# Patient Record
Sex: Male | Born: 1950
Health system: Southern US, Community
[De-identification: ages and names within clinical notes are randomized; demographics above are authoritative.]

## PROBLEM LIST (undated history)

## (undated) DIAGNOSIS — K567 Ileus, unspecified: Secondary | ICD-10-CM

## (undated) DIAGNOSIS — M1990 Unspecified osteoarthritis, unspecified site: Secondary | ICD-10-CM

## (undated) DIAGNOSIS — G4733 Obstructive sleep apnea (adult) (pediatric): Secondary | ICD-10-CM

## (undated) DIAGNOSIS — Q859 Phakomatosis, unspecified: Secondary | ICD-10-CM

## (undated) DIAGNOSIS — D638 Anemia in other chronic diseases classified elsewhere: Secondary | ICD-10-CM

## (undated) DIAGNOSIS — M722 Plantar fascial fibromatosis: Secondary | ICD-10-CM

## (undated) DIAGNOSIS — K219 Gastro-esophageal reflux disease without esophagitis: Secondary | ICD-10-CM

## (undated) DIAGNOSIS — S46119A Strain of muscle, fascia and tendon of long head of biceps, unspecified arm, initial encounter: Secondary | ICD-10-CM

## (undated) DIAGNOSIS — K648 Other hemorrhoids: Secondary | ICD-10-CM

## (undated) DIAGNOSIS — M75101 Unspecified rotator cuff tear or rupture of right shoulder, not specified as traumatic: Secondary | ICD-10-CM

## (undated) DIAGNOSIS — Z86718 Personal history of other venous thrombosis and embolism: Secondary | ICD-10-CM

## (undated) HISTORY — DX: Unspecified osteoarthritis, unspecified site: M19.90

## (undated) HISTORY — DX: Plantar fascial fibromatosis: M72.2

## (undated) HISTORY — DX: Strain of muscle, fascia and tendon of long head of biceps, unspecified arm, initial encounter: S46.119A

## (undated) HISTORY — DX: Other hemorrhoids: K64.8

## (undated) HISTORY — DX: Unspecified rotator cuff tear or rupture of right shoulder, not specified as traumatic: M75.101

## (undated) HISTORY — PX: APPENDECTOMY: SHX54

## (undated) HISTORY — DX: Obstructive sleep apnea (adult) (pediatric): G47.33

## (undated) HISTORY — DX: Phakomatosis, unspecified: Q85.9

## (undated) HISTORY — DX: Gastro-esophageal reflux disease without esophagitis: K21.9

## (undated) HISTORY — PX: DENTAL SURGERY: SHX609

## (undated) HISTORY — DX: Ileus, unspecified: K56.7

## (undated) HISTORY — DX: Personal history of other venous thrombosis and embolism: Z86.718

## (undated) HISTORY — DX: Anemia in other chronic diseases classified elsewhere: D63.8

---

## 1984-04-17 HISTORY — PX: ORIF ULNAR FRACTURE: SHX5417

## 2000-12-07 ENCOUNTER — Emergency Department (HOSPITAL_COMMUNITY): Admission: EM | Admit: 2000-12-07 | Discharge: 2000-12-07 | Payer: Self-pay | Admitting: Emergency Medicine

## 2004-07-01 ENCOUNTER — Ambulatory Visit: Payer: Self-pay | Admitting: Internal Medicine

## 2004-12-15 ENCOUNTER — Ambulatory Visit: Payer: Self-pay | Admitting: Internal Medicine

## 2004-12-26 ENCOUNTER — Ambulatory Visit: Payer: Self-pay | Admitting: Internal Medicine

## 2005-03-14 ENCOUNTER — Ambulatory Visit: Payer: Self-pay | Admitting: Internal Medicine

## 2005-03-20 ENCOUNTER — Ambulatory Visit: Payer: Self-pay | Admitting: Internal Medicine

## 2005-03-22 ENCOUNTER — Ambulatory Visit: Payer: Self-pay | Admitting: Internal Medicine

## 2005-06-02 ENCOUNTER — Ambulatory Visit: Payer: Self-pay | Admitting: Internal Medicine

## 2005-09-15 ENCOUNTER — Ambulatory Visit: Payer: Self-pay | Admitting: Internal Medicine

## 2005-10-25 ENCOUNTER — Ambulatory Visit: Payer: Self-pay | Admitting: Pulmonary Disease

## 2005-11-06 ENCOUNTER — Ambulatory Visit (HOSPITAL_BASED_OUTPATIENT_CLINIC_OR_DEPARTMENT_OTHER): Admission: RE | Admit: 2005-11-06 | Discharge: 2005-11-06 | Payer: Self-pay | Admitting: Pulmonary Disease

## 2005-11-06 ENCOUNTER — Encounter: Payer: Self-pay | Admitting: Pulmonary Disease

## 2005-11-14 ENCOUNTER — Ambulatory Visit: Payer: Self-pay | Admitting: Pulmonary Disease

## 2005-11-17 ENCOUNTER — Ambulatory Visit: Payer: Self-pay | Admitting: Pulmonary Disease

## 2005-12-08 ENCOUNTER — Ambulatory Visit: Payer: Self-pay | Admitting: Pulmonary Disease

## 2006-03-22 ENCOUNTER — Ambulatory Visit: Payer: Self-pay | Admitting: Internal Medicine

## 2006-03-22 LAB — CONVERTED CEMR LAB
ALT: 39 units/L (ref 0–40)
AST: 27 units/L (ref 0–37)
Albumin: 3.8 g/dL (ref 3.5–5.2)
Alkaline Phosphatase: 65 units/L (ref 39–117)
BUN: 20 mg/dL (ref 6–23)
Basophils Absolute: 0 10*3/uL (ref 0.0–0.1)
Basophils Relative: 0.7 % (ref 0.0–1.0)
Bilirubin Urine: NEGATIVE
CO2: 29 meq/L (ref 19–32)
Calcium: 9.6 mg/dL (ref 8.4–10.5)
Chloride: 107 meq/L (ref 96–112)
Chol/HDL Ratio, serum: 3.3
Cholesterol: 212 mg/dL (ref 0–200)
Creatinine, Ser: 1.1 mg/dL (ref 0.4–1.5)
Eosinophil percent: 6.7 % — ABNORMAL HIGH (ref 0.0–5.0)
GFR calc non Af Amer: 74 mL/min
Glomerular Filtration Rate, Af Am: 90 mL/min/{1.73_m2}
Glucose, Bld: 82 mg/dL (ref 70–99)
HCT: 42.4 % (ref 39.0–52.0)
HDL: 65.1 mg/dL (ref >39.0)
Hemoglobin, Urine: NEGATIVE
Hemoglobin: 13.9 g/dL (ref 13.0–17.0)
Ketones, ur: NEGATIVE mg/dL
LDL DIRECT: 141.4 mg/dL
Leukocytes, UA: NEGATIVE
Lymphocytes Relative: 46 % (ref 12.0–46.0)
MCHC: 32.9 g/dL (ref 30.0–36.0)
MCV: 87.1 fL (ref 78.0–100.0)
Monocytes Absolute: 0.5 10*3/uL (ref 0.2–0.7)
Monocytes Relative: 13 % — ABNORMAL HIGH (ref 3.0–11.0)
Neutro Abs: 1.2 10*3/uL — ABNORMAL LOW (ref 1.4–7.7)
Neutrophils Relative %: 33.6 % — ABNORMAL LOW (ref 43.0–77.0)
Nitrite: NEGATIVE
PSA: 0.48 ng/mL (ref 0.10–4.00)
Platelets: 293 10*3/uL (ref 150–400)
Potassium: 4.2 meq/L (ref 3.5–5.1)
RBC: 4.87 M/uL (ref 4.22–5.81)
RDW: 12.8 % (ref 11.5–14.6)
Sodium: 140 meq/L (ref 135–145)
Specific Gravity, Urine: 1.025 (ref 1.000–1.03)
TSH: 1.24 microintl units/mL (ref 0.35–5.50)
Total Bilirubin: 1.1 mg/dL (ref 0.3–1.2)
Total Protein, Urine: NEGATIVE mg/dL
Total Protein: 6.9 g/dL (ref 6.0–8.3)
Triglyceride fasting, serum: 38 mg/dL (ref 0–149)
Urine Glucose: NEGATIVE mg/dL
Urobilinogen, UA: 0.2 (ref 0.0–1.0)
VLDL: 8 mg/dL (ref 0–40)
WBC: 3.6 10*3/uL — ABNORMAL LOW (ref 4.5–10.5)
pH: 7 (ref 5.0–8.0)

## 2006-03-27 ENCOUNTER — Ambulatory Visit: Payer: Self-pay | Admitting: Internal Medicine

## 2006-09-06 ENCOUNTER — Ambulatory Visit: Payer: Self-pay | Admitting: Internal Medicine

## 2006-09-11 ENCOUNTER — Ambulatory Visit: Payer: Self-pay | Admitting: Internal Medicine

## 2006-09-11 LAB — CONVERTED CEMR LAB
ALT: 27 units/L (ref 0–40)
AST: 25 units/L (ref 0–37)
Albumin: 3.9 g/dL (ref 3.5–5.2)
Alkaline Phosphatase: 64 units/L (ref 39–117)
BUN: 23 mg/dL (ref 6–23)
Basophils Absolute: 0 10*3/uL (ref 0.0–0.1)
Basophils Relative: 0.5 % (ref 0.0–1.0)
Bilirubin Urine: NEGATIVE
Bilirubin, Direct: 0.1 mg/dL (ref 0.0–0.3)
CO2: 31 meq/L (ref 19–32)
Calcium: 9.8 mg/dL (ref 8.4–10.5)
Chloride: 108 meq/L (ref 96–112)
Creatinine, Ser: 1.1 mg/dL (ref 0.4–1.5)
Eosinophils Absolute: 0.4 10*3/uL (ref 0.0–0.6)
Eosinophils Relative: 7.8 % — ABNORMAL HIGH (ref 0.0–5.0)
GFR calc Af Amer: 90 mL/min
GFR calc non Af Amer: 74 mL/min
Glucose, Bld: 88 mg/dL (ref 70–99)
HCT: 40.8 % (ref 39.0–52.0)
Hemoglobin, Urine: NEGATIVE
Hemoglobin: 13.7 g/dL (ref 13.0–17.0)
Leukocytes, UA: NEGATIVE
Lymphocytes Relative: 31.8 % (ref 12.0–46.0)
MCHC: 33.7 g/dL (ref 30.0–36.0)
MCV: 84.8 fL (ref 78.0–100.0)
Monocytes Absolute: 0.5 10*3/uL (ref 0.2–0.7)
Monocytes Relative: 10.6 % (ref 3.0–11.0)
Neutro Abs: 2.3 10*3/uL (ref 1.4–7.7)
Neutrophils Relative %: 49.3 % (ref 43.0–77.0)
Nitrite: NEGATIVE
Platelets: 281 10*3/uL (ref 150–400)
Potassium: 3.7 meq/L (ref 3.5–5.1)
RBC: 4.8 M/uL (ref 4.22–5.81)
RDW: 13 % (ref 11.5–14.6)
Sodium: 144 meq/L (ref 135–145)
Specific Gravity, Urine: >=1.03 (ref 1.000–1.03)
TSH: 0.87 microintl units/mL (ref 0.35–5.50)
Total Bilirubin: 1.2 mg/dL (ref 0.3–1.2)
Total CK: 183 units/L (ref 7–195)
Total Protein, Urine: NEGATIVE mg/dL
Total Protein: 7.2 g/dL (ref 6.0–8.3)
Urine Glucose: NEGATIVE mg/dL
Urobilinogen, UA: 0.2 (ref 0.0–1.0)
WBC: 4.7 10*3/uL (ref 4.5–10.5)
pH: 5.5 (ref 5.0–8.0)

## 2006-10-05 ENCOUNTER — Ambulatory Visit: Payer: Self-pay | Admitting: Internal Medicine

## 2006-11-21 ENCOUNTER — Ambulatory Visit: Payer: Self-pay | Admitting: Gastroenterology

## 2006-11-28 ENCOUNTER — Ambulatory Visit: Payer: Self-pay | Admitting: Gastroenterology

## 2006-11-28 LAB — HM COLONOSCOPY: HM Colonoscopy: NORMAL

## 2007-04-16 ENCOUNTER — Ambulatory Visit: Payer: Self-pay | Admitting: Internal Medicine

## 2007-04-16 LAB — CONVERTED CEMR LAB
ALT: 28 units/L (ref 0–53)
AST: 23 units/L (ref 0–37)
Albumin: 3.4 g/dL — ABNORMAL LOW (ref 3.5–5.2)
Alkaline Phosphatase: 62 units/L (ref 39–117)
BUN: 15 mg/dL (ref 6–23)
Basophils Absolute: 0 10*3/uL (ref 0.0–0.1)
Basophils Relative: 0.8 % (ref 0.0–1.0)
Bilirubin Urine: NEGATIVE
Bilirubin, Direct: 0.1 mg/dL (ref 0.0–0.3)
CO2: 31 meq/L (ref 19–32)
Calcium: 9.3 mg/dL (ref 8.4–10.5)
Chloride: 108 meq/L (ref 96–112)
Cholesterol: 171 mg/dL (ref 0–200)
Creatinine, Ser: 1 mg/dL (ref 0.4–1.5)
Eosinophils Absolute: 0.3 10*3/uL (ref 0.0–0.6)
Eosinophils Relative: 5.4 % — ABNORMAL HIGH (ref 0.0–5.0)
GFR calc Af Amer: 100 mL/min
GFR calc non Af Amer: 83 mL/min
Glucose, Bld: 90 mg/dL (ref 70–99)
HCT: 35.7 % — ABNORMAL LOW (ref 39.0–52.0)
HDL: 43.7 mg/dL (ref >39.0)
Hemoglobin, Urine: NEGATIVE
Hemoglobin: 12.1 g/dL — ABNORMAL LOW (ref 13.0–17.0)
Ketones, ur: NEGATIVE mg/dL
LDL Cholesterol: 118 mg/dL — ABNORMAL HIGH (ref 0–99)
Leukocytes, UA: NEGATIVE
Lymphocytes Relative: 34 % (ref 12.0–46.0)
MCHC: 33.8 g/dL (ref 30.0–36.0)
MCV: 86.1 fL (ref 78.0–100.0)
Monocytes Absolute: 0.7 10*3/uL (ref 0.2–0.7)
Monocytes Relative: 15.6 % — ABNORMAL HIGH (ref 3.0–11.0)
Neutro Abs: 2.1 10*3/uL (ref 1.4–7.7)
Neutrophils Relative %: 44.2 % (ref 43.0–77.0)
Nitrite: NEGATIVE
PSA: 0.47 ng/mL (ref 0.10–4.00)
Platelets: 260 10*3/uL (ref 150–400)
Potassium: 4.1 meq/L (ref 3.5–5.1)
RBC: 4.15 M/uL — ABNORMAL LOW (ref 4.22–5.81)
RDW: 13.5 % (ref 11.5–14.6)
Sodium: 143 meq/L (ref 135–145)
Specific Gravity, Urine: 1.025 (ref 1.000–1.03)
TSH: 1.17 microintl units/mL (ref 0.35–5.50)
Total Bilirubin: 0.9 mg/dL (ref 0.3–1.2)
Total CHOL/HDL Ratio: 3.9
Total Protein, Urine: NEGATIVE mg/dL
Total Protein: 6.6 g/dL (ref 6.0–8.3)
Triglycerides: 46 mg/dL (ref 0–149)
Urine Glucose: NEGATIVE mg/dL
Urobilinogen, UA: 0.2 (ref 0.0–1.0)
VLDL: 9 mg/dL (ref 0–40)
WBC: 4.7 10*3/uL (ref 4.5–10.5)
pH: 6 (ref 5.0–8.0)

## 2007-04-17 DIAGNOSIS — G4733 Obstructive sleep apnea (adult) (pediatric): Secondary | ICD-10-CM | POA: Insufficient documentation

## 2007-04-22 ENCOUNTER — Ambulatory Visit: Payer: Self-pay | Admitting: Internal Medicine

## 2007-04-22 DIAGNOSIS — K649 Unspecified hemorrhoids: Secondary | ICD-10-CM | POA: Insufficient documentation

## 2007-04-22 DIAGNOSIS — M722 Plantar fascial fibromatosis: Secondary | ICD-10-CM | POA: Insufficient documentation

## 2007-09-18 ENCOUNTER — Ambulatory Visit: Payer: Self-pay | Admitting: Internal Medicine

## 2007-09-18 DIAGNOSIS — K219 Gastro-esophageal reflux disease without esophagitis: Secondary | ICD-10-CM | POA: Insufficient documentation

## 2007-09-18 DIAGNOSIS — D638 Anemia in other chronic diseases classified elsewhere: Secondary | ICD-10-CM

## 2007-09-18 DIAGNOSIS — M79609 Pain in unspecified limb: Secondary | ICD-10-CM | POA: Insufficient documentation

## 2007-09-18 DIAGNOSIS — R5383 Other fatigue: Secondary | ICD-10-CM | POA: Insufficient documentation

## 2007-09-18 DIAGNOSIS — D649 Anemia, unspecified: Secondary | ICD-10-CM | POA: Insufficient documentation

## 2007-09-18 DIAGNOSIS — R5381 Other malaise: Secondary | ICD-10-CM | POA: Insufficient documentation

## 2007-09-19 ENCOUNTER — Ambulatory Visit: Payer: Self-pay | Admitting: Internal Medicine

## 2007-09-19 LAB — CONVERTED CEMR LAB: Vit D, 1,25-Dihydroxy: 55 (ref 30–89)

## 2007-09-20 LAB — CONVERTED CEMR LAB
ALT: 28 units/L (ref 0–53)
AST: 33 units/L (ref 0–37)
Albumin: 3.5 g/dL (ref 3.5–5.2)
Alkaline Phosphatase: 72 units/L (ref 39–117)
BUN: 13 mg/dL (ref 6–23)
Basophils Relative: 2.1 % — ABNORMAL HIGH (ref 0.0–1.0)
Bilirubin Urine: NEGATIVE
CO2: 30 meq/L (ref 19–32)
Chloride: 105 meq/L (ref 96–112)
Creatinine, Ser: 1.1 mg/dL (ref 0.4–1.5)
Eosinophils Relative: 11.1 % — ABNORMAL HIGH (ref 0.0–5.0)
GFR calc non Af Amer: 74 mL/min
Glucose, Bld: 89 mg/dL (ref 70–99)
HCT: 37.6 % — ABNORMAL LOW (ref 39.0–52.0)
Hemoglobin: 12.8 g/dL — ABNORMAL LOW (ref 13.0–17.0)
Leukocytes, UA: NEGATIVE
Lymphocytes Relative: 43 % (ref 12.0–46.0)
Monocytes Absolute: 0.4 10*3/uL (ref 0.1–1.0)
Monocytes Relative: 11.6 % (ref 3.0–12.0)
Neutro Abs: 1.1 10*3/uL — ABNORMAL LOW (ref 1.4–7.7)
Nitrite: NEGATIVE
Potassium: 4.9 meq/L (ref 3.5–5.1)
RBC: 4.36 M/uL (ref 4.22–5.81)
Sed Rate: 10 mm/hr (ref 0–16)
Specific Gravity, Urine: 1.015 (ref 1.000–1.03)
TSH: 0.86 microintl units/mL (ref 0.35–5.50)
Total Protein, Urine: NEGATIVE mg/dL
Vitamin B-12: 484 pg/mL (ref 211–911)
WBC: 3.3 10*3/uL — ABNORMAL LOW (ref 4.5–10.5)
pH: 8 (ref 5.0–8.0)

## 2007-10-09 ENCOUNTER — Ambulatory Visit: Payer: Self-pay | Admitting: Internal Medicine

## 2007-10-09 DIAGNOSIS — M1710 Unilateral primary osteoarthritis, unspecified knee: Secondary | ICD-10-CM | POA: Insufficient documentation

## 2007-10-09 DIAGNOSIS — IMO0002 Reserved for concepts with insufficient information to code with codable children: Secondary | ICD-10-CM

## 2007-10-09 DIAGNOSIS — Z8711 Personal history of peptic ulcer disease: Secondary | ICD-10-CM | POA: Insufficient documentation

## 2007-10-11 LAB — CONVERTED CEMR LAB
Basophils Relative: 1 % (ref 0.0–1.0)
Eosinophils Relative: 11.3 % — ABNORMAL HIGH (ref 0.0–5.0)
HCT: 37.3 % — ABNORMAL LOW (ref 39.0–52.0)
Monocytes Absolute: 0.5 10*3/uL (ref 0.1–1.0)
Monocytes Relative: 13.8 % — ABNORMAL HIGH (ref 3.0–12.0)
Neutrophils Relative %: 33.7 % — ABNORMAL LOW (ref 43.0–77.0)
Platelets: 257 10*3/uL (ref 150–400)
RBC: 4.31 M/uL (ref 4.22–5.81)
WBC: 3.4 10*3/uL — ABNORMAL LOW (ref 4.5–10.5)

## 2008-01-20 ENCOUNTER — Ambulatory Visit: Payer: Self-pay | Admitting: Internal Medicine

## 2008-01-20 DIAGNOSIS — H8309 Labyrinthitis, unspecified ear: Secondary | ICD-10-CM | POA: Insufficient documentation

## 2008-01-20 LAB — CONVERTED CEMR LAB
CO2: 28 meq/L (ref 19–32)
Glucose, Bld: 101 mg/dL — ABNORMAL HIGH (ref 70–99)
Potassium: 4.5 meq/L (ref 3.5–5.1)
Sodium: 143 meq/L (ref 135–145)

## 2008-01-22 ENCOUNTER — Encounter: Payer: Self-pay | Admitting: Internal Medicine

## 2008-01-23 ENCOUNTER — Encounter: Admission: RE | Admit: 2008-01-23 | Discharge: 2008-01-23 | Payer: Self-pay | Admitting: Internal Medicine

## 2008-01-26 ENCOUNTER — Encounter: Admission: RE | Admit: 2008-01-26 | Discharge: 2008-01-26 | Payer: Self-pay | Admitting: Internal Medicine

## 2008-01-30 ENCOUNTER — Telehealth: Payer: Self-pay | Admitting: Internal Medicine

## 2008-08-19 ENCOUNTER — Telehealth: Payer: Self-pay | Admitting: Internal Medicine

## 2008-11-23 ENCOUNTER — Encounter: Payer: Self-pay | Admitting: Internal Medicine

## 2008-11-26 ENCOUNTER — Ambulatory Visit: Payer: Self-pay | Admitting: Internal Medicine

## 2008-11-26 LAB — CONVERTED CEMR LAB
Alkaline Phosphatase: 81 units/L (ref 39–117)
BUN: 28 mg/dL — ABNORMAL HIGH (ref 6–23)
Basophils Relative: 0.3 % (ref 0.0–3.0)
Bilirubin Urine: NEGATIVE
Bilirubin, Direct: 0.1 mg/dL (ref 0.0–0.3)
CO2: 30 meq/L (ref 19–32)
Chloride: 109 meq/L (ref 96–112)
Eosinophils Absolute: 0.3 10*3/uL (ref 0.0–0.7)
Eosinophils Relative: 6.7 % — ABNORMAL HIGH (ref 0.0–5.0)
Glucose, Bld: 80 mg/dL (ref 70–99)
LDL Cholesterol: 127 mg/dL — ABNORMAL HIGH (ref 0–99)
Lymphocytes Relative: 33.2 % (ref 12.0–46.0)
MCHC: 33.4 g/dL (ref 30.0–36.0)
Monocytes Relative: 10.8 % (ref 3.0–12.0)
Neutrophils Relative %: 49 % (ref 43.0–77.0)
Nitrite: NEGATIVE
Potassium: 4.8 meq/L (ref 3.5–5.1)
RBC: 4.39 M/uL (ref 4.22–5.81)
Total CHOL/HDL Ratio: 4
Total Protein, Urine: NEGATIVE mg/dL
Total Protein: 7.1 g/dL (ref 6.0–8.3)
VLDL: 8.2 mg/dL (ref 0.0–40.0)
WBC: 4 10*3/uL — ABNORMAL LOW (ref 4.5–10.5)
pH: 6 (ref 5.0–8.0)

## 2008-12-03 ENCOUNTER — Ambulatory Visit: Payer: Self-pay | Admitting: Internal Medicine

## 2008-12-11 ENCOUNTER — Ambulatory Visit: Payer: Self-pay | Admitting: Internal Medicine

## 2008-12-11 DIAGNOSIS — L089 Local infection of the skin and subcutaneous tissue, unspecified: Secondary | ICD-10-CM | POA: Insufficient documentation

## 2009-05-31 ENCOUNTER — Encounter: Payer: Self-pay | Admitting: Internal Medicine

## 2009-11-29 ENCOUNTER — Ambulatory Visit: Payer: Self-pay | Admitting: Internal Medicine

## 2009-11-29 LAB — CONVERTED CEMR LAB
AST: 22 units/L (ref 0–37)
Alkaline Phosphatase: 55 units/L (ref 39–117)
BUN: 21 mg/dL (ref 6–23)
Basophils Absolute: 0 10*3/uL (ref 0.0–0.1)
Calcium: 9.6 mg/dL (ref 8.4–10.5)
Cholesterol: 168 mg/dL (ref 0–200)
Creatinine, Ser: 1 mg/dL (ref 0.4–1.5)
GFR calc non Af Amer: 95.65 mL/min (ref >60)
Glucose, Bld: 79 mg/dL (ref 70–99)
HDL: 48.2 mg/dL (ref >39.00)
Lymphocytes Relative: 38 % (ref 12.0–46.0)
Monocytes Relative: 12.9 % — ABNORMAL HIGH (ref 3.0–12.0)
Nitrite: NEGATIVE
Platelets: 253 10*3/uL (ref 150.0–400.0)
RDW: 14.1 % (ref 11.5–14.6)
Sodium: 142 meq/L (ref 135–145)
Specific Gravity, Urine: 1.015 (ref 1.000–1.030)
TSH: 0.72 microintl units/mL (ref 0.35–5.50)
Total Bilirubin: 0.8 mg/dL (ref 0.3–1.2)
Total Protein, Urine: NEGATIVE mg/dL
Urine Glucose: NEGATIVE mg/dL
Urobilinogen, UA: 0.2 (ref 0.0–1.0)
VLDL: 7 mg/dL (ref 0.0–40.0)

## 2009-12-06 ENCOUNTER — Ambulatory Visit: Payer: Self-pay | Admitting: Internal Medicine

## 2009-12-06 ENCOUNTER — Encounter: Payer: Self-pay | Admitting: Internal Medicine

## 2009-12-06 LAB — CONVERTED CEMR LAB: Testosterone: 531.28 ng/dL (ref 350.00–890.00)

## 2009-12-13 ENCOUNTER — Ambulatory Visit: Payer: Self-pay | Admitting: Pulmonary Disease

## 2010-03-29 ENCOUNTER — Ambulatory Visit: Payer: Self-pay | Admitting: Internal Medicine

## 2010-04-05 ENCOUNTER — Encounter
Admission: RE | Admit: 2010-04-05 | Discharge: 2010-04-18 | Payer: Self-pay | Source: Home / Self Care | Attending: Internal Medicine | Admitting: Internal Medicine

## 2010-04-08 ENCOUNTER — Telehealth: Payer: Self-pay | Admitting: Internal Medicine

## 2010-04-12 ENCOUNTER — Encounter: Payer: Self-pay | Admitting: Internal Medicine

## 2010-05-17 NOTE — Assessment & Plan Note (Signed)
Summary: consult or management of osa   Copy to:  Illene Regulus Primary Provider/Referring Provider:  Jacques Navy MD  CC:  Sleep Consult.  History of Present Illness: The pt is a 60y/o male who I have been asked to see for management of osa.  He was diagnosed with mild to moderate osa in 2007, with AHI of 17/hr and desat as low as 86%.  He was initiated on cpap, but had issues with adaptation.  The pt states that he would awaken and the mask would be off his face.  He states that it would "pop off", but couldn't exclude the possibility that he pulled off.  He denies any issues with pressure tolerance.  The pt was lost to f/u in 2007, and has not worn cpap in 3 years.  He is still snoring and having abnormal breathing pattern during sleep, and has nonrestorative sleep despite getting 7+hrs a night.  He has frequent awakenings for unknown reasons, but does get back to sleep quickly.  He notes definite sleep pressure during periods of inactivity while at work, and feels that it is affecting his concentration and memory.  He also noted some sleepiness with driving.  His epworth scale today is abnormal at 20.  His weight by our scales is up 20 pounds from 2007, but he is wearing heavy steel shanked boots.  Medications Prior to Update: 1)  Vitamin D3 1000 Unit  Tabs (Cholecalciferol) .Marland Kitchen.. 1 Qd 2)  Omega-3 350 Mg  Caps (Omega-3 Fatty Acids) .... Once Daily 3)  Glucosamine 1500 Complex  Caps (Glucosamine-Chondroit-Vit C-Mn) .... Take 1 Tablet By Mouth Two Times A Day 4)  Aspirin Low Dose 81 Mg Tabs (Aspirin) .Marland Kitchen.. 1 By Mouth Once Daily 5)  Promethazine Hcl 12.5 Mg Tabs (Promethazine Hcl) .Marland Kitchen.. 1 By Mouth Q 6 As Needed Nausea 6)  Anucort-Hc 25 Mg Supp (Hydrocortisone Acetate) .Marland Kitchen.. 1 Per Rectum Two Times A Day X 6 Days For Hemorrhoidal Flares.  Allergies (verified): No Known Drug Allergies  Past History:  Past Medical History: H/o RMSF INFECTION, SKIN AND SOFT TISSUE (ICD-686.9) LABYRINTHITIS,  ACUTE (ICD-386.30) HELICOBACTER PYLORI INFECTION, HX OF (ICD-V12.71)------------H. pilori(+) 2009 ARTHRITIS, LEFT KNEE (ICD-716.96) GERD (ICD-530.81) ANEMIA OF OTHER CHRONIC DISEASE (ICD-285.29) PLANTAR FASCIITIS (ICD-728.71) HEMORRHOIDS, NOS (ICD-455.6) OBSTRUCTIVE SLEEP APNEA (ICD-327.23)--AHI 17/hr in 2007     Past Surgical History: Colonoscopy-11/28/2006 L arm surgery 1985  Family History: Reviewed history from 12/03/2008 and no changes required. Mother deceased 64, leukemia Father deceased 34, fire  alcholism Multiple CA on mothers side:   Maunt dec 75, colon CA   Muncle dec 56, esophageal CA   Other cancers on maternal side unspecified.    Social History: Reviewed history from 12/03/2008 and no changes required. Occupation: [employer] Married '72-'87, divorced; '96-'00, divorced.   Lives alone. pt has children Current relationship status: on/off. No complaint of sexual dysfxn.     Never Smoked EtOH: <1/week. Regular exercise-yes, tennis 3x/week.  Resistance training.  Review of Systems       The patient complains of anxiety, hand/feet swelling, and joint stiffness or pain.  The patient denies shortness of breath with activity, shortness of breath at rest, productive cough, non-productive cough, coughing up blood, chest pain, irregular heartbeats, acid heartburn, indigestion, loss of appetite, weight change, abdominal pain, difficulty swallowing, sore throat, tooth/dental problems, headaches, nasal congestion/difficulty breathing through nose, sneezing, itching, ear ache, depression, rash, change in color of mucus, and fever.    Vital Signs:  Patient profile:  60 year old male Height:      71 inches Weight:      195.25 pounds BMI:     27.33 O2 Sat:      97 % on Room air Temp:     98.2 degrees F oral Pulse rate:   62 / minute BP sitting:   150 / 84  (left arm) Cuff size:   regular  Vitals Entered By: Arman Filter LPN (December 13, 2009 3:15 PM)  O2 Flow:   Room air CC: Sleep Consult Comments Medications reviewed with patient Arman Filter LPN  December 13, 2009 3:24 PM    Physical Exam  General:  wd male in nad Eyes:  PERRLA and EOMI.   Nose:  patent without discharge Mouth:  normal uvula and palate, no exudates. Neck:  no jvd, tmg, LN Lungs:  clear to auscultation Heart:  rrr, no mrg Abdomen:  benign Extremities:  no edema or cyanosis  pulses intact distally Neurologic:  alert and oriented, moves all 4.   Impression & Recommendations:  Problem # 1:  OBSTRUCTIVE SLEEP APNEA (ICD-327.23) the pt has a h/o mild to moderate osa, but had difficulties with cpap tolerance.  He is willing to try cpap again, but is not overly enthused.  I would hope that he can find one of the newer masks which may be more tolerable.  He would like to try cpap again at home with his machine prior to re-optimizing pressure with an auto device.  I have also reviewed the role of dental appliance, and have given the pt literature on this.  He really does not have anatomical abnl that are  amenable to minor surgery.  He could consider mandibular advancement, but he and I both think this is overly aggressive.  He will give me some feedback after he retries cpap.  Other Orders: Consultation Level IV ([id number])  Patient Instructions: 1)  will start back on cpap as a trial.  Please call if tolerance issues.  We can try mirage fx nasal mask if you would like. 2)  please review material on dental appliance, and let me know if you would like to consider. 3)  please call me with progress.

## 2010-05-17 NOTE — Assessment & Plan Note (Signed)
Summary: PHYSICAL-STC   Vital Signs:  Patient profile:   60 year old male Height:      71 inches (180.34 cm) Weight:      185.50 pounds (84.32 kg) BMI:     25.97 O2 Sat:      98 % on Room air Temp:     98.4 degrees F (36.89 degrees C) oral Pulse rate:   62 / minute BP sitting:   118 / 80  (left arm) Cuff size:   regular  Vitals Entered By: Lucious Groves CMA (December 06, 2009 10:11 AM)  O2 Flow:  Room air CC: CPX./kb Is Patient Diabetic? No Pain Assessment Patient in pain? no      Comments Per patient no refills needed at this time./kb   Primary Care Provider:  Plotnikov  CC:  CPX./kb.  History of Present Illness: Mr. Vath is a 60 yo healthy-appearing AA male presenting for his annual complete physical exam. He complains of chronic fatigue and hemorrhoids.  Patient states he feels tired throughout the week, more pronounced on week days. He describes himself as having lower drive and motivation than in the past. He expresses concern it may be low testosterone levels. He expresses extreme job dissatisfaction.  Mr. Turpin states he sees blood on tissue paper after wiping, but never in the stool and never in his underwear. He reports no recent changes in stool caliber. He states the blood on tissue paper is seen especially after difficult to pass stool. He frequently has difficulty passing his stool.   Current Medications (verified): 1)  Vitamin D3 1000 Unit  Tabs (Cholecalciferol) .Marland Kitchen.. 1 Qd 2)  Omega-3 350 Mg  Caps (Omega-3 Fatty Acids) .... Once Daily 3)  Glucosamine 1500 Complex  Caps (Glucosamine-Chondroit-Vit C-Mn) .... Take 1 Tablet By Mouth Two Times A Day 4)  Aspirin Low Dose 81 Mg Tabs (Aspirin) .Marland Kitchen.. 1 By Mouth Once Daily 5)  Promethazine Hcl 12.5 Mg Tabs (Promethazine Hcl) .Marland Kitchen.. 1 By Mouth Q 6 As Needed Nausea  Allergies (verified): No Known Drug Allergies  Past History:  Past Medical History: Last updated: 10/09/2007 Hemorroids H/o OSA - resolved H/o  RMSF H. pilori(+) 2009  Past Surgical History: Last updated: 04/17/2007 Colonoscopy-11/28/2006  Family History: Last updated: 2008/12/08 Mother deceased 53, leukemia Father deceased 73, fire  alcholism Multiple CA on mothers side:   Maunt dec 75, colon CA   Muncle dec 56, esophageal CA   Other cancers on maternal side unspecified.    Social History: Last updated: 2008-12-08 Occupation: [employer] Married '72-'87, divorced; '96-'00, divorced.   Lives alone. Current relationship status: on/off. No complaint of sexual dysfxn.     Never Smoked EtOH: <1/week. Regular exercise-yes, tennis 3x/week.  Resistance training.  Review of Systems  The patient denies fever, weight loss, weight gain, chest pain, syncope, dyspnea on exertion, abdominal pain, melena, hematochezia, severe indigestion/heartburn, and hematuria.         positive - fatigue, muscle pain, situational depression  Physical Exam  General:  alert and healthy-appearing.   Head:  normocephalic and atraumatic.   Eyes:  pupils equal, pupils round, and pupils reactive to light.   Ears:  R ear normal, L ear normal, and no external deformities.   Nose:  no external deformity and no external erythema.   Mouth:  no gingival abnormalities and pharynx pink and moist.   Lungs:  normal respiratory effort, no accessory muscle use, no crackles, and no wheezes.   Heart:  normal rate, regular  rhythm, and no murmur.   Abdomen:  soft, non-tender, no distention, no masses, no hepatomegaly, and no splenomegaly.   Rectal:  internal hemorrhoid(s).  Nodule at 12 o'clock. Pulses:  R radial normal and L radial normal.   Extremities:  No edema. Neurologic:  alert & oriented X3, DTRs symmetrical and normal, and finger-to-nose normal.   Skin:  turgor normal, color normal, and no rashes.   Psych:  Oriented X3, normally interactive, good eye contact, and not anxious appearing.     Impression & Recommendations:  Problem # 1:  HEMORRHOIDS,  NOS (ICD-455.6) Assessment - Patient suffers internal hemorrhoids confirmed by digital rectal exam.  Plan - MiraLax to soften stools            Anusol HC 25mg  suppository used morning and night for 1 week.  Problem # 2:  FATIGUE (ICD-780.79) Assessment - Patient suffers from chronic fatigue likely multifactorial in origin. Contributing causes include obstructive sleep apnea which he was originally diagnosed and treated with 5 years ago but since has discontinued using his CPAP mask. The other contributing cause could be situationally-related depression due to the fact that he shows a great amount of dislike for his job. Patient is concerned his fatigue may be due to a low testosterone level though he does not present with other common findings such as decreased libido and decreased testicle size and firmness.  Plan - Order testosterone level.             Situationally-related depression - Patient counseled on the benefit of he may stand to gain if he proactively seeks change in his work           life. He was counseled on a strategy of how to approach Human Resources in hopes of changing jobs within the company. He was           also counseled on the benefits of short-term problem oriented focused counseling.    Orders: TLB-Testosterone, Total ([id number]-[id number])   Addendum - testosterone level 531 - normal  Problem # 3:  OBSTRUCTIVE SLEEP APNEA (ICD-327.23) Assessment - Patient diagnosed with OSA 5 years ago and treated with CPAP mask. After two months of aggravation with the mask the patient stopped using it. He currently suffers from chronic fatigue, OSA is likely to play a large role.   Plan - Refer to Marcelyn Bruins, MD for evaluation - Titration or diagnostic study.  Orders: Sleep Disorder Referral (Sleep Disorder)  Problem # 4:  Preventive Health Care (ICD-V70.0) Assessment - Patient had a colonoscopy 3 years ago. His PSA levels are within normal range. He expresses he does not like to  take the influenza vaccine.  Plan - Continue health management accordingly.   Td Booster: Tdap (12/03/2008)   Chol: 168 (11/29/2009)   HDL: 48.20 (11/29/2009)   LDL: 113 (11/29/2009)   TG: 35.0 (11/29/2009) TSH: 0.72 (11/29/2009)   PSA: 0.63 (11/29/2009)  Complete Medication List: 1)  Vitamin D3 1000 Unit Tabs (Cholecalciferol) .Marland Kitchen.. 1 qd 2)  Omega-3 350 Mg Caps (Omega-3 fatty acids) .... Once daily 3)  Glucosamine 1500 Complex Caps (Glucosamine-chondroit-vit c-mn) .... Take 1 tablet by mouth two times a day 4)  Aspirin Low Dose 81 Mg Tabs (Aspirin) .Marland Kitchen.. 1 by mouth once daily 5)  Promethazine Hcl 12.5 Mg Tabs (Promethazine hcl) .Marland Kitchen.. 1 by mouth q 6 as needed nausea 6)  Anucort-hc 25 Mg Supp (Hydrocortisone acetate) .Marland Kitchen.. 1 per rectum two times a day x 6 days for hemorrhoidal flares. Prescriptions:  ANUCORT-HC 25 MG SUPP (HYDROCORTISONE ACETATE) 1 per rectum two times a day x 6 days for hemorrhoidal flares.  #12 x 3   Entered and Authorized by:   Jacques Navy MD   Signed by:   Jacques Navy MD on 12/07/2009   Method used:   Electronically to        [facility]  [address] [phone]* (retail)       [address]       [place], [place]  [id number]       Ph: [phone] or [phone]       Fax: [phone]   RxID:   [id number]

## 2010-05-17 NOTE — Letter (Signed)
Summary: [facility]  [facility]   Imported By: Sherian Rein 06/05/2009 10:14:15  _____________________________________________________________________  External Attachment:    Type:   Image     Comment:   External Document

## 2010-05-19 NOTE — Assessment & Plan Note (Signed)
Summary: DIZZY SPELLS SINCE THANKSGIVING/ NAUSEA/ NO OTHER SYMPTOMS/NWS   Vital Signs:  Patient profile:   60 year old male Height:      71 inches Weight:      190 pounds BMI:     26.60 O2 Sat:      98 % on Room air Temp:     97.7 degrees F oral Pulse rate:   55 / minute BP sitting:   138 / 90  (left arm) Cuff size:   regular  Vitals Entered By: Bill Salinas CMA (March 29, 2010 10:33 AM)  O2 Flow:  Room air CC: ov for evaluation of dizzy spells, pt states when he has spells they last about 3 days at a time. Pt c/o nausea and vomitting with these spells/ ab   Primary Care Provider:  Jacques Navy MD  CC:  ov for evaluation of dizzy spells and pt states when he has spells they last about 3 days at a time. Pt c/o nausea and vomitting with these spells/ ab.  History of Present Illness: Patient presents for evaluation of dizziness. He has had three episodes over the past 3 weeks of sudden onset of dizziness and room spinging that actually woke him from sleep. He has had to stay in bed on two occasions due to symptoms. He has taken Bonine with only minimal relief.  Of note - patient was seen in '09 for similar symptoms. MRI/MRA brain at that time was reviewed and was normal without evidence of stroke or vascular disease or acoustic neuroma.   Current Medications (verified): 1)  Glucosamine 1500 Complex  Caps (Glucosamine-Chondroit-Vit C-Mn) .... Take 1 Tablet By Mouth Two Times A Day 2)  Aspirin Low Dose 81 Mg Tabs (Aspirin) .Marland Kitchen.. 1 By Mouth Once Daily 3)  Promethazine Hcl 12.5 Mg Tabs (Promethazine Hcl) .Marland Kitchen.. 1 By Mouth Q 6 As Needed Nausea 4)  Anucort-Hc 25 Mg Supp (Hydrocortisone Acetate) .Marland Kitchen.. 1 Per Rectum Two Times A Day X 6 Days For Hemorrhoidal Flares.  Allergies (verified): No Known Drug Allergies  Past History:  Past Medical History: Last updated: 12/13/2009 H/o RMSF INFECTION, SKIN AND SOFT TISSUE (ICD-686.9) LABYRINTHITIS, ACUTE (ICD-386.30) HELICOBACTER PYLORI  INFECTION, HX OF (ICD-V12.71)------------H. pilori(+) 2009 ARTHRITIS, LEFT KNEE (ICD-716.96) GERD (ICD-530.81) ANEMIA OF OTHER CHRONIC DISEASE (ICD-285.29) PLANTAR FASCIITIS (ICD-728.71) HEMORRHOIDS, NOS (ICD-455.6) OBSTRUCTIVE SLEEP APNEA (ICD-327.23)--AHI 17/hr in 2007     Past Surgical History: Last updated: 12/13/2009 Colonoscopy-11/28/2006 L arm surgery 1985  Family History: Last updated: 12-07-2008 Mother deceased 68, leukemia Father deceased 33, fire  alcholism Multiple CA on mothers side:   Maunt dec 75, colon CA   Muncle dec 56, esophageal CA   Other cancers on maternal side unspecified.    Social History: Last updated: 12/13/2009 Occupation: [employer] Married '72-'87, divorced; '96-'00, divorced.   Lives alone. pt has children Current relationship status: on/off. No complaint of sexual dysfxn.     Never Smoked EtOH: <1/week. Regular exercise-yes, tennis 3x/week.  Resistance training.  Review of Systems  The patient denies anorexia, fever, weight loss, weight gain, decreased hearing, hoarseness, chest pain, dyspnea on exertion, prolonged cough, hemoptysis, abdominal pain, muscle weakness, transient blindness, difficulty walking, abnormal bleeding, enlarged lymph nodes, and angioedema.    Physical Exam  General:  Well-developed,well-nourished,in no acute distress; alert,appropriate and cooperative throughout examination Head:  normocephalic and atraumatic.   Eyes:  vision grossly intact, pupils equal, pupils round, corneas and lenses clear, and no injection.   Ears:  External ear exam  shows no significant lesions or deformities.  Otoscopic examination reveals clear canals, tympanic membranes are intact bilaterally without bulging, retraction, inflammation or discharge. Hearing is grossly normal bilaterally. Neck:  supple and no masses.   Lungs:  normal respiratory effort.   Heart:  normal rate and regular rhythm.   Msk:  no joint tenderness and no joint  swelling.   Pulses:  2+ radial Neurologic:  alert & oriented X3, cranial nerves II-XII intact, strength normal in all extremities, gait normal, DTRs symmetrical and normal, finger-to-nose normal, heel-to-shin normal, and Romberg negative.  No dysdiadochokinesia, nl rapid finger movement, normal tandem gait. Skin:  cool and clammy hands (chronic conditition) Psych:  Oriented X3, normally interactive, good eye contact, and not anxious appearing.     Impression & Recommendations:  Problem # 1:  LABYRINTHITIS, ACUTE (ICD-386.30) Patient with recurrent labyrinthitis and a normal, non-focal neuro exam.  Plan - reviewed anatomy          recommended vestibular rehab          take bonine as needed          reassurance this wasn't a stroke and educated him to the warning signs of CVA  Orders: Rehabilitation Referral (Rehab)  Complete Medication List: 1)  Glucosamine 1500 Complex Caps (Glucosamine-chondroit-vit c-mn) .... Take 1 tablet by mouth two times a day 2)  Aspirin Low Dose 81 Mg Tabs (Aspirin) .Marland Kitchen.. 1 by mouth once daily 3)  Promethazine Hcl 12.5 Mg Tabs (Promethazine hcl) .Marland Kitchen.. 1 by mouth q 6 as needed nausea 4)  Anucort-hc 25 Mg Supp (Hydrocortisone acetate) .Marland Kitchen.. 1 per rectum two times a day x 6 days for hemorrhoidal flares.   Orders Added: 1)  Rehabilitation Referral [Rehab] 2)  Est. Patient Level IV [16109]

## 2010-05-19 NOTE — Miscellaneous (Signed)
Summary: Eval  for PT/[place]  Eval  for PT/[place]   Imported By: Sherian Rein 04/15/2010 12:26:19  _____________________________________________________________________  External Attachment:    Type:   Image     Comment:   External Document

## 2010-05-19 NOTE — Progress Notes (Signed)
Summary: REFERRAL?  Phone Note Call from Patient Call back at [phone]   Summary of Call: Pt was refered for vertigo and wants to know if we recieved results? I see rehab referral for vertigo but no testing. Pt continues to have dizzyness.  Initial call taken by: Lamar Sprinkles, CMA,  April 08, 2010 1:44 PM  Follow-up for Phone Call        Pt was seen by rehab, they could not bring on his symptoms w/the exercises. They advised for pt to see an ENT. Patient is requesting referral and would like to get in before the end of the year.  Follow-up by: Lamar Sprinkles, CMA,  April 08, 2010 3:18 PM  Additional Follow-up for Phone Call Additional follow up Details #1::        Had negative MRI several years ago but repeat study may be indicated. Will honor patient's wishes, as created by the staff at the rehab unit, to refer to ENT. [facility] notified.- will try for the walk in doc of the day next week. Additional Follow-up by: Jacques Navy MD,  April 08, 2010 3:39 PM    Additional Follow-up for Phone Call Additional follow up Details #2::    Pt informed  Follow-up by: Lamar Sprinkles, CMA,  April 08, 2010 4:45 PM

## 2010-05-31 ENCOUNTER — Telehealth (INDEPENDENT_AMBULATORY_CARE_PROVIDER_SITE_OTHER): Payer: Self-pay | Admitting: *Deleted

## 2010-06-01 ENCOUNTER — Encounter (INDEPENDENT_AMBULATORY_CARE_PROVIDER_SITE_OTHER): Payer: 59 | Admitting: Sports Medicine

## 2010-06-01 ENCOUNTER — Encounter: Payer: Self-pay | Admitting: Sports Medicine

## 2010-06-01 DIAGNOSIS — M79609 Pain in unspecified limb: Secondary | ICD-10-CM

## 2010-06-08 NOTE — Progress Notes (Signed)
  Phone Note Other Incoming   Request: Send information Summary of Call: Faxed 2007 sleep study to [facility] & [facility] Attn: Lynne Logan at [phone]

## 2010-06-08 NOTE — Assessment & Plan Note (Addendum)
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  Has dealt with it for many years, finally saw podiatrist and had custom orthotics which never helped.  Never did stretch exercises or had CSI into PF.  Has spent much money ( ~$1000) at [employer] on different products. Has only recently started to have better relief with most recent purchase. At present, has no morning pain.  His biggest issue prev was fairly significant heel pain after long tennis matches, but normally able to play through matches pretty well. Potentially interested in orthotics.  Allergies: No Known Drug Allergies  Past History:  Past Medical History: Last updated: 12/13/2009 H/o RMSF INFECTION, SKIN AND SOFT TISSUE (ICD-686.9) LABYRINTHITIS, ACUTE (ICD-386.30) HELICOBACTER PYLORI INFECTION, HX OF (ICD-V12.71)------------H. pilori(+) 2009 ARTHRITIS, LEFT KNEE (ICD-716.96) GERD (ICD-530.81) ANEMIA OF OTHER CHRONIC DISEASE (ICD-285.29) PLANTAR FASCIITIS (ICD-728.71) HEMORRHOIDS, NOS (ICD-455.6) OBSTRUCTIVE SLEEP APNEA (ICD-327.23)--AHI 17/hr in 2007     Past Surgical History: Last updated: 12/13/2009 Colonoscopy-11/28/2006 L arm surgery 1985  Family History: Last updated: 12-23-08 Mother deceased 69, leukemia Father deceased 20, fire  alcholism Multiple CA on mothers side:   Maunt dec 75, colon CA   Muncle dec 56, esophageal CA   Other cancers on maternal side unspecified.    Social History: Last updated: 12/13/2009 Occupation: [employer] Married '72-'87, divorced; '96-'00, divorced.   Lives alone. pt has children Current relationship status: on/off. No  complaint of sexual dysfxn.     Never Smoked EtOH: <1/week. Regular exercise-yes, tennis 3x/week.  Resistance training.  Review of Systems  The patient denies fever and weight loss.    Physical Exam  General:  Well-developed,well-nourished,in no acute distress; alert,appropriate and cooperative throughout examination Head:  normocephalic.   Eyes:  vision grossly intact.   Neck:  supple.   Lungs:  normal respiratory effort.   Msk:  Hips: good strength b/l  Leg lengths: equal  Feet: Rt heel with mild ttp at PF insertion on heel.  normal arch with no long or trans breakdown.  No metatarsalgia or callus.  Good great toe ROM. Neg PF stretch test. Lt heel: no ttp at PF insertion. Nl arch with no breakdown, no metatarsalgia pain.  Good great toe ROM.  neg PF stretch test. He has mild b/l calcaneal varus.  Nl PT function.  Ach tendon: mild ttp over mid Rt AT, no pump bump.  Gait: nl with level head, shoulders, and pelvis. No sig overpronation.  MSK [place]: RT PF 0.51 cm with some mild Ca++ deposit on trans view and mildly increased doppler flow. Rt AT 0.54 cm at base and 0.63 cm diam in midsubstance without evidence of defect. Pulses:  R posterior tibial normal, R dorsalis pedis normal, L posterior tibial normal, and L dorsalis pedis normal.   Neurologic:  alert & oriented X3.     Impression & Recommendations:  Problem # 1:  HEEL PAIN, BILATERAL (ICD-729.5) Assessment New  Appears to mostly be from h/o b/l PF, however much less asymptomatic at this point. - does not require custom orthotics at this point - gave him sports insoles with  2 1/2 inch wide and 3/8 inch thickness heel pads, tried before leaving and felt good.  we gave catalog for him to order more if he desires to in future - reviewed PF stretches and icing - recommended playing tennis on softer surfaces (clay) - f/u 4-6 weeks or really prn  Orders: [place] LIMITED (54098) Sports Insoles (L3510) Heel Pad and Depre  (J1914)  Complete Medication List: 1)  Glucosamine 1500 Complex Caps (Glucosamine-chondroit-vit c-mn) .... Take 1 tablet by mouth two times a day 2)  Aspirin Low Dose 81 Mg Tabs (Aspirin) .Marland Kitchen.. 1 by mouth once daily 3)  Promethazine Hcl 12.5 Mg Tabs (Promethazine hcl) .Marland Kitchen.. 1 by mouth q 6 as needed nausea 4)  Anucort-hc 25 Mg Supp (Hydrocortisone acetate) .Marland Kitchen.. 1 per rectum two times a day x 6 days for hemorrhoidal flares.   Orders Added: 1)  New Patient Level III [99203] 2)  [place] LIMITED [76882] 3)  Sports Insoles [L3510] 4)  Heel Pad and Depre [L3485]

## 2010-08-30 NOTE — Procedures (Signed)
[facility]                                PROCEDURE NOTE   MYCHAL, DECARLO                     MRN:          [id number]  DATE:10/05/2006                            DOB:          [birth year]    PROCEDURE:  Anoscopy.   INDICATION:  Blood in stool.   Risks and benefits explained to patient in detail, he agreed to proceed.  He was placed in the left decubitus position.  Perianal area was  observed, no lesion noted.  Digital rectal examination was nontender, no  masses to reveal.  Anoscope was introduced without difficulty.  Upon  withdrawal careful look at the mucosa was obtained.  A small, under 1-cm  internal hemorrhoid was observed at 6 o'clock, otherwise unremarkable,  no fissures.   IMPRESSION:  Overall normal anoscopic examination with a small,  nonbleeding internal hemorrhoid at 6 o'clock.  No source of bleeding  observed.     Georgina Quint. Plotnikov, MD  Electronically Signed    AVP/MedQ  DD: 10/06/2006  DT: 10/06/2006  Job #: [id number]

## 2010-09-02 NOTE — Assessment & Plan Note (Signed)
[facility]                           PRIMARY CARE OFFICE NOTE   MARTON, MALIZIA                     MRN:          [id number]  DATE:03/27/2006                            DOB:          Sep 02, 1950    The patient is a 60 year old male who presents for wellness examination.   PAST MEDICAL HISTORY/FAMILY HISTORY/SOCIAL HISTORY:  As of March 20, 2005 note.   ALLERGIES:  None.   CURRENT MEDICATIONS:  None.   REVIEW OF SYSTEMS:  No chest pain or shortness of breath.  No syncope.  No neurological complaints.  Occasional problems with prostate, sleep  apnea on CPAP machine.  The rest of the 18-point system review is  negative.   PHYSICAL:  He looks well.  He is in no acute distress.  Blood pressure 126/77, pulse 93, temperature 97.6, weight 191 pounds.  HEENT:  Moist mucosa.  NECK:  Supple.  No thyromegaly or bruit.  LUNGS:  Clear.  No wheeze or rales.  HEART:  S1, S2.  No murmur.  No gallop.  ABDOMEN:  Soft, nontender.  No organomegaly or mass felt.  LOWER EXTREMITIES:  Without edema.  He is alert, oriented, and cooperative.  Denies being depressed.   LABS:  On March 22, 2006, CBC normal, cholesterol 212.  CMET normal.  PSA 0.48, TSH normal.  Urinalysis normal.   ASSESSMENT AND PLAN:  1. Normal wellness examination.  Age/health issues discussed.  2. His EKG today is normal.  3. Start baby aspirin daily.  4. He will try fish oil to improve his LDL value.  5. Sleep apnea on continuous positive airway pressure, to contact Dr.      Shelle Iron for mask readjustment if needed.  6. Prostate problems.  Urology consultation is pending.    Georgina Quint. Plotnikov, MD  Electronically Signed   AVP/MedQ  DD: 04/01/2006  DT: 04/01/2006  Job #: [id number]   cc:   Barbaraann Share, MD,FCCP

## 2010-09-02 NOTE — Assessment & Plan Note (Signed)
[facility]                               PULMONARY OFFICE NOTE   TEREN, ZURCHER                     MRN:          [id number]  DATE:10/25/2005                            DOB:          13-Dec-1950    HISTORY OF PRESENT ILLNESS:  The patient is a 60 year old gentleman who  comes in today for management of an ongoing sleep problem.  The patient  feels that he has difficulty not only going to sleep, but also staying  asleep.  He states that he has been told that he has snoring as well as  pauses in his breathing during sleep.  He describes classic snoring and  gasping arousals during the night at times.  The patient typically gets to  bed between 10:30 and 11:30 and states that it takes about 1-2 hours for him  to fall asleep.  If he takes temazepam, it will take approximately 45  minutes.  He tries to do relaxing behaviors prior to going to bed.  He  certainly has developed severe frustration with trying to go to sleep.  The  patient typically gets up to start his day at 6 a.m. and does not feel  rested, even if he sleeps the whole night.  He does have fairly frequent  awakenings.  The patient states that if he cannot get to sleep, he will  usually toss and turn and stay in bed.  He feels that he sometimes will fall  asleep in micro-bursts.  He will not watch TV in bed.  He does not nap  during the day or in the evenings.  He denies any dozing with TV or movies.  He will play tennis in the evenings between 6:00 and 7:30 and will sometimes  last greater than 1 hour.  He feels anxiousness in his lower extremities,  but denies any frank leg jerks or classic restless leg symptoms.  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  He will  sometimes have sleepiness on the way to work and occasionally during the  day.   PAST MEDICAL HISTORY:  Totally unremarkable.   MEDICATIONS:  The only medication he takes is temazepam 30 mg  nightly p.r.n.   ALLERGIES:  He has no known drug allergies.   SOCIAL HISTORY:  He is divorced and has children.  He has never smoked.   FAMILY HISTORY:  Unremarkable in his first-degree relatives.   REVIEW OF SYSTEMS:  As per history of present illness; also see patient's  intake form documented in the chart.   PHYSICAL EXAMINATION:  GENERAL:  He is a well-developed male in no acute  distress.  VITAL SIGNS:  Blood pressure is 132/82, pulse is 62, temperature is 97.5.  Weight is 174 pounds.  O2 saturation on room air is 99%.  HEENT:  Pupils are equal, round and reactive to light and accommodation.  Extraocular muscles are intact.  Nares are patent without discharge.  Oropharynx shows elongation of soft palate with a normal uvula.  NECK:  Supple without JVD or lymphadenopathy.  There  is no palpable  thyromegaly.  CHEST:  Totally clear.  CARDIAC:  Regular rate and rhythm.  No murmurs, rubs, or gallops.  ABDOMEN:  Soft and nontender with good bowel sounds.  GENITAL, RECTAL AND BREASTS:  Not done and not indicated.  EXTREMITIES:  Lower extremities are without edema.  Pulses are intact  distally.  NEUROLOGIC:  Alert and oriented with no motor deficits.   IMPRESSION:  1.  Psychophysiological insomnia.  The patient certainly has developed a      sense of frustration with trying to initiate sleep.  I have had a long      discussion with him about some of the behavioral changes that he can try      including stimulus control.  This involves not staying in bed if he      cannot fall asleep within 20-30 minutes and going out to his family room      to read or watch television.  He is to return only when he feels like he      can initiate sleep.  The key is to get up at the same time each day,      whether he slept 1 hour or 5 hours.  2.  Inadequate sleep hygiene, which certainly is feeling into his insomnia.      The patient eats very late after he gets back from playing tennis and       also exercises I think too close to bedtime.  I have asked him to try to      eat earlier and also to exercise at least 4 hours prior to bedtime.  The      patient will try to work on this.  3.  Questionable obstructive sleep apnea.  The patient does have snoring and      pauses demonstrated during sleep with snoring and gasping arousals.  He      does feel like he sometimes sleeps in micro-bursts, which can be seen      with obstructive sleep apnea.  I do think he would benefit from a sleep      study.   PLAN:  1.  Stimulus control.  2.  Exercise and eat dinner at an earlier time.  3.  Schedule for nocturnal polysomnography.  The patient will follow up      after the above.                                   Barbaraann Share, MD, FCCP   KMC/MedQ  DD:  11/16/2005  DT:  11/16/2005  Job #:  [id number]   cc:   Sonda Primes, MD

## 2010-09-02 NOTE — Procedures (Signed)
NAME:  Joel Lopez, Joel Lopez              ACCOUNT NO.:  [id number]   MEDICAL RECORD NO.:  [id number]          PATIENT TYPE:  OUT   LOCATION:  SLEEP CENTER                 FACILITY:  [facility]   PHYSICIAN:  Marcelyn Bruins, M.D. [facility] DATE OF BIRTH:  10-13-1950   DATE OF STUDY:  11/06/2005                              NOCTURNAL POLYSOMNOGRAM   INDICATIONS FOR THE STUDY:  Hypersomnia with sleep apnea.   EPWORTH SCORE:  16.   SLEEP ARCHITECTURE:  The patient had total sleep time of 269 minutes with  very little REM, and never achieved slow wave sleep.  Sleep onset latency  was prolonged at 70 minutes, and REM onset was prolonged as well at 165  minutes.  Sleep efficiency was very decreased at 64%.   RESPIRATORY DATA:  The patient was found to have 60 hypopneas and 16 apneas  for a respiratory disturbance index of 17 events per hour.  The events were  not necessarily positional, and there was moderate snoring noted throughout.   OXYGEN DATA:  There was O2 desaturation as low as 86% with the patient's  obstructive events.   CARDIAC DATA:  No clinically significant cardiac arrhythmias.   MOVEMENT/PARASOMNIA:  No clinically significant events were noted.   IMPRESSION/RECOMMENDATIONS:  Mild to moderate obstructive sleep  apnea/hypopnea syndrome with a respiratory disturbance index of 17 events  per hour and O2 desaturation as low as 86%.  Treatment for this degree of  sleep apnea can include upper airway surgery, oral appliance, CPAP, as well  as weight loss alone if applicable.           ______________________________  Marcelyn Bruins, M.D. [facility]  Diplomate, American Board of Sleep  Medicine     KC/MEDQ  D:  11/15/2005 16:52:10  T:  11/16/2005 00:58:35  Job:  [id number]

## 2010-09-02 NOTE — Procedures (Signed)
NAME:  Joel Lopez, Joel Lopez              ACCOUNT NO.:  [id number]   MEDICAL RECORD NO.:  [id number]          PATIENT TYPE:  OUT   LOCATION:  SLEEP CENTER                 FACILITY:  [facility]   PHYSICIAN:  Marcelyn Bruins, M.D. [facility] DATE OF BIRTH:  1950/06/02   DATE OF STUDY:  11/06/2005                              NOCTURNAL POLYSOMNOGRAM   Audio too short to transcribe (less than 5 seconds)           ______________________________  Marcelyn Bruins, M.D. [facility]  Diplomate, American Board of Sleep  Medicine     KC/MEDQ  D:  11/15/2005 16:49:49  T:  11/15/2005 16:51:11  Job:  [id number]

## 2010-12-31 ENCOUNTER — Other Ambulatory Visit: Payer: Self-pay | Admitting: Internal Medicine

## 2010-12-31 DIAGNOSIS — Z0000 Encounter for general adult medical examination without abnormal findings: Secondary | ICD-10-CM

## 2010-12-31 DIAGNOSIS — Z0389 Encounter for observation for other suspected diseases and conditions ruled out: Secondary | ICD-10-CM

## 2011-01-02 ENCOUNTER — Other Ambulatory Visit (INDEPENDENT_AMBULATORY_CARE_PROVIDER_SITE_OTHER): Payer: 59

## 2011-01-02 DIAGNOSIS — Z0000 Encounter for general adult medical examination without abnormal findings: Secondary | ICD-10-CM

## 2011-01-02 DIAGNOSIS — Z0389 Encounter for observation for other suspected diseases and conditions ruled out: Secondary | ICD-10-CM

## 2011-01-02 LAB — URINALYSIS, ROUTINE W REFLEX MICROSCOPIC
Bilirubin Urine: NEGATIVE
Hgb urine dipstick: NEGATIVE
Ketones, ur: NEGATIVE
Leukocytes, UA: NEGATIVE
Specific Gravity, Urine: >=1.03 (ref 1.000–1.030)
Urine Glucose: NEGATIVE
Urobilinogen, UA: 0.2 (ref 0.0–1.0)

## 2011-01-02 LAB — COMPREHENSIVE METABOLIC PANEL
Albumin: 3.7 g/dL (ref 3.5–5.2)
Alkaline Phosphatase: 69 U/L (ref 39–117)
BUN: 23 mg/dL (ref 6–23)
CO2: 25 mEq/L (ref 19–32)
Calcium: 9.4 mg/dL (ref 8.4–10.5)
Chloride: 107 mEq/L (ref 96–112)
GFR: 88.32 mL/min (ref >60.00)
Glucose, Bld: 80 mg/dL (ref 70–99)
Potassium: 4.4 mEq/L (ref 3.5–5.1)
Sodium: 139 mEq/L (ref 135–145)
Total Protein: 6.4 g/dL (ref 6.0–8.3)

## 2011-01-02 LAB — LIPID PANEL
HDL: 52.5 mg/dL (ref >39.00)
LDL Cholesterol: 130 mg/dL — ABNORMAL HIGH (ref 0–99)
Total CHOL/HDL Ratio: 4
Triglycerides: 41 mg/dL (ref 0.0–149.0)
VLDL: 8.2 mg/dL (ref 0.0–40.0)

## 2011-01-02 LAB — CBC WITH DIFFERENTIAL/PLATELET
Basophils Relative: 0.7 % (ref 0.0–3.0)
Eosinophils Relative: 8.6 % — ABNORMAL HIGH (ref 0.0–5.0)
Lymphocytes Relative: 35.7 % (ref 12.0–46.0)
Monocytes Absolute: 0.5 10*3/uL (ref 0.1–1.0)
Monocytes Relative: 12.9 % — ABNORMAL HIGH (ref 3.0–12.0)
Neutrophils Relative %: 42.1 % — ABNORMAL LOW (ref 43.0–77.0)
Platelets: 246 10*3/uL (ref 150.0–400.0)
RBC: 4.48 Mil/uL (ref 4.22–5.81)
WBC: 3.5 10*3/uL — ABNORMAL LOW (ref 4.5–10.5)

## 2011-01-02 LAB — TSH: TSH: 1.17 u[IU]/mL (ref 0.35–5.50)

## 2011-01-06 ENCOUNTER — Encounter: Payer: Self-pay | Admitting: Internal Medicine

## 2011-01-09 ENCOUNTER — Ambulatory Visit (INDEPENDENT_AMBULATORY_CARE_PROVIDER_SITE_OTHER): Payer: 59 | Admitting: Internal Medicine

## 2011-01-09 VITALS — BP 138/80 | HR 62 | Temp 98.3°F | Wt 192.0 lb

## 2011-01-09 DIAGNOSIS — K219 Gastro-esophageal reflux disease without esophagitis: Secondary | ICD-10-CM

## 2011-01-09 DIAGNOSIS — M722 Plantar fascial fibromatosis: Secondary | ICD-10-CM

## 2011-01-09 DIAGNOSIS — M1710 Unilateral primary osteoarthritis, unspecified knee: Secondary | ICD-10-CM

## 2011-01-09 DIAGNOSIS — G4733 Obstructive sleep apnea (adult) (pediatric): Secondary | ICD-10-CM

## 2011-01-09 DIAGNOSIS — K649 Unspecified hemorrhoids: Secondary | ICD-10-CM

## 2011-01-09 DIAGNOSIS — Z136 Encounter for screening for cardiovascular disorders: Secondary | ICD-10-CM

## 2011-01-09 DIAGNOSIS — Z0000 Encounter for general adult medical examination without abnormal findings: Secondary | ICD-10-CM

## 2011-01-09 MED ORDER — HYDROCORTISONE 2.5 % RE CREA: TOPICAL_CREAM | RECTAL | Status: DC

## 2011-01-09 NOTE — Progress Notes (Signed)
Subjective:    Patient ID: Joel Lopez, male    DOB: 1951/03/26, 60 y.o.   MRN: [id number]  HPI [name] presents for annual medical exam. He c/o feeling sluggish, low energy. He has poor sleep duration. Reviewed record: study in 2007 AHI 17. Has seen Dr. Shelle Iron most recently August '11. Has difficulty keeping CPAP on.   Has chronic hemorrhoid problem. Is using no medication. Discussed use of bulk laxatives, sitz baths for flares and cortisone cream.  Past Medical History  Diagnosis Date  . Labyrinthitis   . Personal history of peptic ulcer disease   . Arthritis   . GERD (gastroesophageal reflux disease)   . Anemia of other chronic disease   . Plantar fasciitis   . Hemorrhoids   . Obstructive sleep apnea    No past surgical history on file. Family History  Problem Relation Age of Onset  . Cancer Maternal Aunt   . Cancer Maternal Uncle    History   Social History  . Marital Status: Divorced    Spouse Name: N/A    Number of Children: N/A  . Years of Education: N/A   Occupational History  . Not on file.   Social History Main Topics  . Smoking status: Not on file  . Smokeless tobacco: Not on file  . Alcohol Use:   . Drug Use:   . Sexually Active:    Other Topics Concern  . Not on file   Social History Narrative   Occupation: [employer]Married '72-'87, divorced; '96-00, divorcedLives alonePt has childrenCurrent relationship status off/onNo complaint of sexual dysfunctionNever smokedEtoh <1/weekRegular Exercise- yes, tennis 3 x a week. Resistance training      Review of Systems Review of Systems  Constitutional:  Negative for fever, chills, activity change and unexpected weight change.  HEENT:  Negative for hearing loss, ear pain, congestion, neck stiffness and postnasal drip. Negative for sore throat or swallowing problems. Negative for dental complaints.   Eyes: Negative for vision loss or change in visual acuity.  Respiratory: Negative for chest tightness  and wheezing.   Cardiovascular: Negative for chest pain and palpitation. No decreased exercise tolerance Gastrointestinal: No change in bowel habit. No bloating or gas. No reflux or indigestion Genitourinary: Negative for urgency, frequency, flank pain and difficulty urinating.  Musculoskeletal: Negative for myalgias, back pain, arthralgias and gait problem.  Neurological: Negative for dizziness, tremors, weakness and headaches.  Hematological: Negative for adenopathy.  Psychiatric/Behavioral: Negative for behavioral problems and dysphoric mood.       Objective:   Physical Exam Vital signs reviewed Gen'l: Well nourished well developed, athletic appearing AA male in no acute distress  HEENT: Head: Normocephalic and atraumatic. Right Ear: External ear normal. EAC/TM nl. Left Ear: External ear normal.  EAC/TM nl. Nose: Nose normal. Mouth/Throat: Oropharynx is clear and moist. Dentition - native, in good repair. No buccal or palatal lesions. Posterior pharynx clear. Eyes: Conjunctivae and sclera clear. EOM intact. Pupils are equal, round, and reactive to light. Right eye exhibits no discharge. Left eye exhibits no discharge. Neck: Normal range of motion. Neck supple. No JVD present. No tracheal deviation present. No thyromegaly present.  Cardiovascular: Normal rate, regular rhythm, no gallop, no friction rub, no murmur heard.      Quiet precordium. 2+ radial and DP pulses . No carotid bruits Pulmonary/Chest: Effort normal. No respiratory distress or increased WOB, no wheezes, no rales. No chest wall deformity or CVAT. Abdominal: Soft. Bowel sounds are normal in all quadrants. He exhibits  no distension, no tenderness, no rebound or guarding, No heptosplenomegaly  Genitourinary:  deferred Musculoskeletal: Normal range of motion. He exhibits no edema and no tenderness.       Small and large joints without redness, synovial thickening or deformity. Full range of motion preserved about all small,  median and large joints.  Lymphadenopathy:    He has no cervical or supraclavicular adenopathy.  Neurological: He is alert and oriented to person, place, and time. CN II-XII intact. DTRs 2+ and symmetrical biceps, radial and patellar tendons. Cerebellar function normal with no tremor, rigidity, normal gait and station.  Skin: Skin is warm and dry. No rash noted. No erythema.  Psychiatric: He has a normal mood and affect. His behavior is normal. Thought content normal.  Lab Results  Component Value Date   WBC 3.5* 01/02/2011   HGB 12.9* 01/02/2011   HCT 39.7 01/02/2011   PLT 246.0 01/02/2011   CHOL 191 01/02/2011   TRIG 41.0 01/02/2011   HDL 52.50 01/02/2011   LDLDIRECT 141.4 03/22/2006   ALT 26 01/02/2011   AST 26 01/02/2011   NA 139 01/02/2011   K 4.4 01/02/2011   CL 107 01/02/2011   CREATININE 1.1 01/02/2011   BUN 23 01/02/2011   CO2 25 01/02/2011   TSH 1.17 01/02/2011   PSA 0.71 01/02/2011   Lab Results  Component Value Date   LDLCALC 130* 01/02/2011            Assessment & Plan:

## 2011-01-09 NOTE — Patient Instructions (Signed)
Sleep apnea - sleep hygiene rules; try mirage nasal CPAP mask - ask advanced home care.  Hemorrhoids - for flares or chronic inflammation: sitz bath - warm washcloth can do; bulk laxative, e.g. Metamucil to ensure easy BM, anusol HC 2.5% cream applied twice a day. Nupercainal ointment, otc, as needed for itching and pain. If these steps don't improve problem - referral to GI.   Hemorrhoids Hemorrhoids are dilated (enlarged) veins around the rectum. Sometimes clots will form in the veins. This makes them swollen and painful. These are called thrombosed hemorrhoids. Causes of hemorrhoids include:  Pregnancy: this increases the pressure in the hemorrhoidal veins.   Constipation.   Straining to have a bowel movement.  HOME CARE INSTRUCTIONS  Eat a well balanced diet and drink 6 to 8 glasses of water every day to avoid constipation. You may also use a bulk laxative.   Avoid straining to have bowel movements.   Keep anal area dry and clean.   Only take over-the-counter or prescription medicines for pain, discomfort, or fever as directed by your caregiver.  If thrombosed:  Take hot sitz baths for 20 to 30 minutes, 3 to 4 times per day.   If the hemorrhoids are very tender and swollen, place ice packs on area as tolerated. Using ice packs between sitz baths may be helpful. Fill a plastic bag with ice and use a towel between the bag of ice and your skin.   Special creams and suppositories (Anusol, Nupercainal, Wyanoids) may be used or applied as directed.   Do not use a donut shaped pillow or sit on the toilet for long periods. This increases blood pooling and pain.   Move your bowels when your body has the urge; this will require less straining and will decrease pain and pressure.   Only take over-the-counter or prescription medicines for pain, discomfort, or fever as directed by your caregiver.  SEEK MEDICAL CARE IF:  You have increasing pain and swelling that is not controlled with  your prescription.   You have uncontrolled bleeding.   You have an inability or difficulty having a bowel movement.   You have pain or inflammation outside the area of the hemorrhoids.   You have chills and/or an oral temperature above 100 that lasts for 2 days or longer, or as your caregiver suggests.  MAKE SURE YOU:    Understand these instructions.   Will watch your condition.   Will get help right away if you are not doing well or get worse.  Document Released: 03/31/2000 Document Re-Released: 03/16/2008 [facility] Patient Information 2011 [place], [place].

## 2011-01-11 ENCOUNTER — Encounter: Payer: Self-pay | Admitting: Internal Medicine

## 2011-01-11 DIAGNOSIS — Z0000 Encounter for general adult medical examination without abnormal findings: Secondary | ICD-10-CM | POA: Insufficient documentation

## 2011-01-11 NOTE — Assessment & Plan Note (Signed)
Mild OA. He is not limited in his activities.

## 2011-01-11 NOTE — Assessment & Plan Note (Signed)
Chronic and frequent problem. He reports very frequent bleeding. He does not have significant pain.  Plan - routine care: bulk laxative daily to promote easy, strain free bowel movement           Sitz baths - washcloth method is ok           Anusol HC 2.5% cream to be use daily or twice daily for increased symptoms           Nupercainal ointment (otc) to be used as needed.

## 2011-01-11 NOTE — Assessment & Plan Note (Signed)
He continues to have daytime fatigue. He has not been able to tolerate CPAP using nasal cushions. At his last visit to Dr. Shelle Iron a Mirage nasal mask was discussed.  Plan - patient to contact Dr. Shelle Iron re: ordering a CPAP mask that may be more tolerable.

## 2011-01-11 NOTE — Assessment & Plan Note (Signed)
Interval medical history is stable with no acute events. Physical exam is normal. Lab results are excellent and normal with LDL cholesterol at goal of 130 or less. He is current with colorectal cancer screening with last exam '08. He had a normal and stable PSA over the last three visit. Immunizations: Tdap '10. 12 lead EKG normal w/o signs of ischemia or injury.  In summary - a very nice man who appears to be medically stable. He will continue his healthy life-style. He is asked to return in 1 year or prn.

## 2011-01-11 NOTE — Assessment & Plan Note (Signed)
Voices no complaints and he is not taking any medication at this time.  Plan - for any heartburn or reflux he should first try otc medication such as Zantac or Pepcid.

## 2011-01-11 NOTE — Assessment & Plan Note (Signed)
Chronic problem. He is advised to continue to use orthotic support, to due daily stretching exercises (may go to [employer] for video demonstrations).

## 2011-01-12 ENCOUNTER — Encounter: Payer: Self-pay | Admitting: Internal Medicine

## 2011-02-06 ENCOUNTER — Telehealth: Payer: Self-pay | Admitting: Pulmonary Disease

## 2011-02-06 NOTE — Telephone Encounter (Signed)
Called, spoke with pt.  States he has been using the nasal pillows for the past year but would now like to try a mask - requesting order for this to be sent to [facility].  He was last seen by Dr. Shelle Iron 12/13/09 and has no pending appts.  OV with Dr. Shelle Iron was scheduled for Feb 21, 2011 at 2:45 pm because it has been more than 1 year since last seen by him.  Pt aware of appt date and time and I  did inform pt he can call back to see if [facility] has any cancelations before this time if he would like.  He verbalized understanding of this.

## 2011-02-21 ENCOUNTER — Ambulatory Visit (INDEPENDENT_AMBULATORY_CARE_PROVIDER_SITE_OTHER): Payer: 59 | Admitting: Pulmonary Disease

## 2011-02-21 ENCOUNTER — Encounter: Payer: Self-pay | Admitting: Pulmonary Disease

## 2011-02-21 VITALS — BP 120/70 | HR 62 | Temp 97.9°F | Ht 71.0 in | Wt 196.0 lb

## 2011-02-21 DIAGNOSIS — G4733 Obstructive sleep apnea (adult) (pediatric): Secondary | ICD-10-CM

## 2011-02-21 NOTE — Patient Instructions (Signed)
Will set you machine on auto for more comfort. Will get you a full face mask Please call me in 3-4 weeks to give me update with your progress.

## 2011-02-21 NOTE — Progress Notes (Signed)
  Subjective:    Patient ID: Joel Lopez, male    DOB: Sep 04, 1950, 60 y.o.   MRN: [id number]  HPI The patient comes in today for followup of his obstructive sleep apnea.  He is known to have mild to moderate disease, and has not been seen since the summer of 2011.  He has been using CPAP intermittently since that time, and currently is having issues with nasal pillows.  He would like to try CPAP more consistently, and would like to try a full face mask.  When he doesn't wear CPAP, he has continued snoring, and abnormal breathing pattern during sleep, and nonrestorative sleep with daytime fatigue.   Review of Systems  Constitutional: Negative for fever and unexpected weight change.  HENT: Negative for ear pain, nosebleeds, congestion, sore throat, rhinorrhea, sneezing, trouble swallowing, dental problem, postnasal drip and sinus pressure.   Eyes: Negative for redness and itching.  Respiratory: Negative for cough, chest tightness, shortness of breath and wheezing.   Cardiovascular: Negative for palpitations and leg swelling.  Gastrointestinal: Negative for nausea and vomiting.  Genitourinary: Negative for dysuria.  Musculoskeletal: Negative for joint swelling.  Skin: Negative for rash.  Neurological: Negative for headaches.  Hematological: Does not bruise/bleed easily.  Psychiatric/Behavioral: Negative for dysphoric mood. The patient is not nervous/anxious.        Objective:   Physical Exam Wd male in nad No skin breakdown or pressure necrosis from cpap mask Lower extremities without edema, no cyanosis noted Alert, does not appear overly sleepy, moves all 4 extremities.       Assessment & Plan:

## 2011-02-21 NOTE — Assessment & Plan Note (Addendum)
The patient has known mild-to-moderate obstructive sleep apnea, and has had ongoing issues with CPAP with intermittent use.  He is not happy with the nasal pillows, and would like to go back to a full face mask.  I will go ahead and send the order to his DME for a full face mask, and I would like to try him on the auto setting on his device for now.  I have also discussed with him again dental appliance, which I think would be a good treatment alternative.

## 2011-03-26 ENCOUNTER — Other Ambulatory Visit: Payer: Self-pay | Admitting: Pulmonary Disease

## 2011-03-26 DIAGNOSIS — G4733 Obstructive sleep apnea (adult) (pediatric): Secondary | ICD-10-CM

## 2011-04-18 DIAGNOSIS — M75101 Unspecified rotator cuff tear or rupture of right shoulder, not specified as traumatic: Secondary | ICD-10-CM

## 2011-04-18 HISTORY — DX: Unspecified rotator cuff tear or rupture of right shoulder, not specified as traumatic: M75.101

## 2011-08-17 ENCOUNTER — Ambulatory Visit (INDEPENDENT_AMBULATORY_CARE_PROVIDER_SITE_OTHER): Payer: 59 | Admitting: Sports Medicine

## 2011-08-17 VITALS — BP 147/84 | Ht 71.0 in | Wt 185.0 lb

## 2011-08-17 DIAGNOSIS — S46819A Strain of other muscles, fascia and tendons at shoulder and upper arm level, unspecified arm, initial encounter: Secondary | ICD-10-CM

## 2011-08-17 DIAGNOSIS — S46211S Strain of muscle, fascia and tendon of other parts of biceps, right arm, sequela: Secondary | ICD-10-CM

## 2011-08-17 DIAGNOSIS — S46219A Strain of muscle, fascia and tendon of other parts of biceps, unspecified arm, initial encounter: Secondary | ICD-10-CM | POA: Insufficient documentation

## 2011-08-17 DIAGNOSIS — M75110 Incomplete rotator cuff tear or rupture of unspecified shoulder, not specified as traumatic: Secondary | ICD-10-CM | POA: Insufficient documentation

## 2011-08-17 DIAGNOSIS — S43429A Sprain of unspecified rotator cuff capsule, initial encounter: Secondary | ICD-10-CM

## 2011-08-17 DIAGNOSIS — S43499A Other sprain of unspecified shoulder joint, initial encounter: Secondary | ICD-10-CM

## 2011-08-17 DIAGNOSIS — X500XXA Overexertion from strenuous movement or load, initial encounter: Secondary | ICD-10-CM

## 2011-08-17 DIAGNOSIS — M75111 Incomplete rotator cuff tear or rupture of right shoulder, not specified as traumatic: Secondary | ICD-10-CM | POA: Insufficient documentation

## 2011-08-17 MED ORDER — NITROGLYCERIN 0.2 MG/HR TD PT24: MEDICATED_PATCH | TRANSDERMAL | Status: DC

## 2011-08-17 NOTE — Assessment & Plan Note (Signed)
Begin progressive biceps strength program

## 2011-08-17 NOTE — Patient Instructions (Addendum)
1. Curls at 5 lbs 3 sets of 15 2. Wrist rolls at 5 lbs 3 sets of 15 3. Spokes of the wheels @ 3 lbs 3 sets of 15   - take to first position for 1 week   - if tolerating take to fully extended position 4. Simulation exercises with 3 lbs 3 sets of 15   - forehand   - backhand   - serve motion 5. Internal and external rotation 5lbs 3 sets of 15  After 2 weeks of above exercises, it is OK to hit ground strokes at 50% effort and serves at low effort. Continue for 2 weeks. Return to clinic after 4 weeks for re-evaluation.   Nitroglycerin patch:  - take 1/4 patch and apply to shoulder daily

## 2011-08-17 NOTE — Assessment & Plan Note (Signed)
Still w pain in several positions  We will plan to rehab and gradually progress to tennis strokes  Start NTG protocol  Reck in 1 month

## 2011-08-17 NOTE — Progress Notes (Signed)
  Subjective:    Patient ID: Joel Lopez, male    DOB: May 18, 1950, 61 y.o.   MRN: [id number]  HPI Injury to RT shoulder with forceful Western forehand stroke on March 24 Subsequently seen at [facility] MRI showed RC tear and bicipital injury Started on home theraband rehab  Now at least 50 % better No night pain Still hurts in certain positions  Comes for opinion and to see if he can work toward tennis   Review of Systems     Objective:   Physical Exam  Shoulder: Inspection reveals no abnormalities, atrophy or asymmetry. He does have more RT arm mass and longer RT arm than left  Palpation is normal with no tenderness over AC joint but there is some tenderness over bicipital groove. Popeye mm on RT biceps  ROM is full in all planes. Rotator cuff strength normal throughout. Mild signs of impingement with + Neer but good strength and no real pain on Hawkin's tests, empty can. Speeds and Yergason's tests normal. No labral pathology noted with negative Obrien's, negative clunk and good stability. Normal scapular function observed. No painful arc and no drop arm sign. No apprehension sign  MSK [place]  Rt bicipital tendon shows a rupture of 80% fibers 2 cm below  RC interval and mm is retracted  Supraspinatus shows articular side tear but only about 0.5 cm now This does not gap with motion Mild increase in doppler activity  AC is normal  Subscap, Infraspin, teres minor tendons nl        Assessment & Plan:

## 2011-09-18 ENCOUNTER — Ambulatory Visit: Payer: 59 | Admitting: Sports Medicine

## 2011-09-21 ENCOUNTER — Ambulatory Visit (INDEPENDENT_AMBULATORY_CARE_PROVIDER_SITE_OTHER): Payer: 59 | Admitting: Sports Medicine

## 2011-09-21 VITALS — BP 126/76

## 2011-09-21 DIAGNOSIS — S43499A Other sprain of unspecified shoulder joint, initial encounter: Secondary | ICD-10-CM

## 2011-09-21 DIAGNOSIS — S46219A Strain of muscle, fascia and tendon of other parts of biceps, unspecified arm, initial encounter: Secondary | ICD-10-CM

## 2011-09-21 DIAGNOSIS — M75110 Incomplete rotator cuff tear or rupture of unspecified shoulder, not specified as traumatic: Secondary | ICD-10-CM

## 2011-09-21 NOTE — Patient Instructions (Signed)
Start using 1/2 of a patch instead of a whole one. Use for at least 1 more month.  You can start to slowly increase your activity-- try to only increase a little bit each week to make sure you are doing ok and do not start having pain.  Rely more on your flat serve for now (for the next month; by the end of the month, you can be back to 100% velocity as long as you continue to do well).  You can practice your spin, but do this more slowly. (no more than 80% velocity for the next month)  Follow up in 6 weeks.

## 2011-09-21 NOTE — Assessment & Plan Note (Signed)
Functionally his biceps tendon has done well and he is getting normal strength I advised him that the retraction will not resolve and the partial tear is likely permanent Keep up some of the strength exercises

## 2011-09-21 NOTE — Progress Notes (Signed)
Patient ID: Joel Lopez, male   DOB: 03/11/1951, 61 y.o.   MRN: [id number]  HPI:  F/u bicep rupture and rotator cuff  Some discomfort with stretching right arm, occasionally sharp pain.  No pain at rest. Doesn't feel like he is having any weakness. No night pain.  At 60-70%; feels much better than at last visit.  Doing home exercises every other day  Hitting tennis balls again, not playing at 100%, trying to go at 50% but sometimes above.  No pain, discomfort, or issues with this.   PE: Filed Vitals:   09/21/11 0920  BP: 126/76   Gen: NAD, very pleasant   Shoulder/arm: Inspection reveals no abnormalities, atrophy or asymmetry.  Palpation is normal with no tenderness over AC joint or bicipital groove.  ROM is full in all planes.  Rotator cuff strength normal throughout.  No pain on Hawkin's tests, empty can.  Normal scapular function observed.   Musculoskeletal ultrasound The bicipital tendon rupture is visualized there is still some fluid in and it is slightly smaller than before. The area of retracted biceps muscle is scanned in their some scar tissue developing Rotator cuff tendons today look good and the area of partial tearing in the supraspinatous muscle has resolved

## 2011-09-21 NOTE — Assessment & Plan Note (Signed)
The small right supraspinatous tear seen on MRI in March and visualized on his last visit can no longer be seen on ultrasound His strength seems to be 100%  I would continue the nitroglycerin patches and a half a patch for 6 more weeks Recheck at the end of that time He can increase his hitting power about 10% per week until he sees me so he'll be 100% when he returns

## 2011-10-19 ENCOUNTER — Other Ambulatory Visit: Payer: Self-pay | Admitting: Sports Medicine

## 2011-11-09 ENCOUNTER — Encounter: Payer: Self-pay | Admitting: Sports Medicine

## 2011-11-09 ENCOUNTER — Ambulatory Visit (INDEPENDENT_AMBULATORY_CARE_PROVIDER_SITE_OTHER): Payer: 59 | Admitting: Sports Medicine

## 2011-11-09 VITALS — BP 114/70

## 2011-11-09 DIAGNOSIS — S46219A Strain of muscle, fascia and tendon of other parts of biceps, unspecified arm, initial encounter: Secondary | ICD-10-CM

## 2011-11-09 DIAGNOSIS — S43499A Other sprain of unspecified shoulder joint, initial encounter: Secondary | ICD-10-CM

## 2011-11-09 DIAGNOSIS — M75110 Incomplete rotator cuff tear or rupture of unspecified shoulder, not specified as traumatic: Secondary | ICD-10-CM

## 2011-11-09 NOTE — Assessment & Plan Note (Signed)
He has returned to greater than 90% strength and full functional activity.  He will continue a maintenance program of strengthening but can check with [place] when necessary.

## 2011-11-09 NOTE — Assessment & Plan Note (Signed)
The rotator cuff tear in his supraspinatous has now healed by ultrasound and by examination.

## 2011-11-09 NOTE — Patient Instructions (Signed)
Supraspinatus tear is healed Biceps tear is clinically healed but in shortened position Do curls 3-5x/week to keep RT biceps at 90-95% of strength Light weight 3x/wk tennis strokes

## 2011-11-09 NOTE — Progress Notes (Signed)
Subjective:    Patient ID: Joel Lopez is a 61 y.o. male.  Chief Complaint: HPI Follow-up of bicep tendon rupture that was 70% better on last visit,  Now: 90% better, able to play tennis several times/wk without needing to stop  He is not using any medication He continues doing strength work without pain He has no nighttime pain     Social History   Occupational History  . Not on file.   Social History Main Topics  . Smoking status: Never Smoker   . Smokeless tobacco: Never Used  . Alcohol Use: Yes  . Drug Use: No  . Sexually Active: Yes    ROS       Objective:   Ortho Exam  Retracted biceps on RT Speeds and Yergason's neg Empty can negative Hawkins negative Extended biceps test no real pain External rotation of 90% at abduction causes pain Lift off test strong Abduction strong External rotation strong       Assessment:    Rupture biceps tendon     Plan:     Clinically he has made a better than 90% recovery. He will continue his strength work 3 times a week and then we will recheck as needed.

## 2012-01-10 ENCOUNTER — Telehealth: Payer: Self-pay | Admitting: *Deleted

## 2012-01-10 NOTE — Telephone Encounter (Signed)
Last ov  SEpt '12. Needs an ov

## 2012-01-10 NOTE — Telephone Encounter (Signed)
Patient called reqeust referral to a doctor for Vertigo issues. 01/10/2012

## 2012-01-11 NOTE — Telephone Encounter (Signed)
NOTIFIED PATIENT OF NEED TO SET UP APPOINTMENT WITH DR. Debby Bud CONCERNING VERTIGO PROBLEM. PATIENT STATES WILL CALL BACK TO DO THIS. 096/26/2013  10;09

## 2012-02-29 ENCOUNTER — Telehealth: Payer: Self-pay

## 2012-02-29 ENCOUNTER — Encounter: Payer: Self-pay | Admitting: Internal Medicine

## 2012-02-29 ENCOUNTER — Ambulatory Visit (INDEPENDENT_AMBULATORY_CARE_PROVIDER_SITE_OTHER): Payer: 59 | Admitting: Internal Medicine

## 2012-02-29 VITALS — BP 118/78 | HR 62 | Temp 98.5°F | Resp 12 | Ht 70.5 in | Wt 191.0 lb

## 2012-02-29 DIAGNOSIS — S43499A Other sprain of unspecified shoulder joint, initial encounter: Secondary | ICD-10-CM

## 2012-02-29 DIAGNOSIS — D638 Anemia in other chronic diseases classified elsewhere: Secondary | ICD-10-CM

## 2012-02-29 DIAGNOSIS — G4733 Obstructive sleep apnea (adult) (pediatric): Secondary | ICD-10-CM

## 2012-02-29 DIAGNOSIS — M75110 Incomplete rotator cuff tear or rupture of unspecified shoulder, not specified as traumatic: Secondary | ICD-10-CM

## 2012-02-29 DIAGNOSIS — Z0000 Encounter for general adult medical examination without abnormal findings: Secondary | ICD-10-CM

## 2012-02-29 DIAGNOSIS — S46819A Strain of other muscles, fascia and tendons at shoulder and upper arm level, unspecified arm, initial encounter: Secondary | ICD-10-CM

## 2012-02-29 DIAGNOSIS — S46219A Strain of muscle, fascia and tendon of other parts of biceps, unspecified arm, initial encounter: Secondary | ICD-10-CM

## 2012-02-29 DIAGNOSIS — S43429A Sprain of unspecified rotator cuff capsule, initial encounter: Secondary | ICD-10-CM

## 2012-02-29 NOTE — Patient Instructions (Addendum)
Normal exam except for biceps bulge. Normal labs last several years - no need for repeat.  The key to long healthy life is working at it: good diet, regular exercise. Read Younger Next Year-for men

## 2012-02-29 NOTE — Telephone Encounter (Signed)
Message copied by Anselm Jungling on Thu Feb 29, 2012  2:57 PM ------      Message from: Illene Regulus E      Created: Thu Feb 29, 2012  9:47 AM       Please have someone help pull old record and bring forward colonoscopy reports to [facility] and to health maintenance flow sheet.

## 2012-02-29 NOTE — Progress Notes (Signed)
Subjective:    Patient ID: Joel Lopez, male    DOB: [birth year], 61 y.o.   MRN: [id number]  HPI The patient is here for annual wellness examination and management of other chronic and acute problems.   Had tennis injury with right rotator cuff tear and partial tear short-head of biceps. Initially saw Dr. Thurston Hole who did an MRI for diagnosis and offered surgery. He chose to see Dr. Roanna Epley for more conservative therapy. Reviewed Dr. Darrick Penna notes - at last visit patient was 90% better and had resumed tennis.  The risk factors are reflected in the social history.  The roster of all physicians providing medical care to patient - is listed in the Snapshot section of the chart.  Activities of daily living:  The patient is 100% inedpendent in all ADLs: dressing, toileting, feeding as well as independent mobility  Home safety : The patient has smoke detectors in the home. Falls - no falls. Will do a home survey. They wear seatbelts. firearms are present in the home, kept in a safe fashion. There is no violence in the home.   There is no risks for hepatitis, STDs or HIV. There is no history of blood transfusion. They have no travel history to infectious disease endemic areas of the world.  The patient has seen their dentist in the last six month. They have  seen their eye doctor in the last year. They deny any hearing difficulty and have not had audiologic testing in the last year.    They do not  have excessive sun exposure. Discussed the need for sun protection: hats, long sleeves and use of sunscreen if there is significant sun exposure.   Diet: the importance of a healthy diet is discussed. They do have a healthy diet.  The patient has a regular exercise program: tennis and calesthenics , 90-120 min duration,  3 per week.  The benefits of regular aerobic exercise were discussed.  Depression screen: there are no signs or vegative symptoms of depression- irritability, change in appetite,  anhedonia, sadness/tearfullness.  Cognitive assessment: the patient manages all their financial and personal affairs and is actively engaged.   Past Medical History  Diagnosis Date  . Labyrinthitis   . Personal history of peptic ulcer disease   . Arthritis   . GERD (gastroesophageal reflux disease)   . Plantar fasciitis   . Hemorrhoids   . Obstructive sleep apnea     study '07 - AHI 17, lowest O2 sat 86%. not using CPAP  . Anemia of other chronic disease     normal blood blood counts since 2007   History reviewed. No pertinent past surgical history. Family History  Problem Relation Age of Onset  . Cancer Maternal Aunt   . Cancer Maternal Uncle    History   Social History  . Marital Status: Divorced    Spouse Name: N/A    Number of Children: N/A  . Years of Education: 12   Occupational History  . [employer]    Social History Main Topics  . Smoking status: Never Smoker   . Smokeless tobacco: Never Used  . Alcohol Use: No  . Drug Use: No  . Sexually Active: Yes -- Male partner(s)   Other Topics Concern  . Not on file   Social History Narrative   [employer]. Occupation: [employer]. Married '72-'87, divorced; '96-00, divorced. Lives alone. Pt has children: 2 sons - '72, '80; 1 dtr -'76; 7 grandchildren. Current relationship status  off/on. No complaint of sexual dysfunction. Regular Exercise- yes, tennis 3 x a week. Resistance training      Vision, hearing, body mass index were assessed and reviewed.   During the course of the visit the patient was educated and counseled about appropriate screening and preventive services including : fall prevention , diabetes screening, nutrition counseling, colorectal cancer screening, and recommended immunizations.    Review of Systems Constitutional:  Negative for fever, chills, activity change and unexpected weight change.  HEENT:  Negative for hearing loss, ear pain, congestion, neck stiffness and postnasal drip.  Negative for sore throat or swallowing problems. Negative for dental complaints.   Eyes: Negative for vision loss or change in visual acuity.  Respiratory: Negative for chest tightness and wheezing. Negative for DOE.   Cardiovascular: Negative for chest pain or palpitations. No decreased exercise tolerance Gastrointestinal: No change in bowel habit. No bloating or gas. No reflux or indigestion Genitourinary: Negative for urgency, frequency, flank pain and difficulty urinating.  Musculoskeletal: Negative for myalgias, back pain, arthralgias and gait problem.  Neurological: Negative for dizziness, tremors, weakness and headaches.  Hematological: Negative for adenopathy.  Psychiatric/Behavioral: Negative for behavioral problems and dysphoric mood.       Objective:   Physical Exam Filed Vitals:   02/29/12 0900  BP: 118/78  Pulse: 62  Temp: 98.5 F (36.9 C)  Resp: 12   Wt Readings from Last 3 Encounters:  02/29/12 191 lb (86.637 kg)  08/17/11 185 lb (83.915 kg)  02/21/11 196 lb (88.905 kg)   Gen'l: Well nourished well developed athletic AA man in no acute distress  HEENT: Head: Normocephalic and atraumatic. Right Ear: External ear normal. EAC/TM nl. Left Ear: External ear normal.  EAC/TM nl. Nose: Nose normal. Mouth/Throat: Oropharynx is clear and moist. Dentition - native, in good repair. No buccal or palatal lesions. Posterior pharynx clear. Eyes: Conjunctivae and sclera clear. EOM intact. Pupils are equal, round, and reactive to light. Right eye exhibits no discharge. Left eye exhibits no discharge. Neck: Normal range of motion. Neck supple. No JVD present. No tracheal deviation present. No thyromegaly present.  Cardiovascular: Normal rate, regular rhythm, no gallop, no friction rub, no murmur heard.      Quiet precordium. 2+ radial and DP pulses . No carotid bruits Pulmonary/Chest: Effort normal. No respiratory distress or increased WOB, no wheezes, no rales. No chest wall deformity  or CVAT. Abdomen: Soft. Bowel sounds are normal in all quadrants. He exhibits no distension, no tenderness, no rebound or guarding, No heptosplenomegaly  Genitourinary: deferred to urology  Musculoskeletal: Normal range of motion. He exhibits no edema and no tenderness.       Small and large joints without redness, synovial thickening or deformity. Full range of motion preserved about all small, median and large joints, including right shoulder.  Lymphadenopathy:    He has no cervical or supraclavicular adenopathy.  Neurological: He is alert and oriented to person, place, and time. CN II-XII intact. DTRs 2+ and symmetrical biceps, radial and patellar tendons. Cerebellar function normal with no tremor, rigidity, normal gait and station.  Skin: Skin is warm and dry. No rash noted. No erythema.  Psychiatric: He has a normal mood and affect. His behavior is normal. Thought content normal.   Lab Results  Component Value Date   WBC 3.5* 01/02/2011   HGB 12.9* 01/02/2011   HCT 39.7 01/02/2011   PLT 246.0 01/02/2011   GLUCOSE 80 01/02/2011   CHOL 191 01/02/2011   TRIG 41.0 01/02/2011  HDL 52.50 01/02/2011   LDLDIRECT 141.4 03/22/2006   LDLCALC 130* 01/02/2011   ALT 26 01/02/2011   AST 26 01/02/2011   NA 139 01/02/2011   K 4.4 01/02/2011   CL 107 01/02/2011   CREATININE 1.1 01/02/2011   BUN 23 01/02/2011   CO2 25 01/02/2011   TSH 1.17 01/02/2011   PSA 0.71 01/02/2011         Assessment & Plan:

## 2012-02-29 NOTE — Telephone Encounter (Signed)
Colonoscopy completed 11/28/2006 by [facility] Dr Jarold Motto- Normal, no bleeding, hemorrhoids. Information abstracted and copy to be scanned into EPIC.

## 2012-03-01 NOTE — Assessment & Plan Note (Signed)
Interval medical history notable for right shoulder rotator cuff tear and rupture of biceps tendon with a good non-surgical recovery. Physical exam, sans prostate, normal. Reviewed previous labs: no abnormalities with LDL at goal of 130 or less. Urology has done PSA and record indicates normal studies last 4 years. Discussed pros and cons of prostate cancer screening (USPHCTF recommendations reviewed and ACU April '13 recommendations) and he has had screening this year. He is current with colorectal cancer screening with colonoscopy this past August. Immunizations: current with Tdap. He will be due for Shingles and pneumonia vaccine in May '14.   In summary - a very nice man in good health who is medically stable. He is advised to continue his health life-style, dialing down the aggressive approach to sports a hair, and to have follow-up wellness exam in 18-24 months and labs as appropriate.

## 2012-03-01 NOTE — Assessment & Plan Note (Signed)
Patient reports 905+ recovery of function right shoulder. He is playing tennis.

## 2012-03-01 NOTE — Assessment & Plan Note (Signed)
AHI of 17 at sleep study. This is mild OSA. Patient reports that he sleeps well, does not have any issues with somnolence.   Plan  OK off CPAP  Watch for any change in symptoms: increased snoring, increased daytime somnolence.

## 2012-03-01 NOTE — Assessment & Plan Note (Signed)
Good range of motion right shoulder with return to normal activity

## 2012-03-01 NOTE — Assessment & Plan Note (Signed)
Chart reviewed back to '07: Hgb always normal. No evidence in chart of anemia

## 2012-04-17 DIAGNOSIS — S46119A Strain of muscle, fascia and tendon of long head of biceps, unspecified arm, initial encounter: Secondary | ICD-10-CM

## 2012-04-17 HISTORY — DX: Strain of muscle, fascia and tendon of long head of biceps, unspecified arm, initial encounter: S46.119A

## 2012-05-14 ENCOUNTER — Ambulatory Visit (INDEPENDENT_AMBULATORY_CARE_PROVIDER_SITE_OTHER): Payer: 59 | Admitting: Sports Medicine

## 2012-05-14 VITALS — BP 154/78 | Ht 71.0 in | Wt 186.0 lb

## 2012-05-14 DIAGNOSIS — S46219A Strain of muscle, fascia and tendon of other parts of biceps, unspecified arm, initial encounter: Secondary | ICD-10-CM

## 2012-05-14 DIAGNOSIS — S43499A Other sprain of unspecified shoulder joint, initial encounter: Secondary | ICD-10-CM

## 2012-05-14 DIAGNOSIS — S43429A Sprain of unspecified rotator cuff capsule, initial encounter: Secondary | ICD-10-CM

## 2012-05-14 DIAGNOSIS — M75110 Incomplete rotator cuff tear or rupture of unspecified shoulder, not specified as traumatic: Secondary | ICD-10-CM

## 2012-05-14 MED ORDER — NITROGLYCERIN 0.2 MG/HR TD PT24: MEDICATED_PATCH | TRANSDERMAL | Status: DC

## 2012-05-14 NOTE — Progress Notes (Signed)
Patient ID: Joel Lopez, male   DOB: [birth year], 62 y.o.   MRN: [id number]  Patient last seen by me in July Had partial biceps tendon rupture Had been doing fine playing competitive tennis until Jan 6 Before injury was hitting all strokes 100%  Pain did not start on court but after he got home Felt pain in RT shoulder around entire head of humerus No night pain Took advil with no real relief for 10 days Did exercises but that hurt at first and better after a few days  Now 50% improved over Jan 6 He thinks he injured the tendon on right forehand stroke  Physical examination  Shoulder: Inspection reveals retraction of the right biceps muscle with some asymmetry. Palpation is normal with no tenderness over AC joint or bicipital groove. ROM is full in all planes. Rotator cuff strength normal throughout. No signs of impingement with negative Neer and Hawkin's tests, empty can. However he does get some mild pain on the superior and posterior shoulder with resistance Speeds and Yergason's tests normal.- Some mild pain with resistance   No labral pathology noted with negative Obrien's, negative clunk and good stability. Normal scapular function observed. No painful arc and no drop arm sign. No apprehension sign  Musculoskeletal ultrasound  Of the right biceps tendon there is a further retraction of the muscle. There is a hypoechoic area that may represent a fairly recent tear of the scar tissue where the tendon had reattached  Rotator cuff tendons are normal but there is some slight atrophy of the supraspinatous A.c. joint is unremarkable

## 2012-05-14 NOTE — Assessment & Plan Note (Signed)
Keep her on the biceps tendon partially loose from its reattachment  We will start him with curls and forearm rolls and steadily rehabilitate this tendon  Weight 2 weeks i- then easy tennis and then gradually progress  Recheck 6 weeks

## 2012-05-14 NOTE — Assessment & Plan Note (Signed)
I do not see a need care but he still has some atrophy in the supraspinatous  Restart his series of rotator cuff strengthening so that he keeps this area strong

## 2012-05-30 ENCOUNTER — Ambulatory Visit: Payer: 59 | Admitting: Sports Medicine

## 2012-06-25 ENCOUNTER — Ambulatory Visit (INDEPENDENT_AMBULATORY_CARE_PROVIDER_SITE_OTHER): Payer: 59 | Admitting: Sports Medicine

## 2012-06-25 ENCOUNTER — Encounter: Payer: Self-pay | Admitting: Sports Medicine

## 2012-06-25 VITALS — BP 148/84 | HR 62 | Ht 71.0 in | Wt 186.0 lb

## 2012-06-25 DIAGNOSIS — S46211D Strain of muscle, fascia and tendon of other parts of biceps, right arm, subsequent encounter: Secondary | ICD-10-CM

## 2012-06-25 DIAGNOSIS — Z5189 Encounter for other specified aftercare: Secondary | ICD-10-CM

## 2012-06-25 NOTE — Patient Instructions (Addendum)
Continue current home exercise program. - Perform Hammer curls and forearm rolls with 10-15 lb dumbbells everyday. - Then, add tennis stroke motions with 5 lb dumb bells. - Wear the compression sleeve during warm up before tennis and for 15-30 minutes while playing tennis. - Remember to stretch before and after playing. - While playing tennis, rotate your hips more and bend your knees to avoid overextension of RT arm. - Return to clinic in 3 months or soon as needed.

## 2012-06-25 NOTE — Assessment & Plan Note (Signed)
RT biceps muscle retraction with continued stretching of scar tissue while playing tennis.  Symptoms are 60% better.  Patient can continue HEP with the addition of tennis strokes with 5 lb weights and working on his form (avoiding hyperextension of RT arm) while playing tennis.  He was given a Helix compression sleeve to wear during warm up and first 15-30 minutes of game.  Follow up appointment in 3 months or sooner as needed.

## 2012-06-25 NOTE — Progress Notes (Signed)
  Subjective:    Patient ID: Joel Lopez, male    DOB: 01-27-1951, 62 y.o.   MRN: [id number]  HPI  62 year old male here for follow RT shoulder pain. Last seen on 05/14/12 - found to have a tear of scar tissue from old biceps tendon rupture.   Patient says he fells 60% better. Still complains of pain when he abducts and overly ER RT arm during tennis. Pain located biceps muscle and radiates to shoulder. He has taken Aleve which resolves pain. He was using NTG patch daily x 6-7 weeks.  Complains of HA initially, but this typically resolves on its own. He has been playing easy tennis; complains of pain when he overextends with forehand and backhand. He also performs forearm rolls and hammer curls every other day. Denies any numbness/tingling in RT UE. Pain does not wake him at up night.  Review of Systems Per HPI    Objective:   Physical Exam RT Shoulder: Inspection reveals retraction of the right biceps muscle with asymmetry. No tenderness on palpation over AC join. ROM is full in all planes. Rotator cuff strength normal throughout. No signs of impingement with negative empty can and Hawkin's tests. Speeds and Yergason's tests normal. Normal scapular function observed.     Assessment & Plan:

## 2012-09-24 ENCOUNTER — Ambulatory Visit (INDEPENDENT_AMBULATORY_CARE_PROVIDER_SITE_OTHER): Payer: 59 | Admitting: Sports Medicine

## 2012-09-24 VITALS — BP 132/77 | Ht 71.0 in | Wt 186.0 lb

## 2012-09-24 DIAGNOSIS — S46211D Strain of muscle, fascia and tendon of other parts of biceps, right arm, subsequent encounter: Secondary | ICD-10-CM

## 2012-09-24 DIAGNOSIS — Z5189 Encounter for other specified aftercare: Secondary | ICD-10-CM

## 2012-09-24 NOTE — Assessment & Plan Note (Signed)
Current symptoms relate to stretching the scar tissue around his biceps muscle insertion  We will use progressive exercises that stress the biceps with tennis motions as well as curls with shoulder flexion of 90 at 3 positions

## 2012-09-24 NOTE — Progress Notes (Signed)
  Subjective:    Patient ID: Joel Lopez, male    DOB: 1950/11/09, 62 y.o.   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  Patient states that he is 80% better. Pain is located around the biceps. He still has pain with external rotation when playing tennis. Laying on his arm can also exacerbate it. He did bench presses a few days ago going from 45lbs to 135lbs of weight, which exacerbated the pain in his right shoulder the next day.  He has stopped taking the nitro patches. Does HEP. Wears compression sleeve.   Review of Systems Negative except per HPI    Objective:   Physical Exam Filed Vitals:   09/24/12 0931  BP: 132/77   General: no acute distress, pleasant gentleman Right Shoulder:  Right biceps retracted compared to left with tendon scar tissue palpable about 10cm distal from biceps groove No tenderness along AC joint Normal range of motion, normal Apley's scratch test, normal abduction and internal rotation. Normal external rotation bilaterally, normal internal rotation, Negative speed's, negative Yergason's Negative Hawkins, negative empty can Negative O'Brien's Pain with forced 120 degree forced external rotation          Assessment & Plan:  62 yo male with right shoulder pain. There used to be a rotator cuff component with supraspinatous weakness. There doesn't appear to be rotator cuff pathology during this visit. He does have residual scarring from previous biceps tendon rupture which is the likely reason for his pain.  - reviewed new exercises for HEP  - continue compression sleeve - follow up as needed

## 2012-09-24 NOTE — Patient Instructions (Addendum)
Back hand Slice motion with 5lbs, 3 sets of 15. No more than 10 lbs  Open stance forehand position: where you feel it tightening and closing in with 10lbs.   Upright curls: in front of you then 90 degrees and then back.

## 2013-04-09 ENCOUNTER — Encounter: Payer: Self-pay | Admitting: Internal Medicine

## 2013-04-09 ENCOUNTER — Ambulatory Visit (INDEPENDENT_AMBULATORY_CARE_PROVIDER_SITE_OTHER): Payer: 59 | Admitting: Internal Medicine

## 2013-04-09 ENCOUNTER — Encounter: Payer: 59 | Admitting: Internal Medicine

## 2013-04-09 ENCOUNTER — Other Ambulatory Visit (INDEPENDENT_AMBULATORY_CARE_PROVIDER_SITE_OTHER): Payer: 59

## 2013-04-09 VITALS — BP 138/86 | HR 65 | Temp 98.1°F | Ht 71.0 in | Wt 185.8 lb

## 2013-04-09 DIAGNOSIS — Z5189 Encounter for other specified aftercare: Secondary | ICD-10-CM

## 2013-04-09 DIAGNOSIS — M75111 Incomplete rotator cuff tear or rupture of right shoulder, not specified as traumatic: Secondary | ICD-10-CM

## 2013-04-09 DIAGNOSIS — Z0000 Encounter for general adult medical examination without abnormal findings: Secondary | ICD-10-CM

## 2013-04-09 DIAGNOSIS — S46211D Strain of muscle, fascia and tendon of other parts of biceps, right arm, subsequent encounter: Secondary | ICD-10-CM

## 2013-04-09 DIAGNOSIS — S43429A Sprain of unspecified rotator cuff capsule, initial encounter: Secondary | ICD-10-CM

## 2013-04-09 NOTE — Progress Notes (Signed)
Subjective:    Patient ID: Joel Lopez, male    DOB: [birth year], 62 y.o.   MRN: [id number]  HPI Joel Lopez presents for a general wellness exam. He has been feeling good. Interval - no major illness, no surgery. Did tear his rotator cuff -right and avulsed the long-head of the right biceps. He does have mild functional limitations - 90% recovered. Current with dentist, current with ophthalmologist. Remains physically active - playing tennis at least 3 times a week both singles and mostly doubles.   Past Medical History  Diagnosis Date  . Labyrinthitis   . Personal history of peptic ulcer disease   . Arthritis   . GERD (gastroesophageal reflux disease)   . Plantar fasciitis   . Hemorrhoids   . Obstructive sleep apnea     study '07 - AHI 17, lowest O2 sat 86%. not using CPAP  . Anemia of other chronic disease     normal blood blood counts since 2007   History reviewed. No pertinent past surgical history. Family History  Problem Relation Age of Onset  . Cancer Maternal Aunt   . Cancer Maternal Uncle    History   Social History  . Marital Status: Divorced    Spouse Name: N/A    Number of Children: N/A  . Years of Education: 12   Occupational History  . METER TECH    Social History Main Topics  . Smoking status: Never Smoker   . Smokeless tobacco: Never Used  . Alcohol Use: No  . Drug Use: No  . Sexual Activity: Yes    Partners: Female   Other Topics Concern  . Not on file   Social History Narrative   [employer]. Occupation: [employer]. Married '72-'87, divorced; '96-00, divorced. Lives alone. Pt has children: 2 sons - '72, '80; 1 dtr -'76; 7 grandchildren. Current relationship status off/on. No complaint of sexual dysfunction. Regular Exercise- yes, tennis 3 x a week. Resistance training         Review of Systems Constitutional:  Negative for fever, chills, activity change and unexpected weight change.  HEENT:  Negative for hearing loss, ear  pain, congestion, neck stiffness and postnasal drip. Negative for sore throat or swallowing problems. Negative for dental complaints.   Eyes: Negative for vision loss or change in visual acuity.  Respiratory: Negative for chest tightness and wheezing. Negative for DOE.   Cardiovascular: Negative for chest pain or palpitations. No decreased exercise tolerance Gastrointestinal: No change in bowel habit. No bloating or gas. No reflux or indigestion Genitourinary: Negative for urgency, frequency, flank pain and difficulty urinating.  Musculoskeletal: Negative for myalgias, back pain, arthralgias and gait problem.  Neurological: Negative for dizziness, tremors, weakness and headaches.  Hematological: Negative for adenopathy.  Psychiatric/Behavioral: Negative for behavioral problems and dysphoric mood.     Objective:   Physical Exam Filed Vitals:   04/09/13 1209  BP: 138/86  Pulse: 65  Temp: 98.1 F (36.7 C)   Wt Readings from Last 3 Encounters:  04/09/13 185 lb 12.8 oz (84.278 kg)  09/24/12 186 lb (84.369 kg)  06/25/12 186 lb (84.369 kg)   Gen'l: Well nourished well developed     male in no acute distress  HEENT: Head: Normocephalic and atraumatic. Right Ear: External ear normal. EAC/TM nl. Left Ear: External ear normal.  EAC/TM nl. Nose: Nose normal. Mouth/Throat: Oropharynx is clear and moist. Dentition - native, in good repair. No buccal or palatal lesions. Posterior pharynx clear. Eyes: Conjunctivae  and sclera clear. EOM intact. Pupils are equal, round, and reactive to light. Right eye exhibits no discharge. Left eye exhibits no discharge. Neck: Normal range of motion. Neck supple. No JVD present. No tracheal deviation present. No thyromegaly present.  Cardiovascular: Normal rate, regular rhythm, no gallop, no friction rub, no murmur heard.      Quiet precordium. 2+ radial and DP pulses . No carotid bruits Pulmonary/Chest: Effort normal. No respiratory distress or increased WOB, no  wheezes, no rales. No chest wall deformity or CVAT. Abdomen: Soft. Bowel sounds are normal in all quadrants. He exhibits no distension, no tenderness, no rebound or guarding, No heptosplenomegaly  Genitourinary:   Musculoskeletal: Normal range of motion. He exhibits no edema and no tenderness.       Small and large joints without redness, synovial thickening or deformity. Full range of motion preserved about all small, median and large joints.  Lymphadenopathy:    He has no cervical or supraclavicular adenopathy.  Neurological: He is alert and oriented to person, place, and time. CN II-XII intact. DTRs 2+ and symmetrical biceps, radial and patellar tendons. Cerebellar function normal with no tremor, rigidity, normal gait and station.  Skin: Skin is warm and dry. No rash noted. No erythema.  Psychiatric: He has a normal mood and affect. His behavior is normal. Thought content normal.   Recent Results (from the past 2160 hour(s))  LIPID PANEL     Status: Abnormal   Collection Time    04/09/13  1:25 PM      Result Value Range   Cholesterol 169  0 - 200 mg/dL   Comment: ATP III Classification       Desirable:  < 200 mg/dL               Borderline High:  200 - 239 mg/dL          High:  > = 161 mg/dL   Triglycerides 09.6  0.0 - 149.0 mg/dL   Comment: Normal:  <045 mg/dLBorderline High:  150 - 199 mg/dL   HDL 40.98  >11.91 mg/dL   VLDL 47.8  0.0 - 29.5 mg/dL   LDL Cholesterol 621 (*) 0 - 99 mg/dL   Total CHOL/HDL Ratio 3     Comment:                Men          Women1/2 Average Risk     3.4          3.3Average Risk          5.0          4.42X Average Risk          9.6          7.13X Average Risk          15.0          11.0                      BASIC METABOLIC PANEL     Status: Abnormal   Collection Time    04/09/13  1:25 PM      Result Value Range   Sodium 140  135 - 145 mEq/L   Potassium 5.3 (*) 3.5 - 5.1 mEq/L   Chloride 105  96 - 112 mEq/L   CO2 29  19 - 32 mEq/L   Glucose, Bld 86  70 -  99 mg/dL   BUN 19  6 - 23  mg/dL   Creatinine, Ser 1.0  0.4 - 1.5 mg/dL   Calcium 9.7  8.4 - 40.9 mg/dL   GFR 81.19  >14.78 mL/min         Assessment & Plan:

## 2013-04-09 NOTE — Patient Instructions (Signed)
Happy Holidays  You are in good shape with a normal exam.  Labs today - results will be posted to MyChart  You are current with colorectal and prostate cancer screening. Immunization - current with tetanus, check with insuror about shingles vaccine.  Come back in a year.

## 2013-04-09 NOTE — Progress Notes (Signed)
Pre visit review using our clinic review tool, if applicable. No additional management support is needed unless otherwise documented below in the visit note. 

## 2013-04-11 LAB — BASIC METABOLIC PANEL
Calcium: 9.7 mg/dL (ref 8.4–10.5)
GFR: 97.83 mL/min (ref >60.00)
Glucose, Bld: 86 mg/dL (ref 70–99)
Sodium: 140 mEq/L (ref 135–145)

## 2013-04-11 LAB — LIPID PANEL
HDL: 53.3 mg/dL (ref >39.00)
Total CHOL/HDL Ratio: 3
VLDL: 11.2 mg/dL (ref 0.0–40.0)

## 2013-04-12 NOTE — Assessment & Plan Note (Signed)
Interval histor w/o major illness, injuries are stable and he has made good recovery. Physical exam is normal. Labs reviewed - in normal range, including lipid profile . He is current with colorectal cancer screening. Discussed pros and cons of prostate cancer screening (USPHCTF recommendations reviewed and ACU April '13 recommendations) and he defers evaluation at this time w/ normal PSA '08,'10, '11, '12. Tdap is current  In summary  A nice man who is medically stable at today's visit.

## 2013-04-12 NOTE — Assessment & Plan Note (Signed)
Doing well: self-assessment is that he has 90% normal function. His tennis game is a little affected - does better playing doubles.

## 2013-04-12 NOTE — Assessment & Plan Note (Signed)
By Mr. Summer report he has 90% function of the right arm. His tennis game is 90% of par. No further treatment contemplated at this time.

## 2013-04-14 ENCOUNTER — Encounter: Payer: Self-pay | Admitting: Internal Medicine

## 2013-05-12 ENCOUNTER — Ambulatory Visit (INDEPENDENT_AMBULATORY_CARE_PROVIDER_SITE_OTHER): Payer: 59 | Admitting: Internal Medicine

## 2013-05-12 ENCOUNTER — Encounter: Payer: Self-pay | Admitting: Internal Medicine

## 2013-05-12 VITALS — BP 126/90 | HR 61 | Temp 97.7°F | Wt 187.8 lb

## 2013-05-12 DIAGNOSIS — R109 Unspecified abdominal pain: Secondary | ICD-10-CM

## 2013-05-12 DIAGNOSIS — B372 Candidiasis of skin and nail: Secondary | ICD-10-CM

## 2013-05-12 MED ORDER — CLOTRIMAZOLE-BETAMETHASONE 1-0.05 % EX CREA: 1.0000 "application " | TOPICAL_CREAM | Freq: Two times a day (BID) | CUTANEOUS | Status: DC

## 2013-05-12 MED ORDER — MUPIROCIN CALCIUM 2 % NA OINT: 1.0000 "application " | TOPICAL_OINTMENT | Freq: Two times a day (BID) | NASAL | Status: DC

## 2013-05-12 NOTE — Progress Notes (Signed)
Pre visit review using our clinic review tool, if applicable. No additional management support is needed unless otherwise documented below in the visit note. 

## 2013-05-12 NOTE — Patient Instructions (Signed)
Naval irritation - looks like a minor yeast like infection but cannot rule out mild fungal infection. Plan lotrisone cream apply to a dry naval twice a day  Sore at the opening of the nose - very likely a staph infection Plan  Mupirocin nasal ointment - apply twice a day until healed  Sore Abds - no pain, no gain.

## 2013-05-13 NOTE — Progress Notes (Signed)
Subjective:    Patient ID: Joel Lopez, male    DOB: 1950/08/12, 63 y.o.   MRN: [id number]  HPI Mr. Blanck presents for irritation of the umbilicus - red, pruritic at times. He also reports an internal type pain that is aching but not sharp. This latter pain is worse with exertion, such as playing tennis. He does admit to have change his exercise regimen lately to include "ab" strengthening.  He c/o recurrent sore just at the entrance to the nostril, left and a sore just inside the nostril.  Past Medical History  Diagnosis Date  . Labyrinthitis   . Personal history of peptic ulcer disease   . Arthritis   . GERD (gastroesophageal reflux disease)   . Plantar fasciitis   . Hemorrhoids   . Obstructive sleep apnea     study '07 - AHI 17, lowest O2 sat 86%. not using CPAP  . Anemia of other chronic disease     normal blood blood counts since 2007  . Right rotator cuff tear 2013    90% normal function.  . Rupture long head biceps tendon 2014    right. 90% normal function   Past Surgical History  Procedure Laterality Date  . Orif ulna,right Left 1985    had screws and plate; had screws and plate removed 8299   Family History  Problem Relation Age of Onset  . Cancer Maternal Aunt     colon  . Cancer Maternal Uncle     esophageal  . Cancer Mother     leukemia  . Diabetes Neg Hx   . Heart disease Neg Hx   . Hyperlipidemia Neg Hx   . Stroke Neg Hx    History   Social History  . Marital Status: Divorced    Spouse Name: N/A    Number of Children: 3  . Years of Education: 12   Occupational History  . METER TECH    Social History Main Topics  . Smoking status: Never Smoker   . Smokeless tobacco: Never Used  . Alcohol Use: No  . Drug Use: No  . Sexual Activity: Yes    Partners: Female   Other Topics Concern  . Not on file   Social History Narrative   [employer]. 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 Lives  alone. Pt has children: 2 sons - '72, '80; 1 dtr -'76; 7 grandchildren. Current relationship status off/on. No complaint of sexual dysfunction. Regular Exercise- yes, tennis 3 x a week. Resistance training. Discussed ACP and referred to [email or web address] for his consideration (Dec '14)     No current outpatient prescriptions on file prior to visit.   No current facility-administered medications on file prior to visit.      Review of Systems System review is negative for any constitutional, cardiac, pulmonary, GI or neuro symptoms or complaints other than as described in the HPI.     Objective:   Physical Exam Filed Vitals:   05/12/13 0925  BP: 126/90  Pulse: 61  Temp: 97.7 F (36.5 C)   Wt Readings from Last 3 Encounters:  05/12/13 187 lb 12.8 oz (85.186 kg)  04/09/13 185 lb 12.8 oz (84.278 kg)  09/24/12 186 lb (84.369 kg)   Gen'l - an athletic appearing man in no distress Cor - RRR Pulm - normal respirations Neuro - A&O x 3 Derm - umbilicus with erythema w/o specific lesions; small pinpoint lesion at left nares and irritation just inside  the nostril; tiny linear "crack" in the skin at the corner of the mouth.       Assessment & Plan:  1. Derm - probable yeast infection umbilicus although there may be a fungal aspect. Plan lotrisone cream to umbilicus BID until clear           Small lesion at nares - concern for a small staph lesion, also in the nose Plan Mupirocin nasal ointment to lesion and inside of nostril BID        Linear lesion at left corner of mouth - uncertain cause Plan Mupirocin to this lesion.  2. Abdominal pain - 2/2 muscle strain related to new exercise regimen.

## 2013-07-07 ENCOUNTER — Encounter: Payer: Self-pay | Admitting: Internal Medicine

## 2013-07-07 ENCOUNTER — Ambulatory Visit (INDEPENDENT_AMBULATORY_CARE_PROVIDER_SITE_OTHER): Payer: 59 | Admitting: Internal Medicine

## 2013-07-07 VITALS — BP 132/92 | HR 59 | Temp 97.8°F | Wt 189.0 lb

## 2013-07-07 DIAGNOSIS — K469 Unspecified abdominal hernia without obstruction or gangrene: Secondary | ICD-10-CM

## 2013-07-07 NOTE — Progress Notes (Signed)
Subjective:    Patient ID: Joel Lopez, male    DOB: [birth year], 63 y.o.   MRN: [id number]  HPI Joel Lopez presents with a swelling at the left groin. Not sore, soft, reducible.   Past Medical History  Diagnosis Date  . Labyrinthitis   . Personal history of peptic ulcer disease   . Arthritis   . GERD (gastroesophageal reflux disease)   . Plantar fasciitis   . Hemorrhoids   . Obstructive sleep apnea     study '07 - AHI 17, lowest O2 sat 86%. not using CPAP  . Anemia of other chronic disease     normal blood blood counts since 2007  . Right rotator cuff tear 2013    90% normal function.  . Rupture long head biceps tendon 2014    right. 90% normal function   Past Surgical History  Procedure Laterality Date  . Orif ulna,right Left 1985    had screws and plate; had screws and plate removed 2703   Family History  Problem Relation Age of Onset  . Cancer Maternal Aunt     colon  . Cancer Maternal Uncle     esophageal  . Cancer Mother     leukemia  . Diabetes Neg Hx   . Heart disease Neg Hx   . Hyperlipidemia Neg Hx   . Stroke Neg Hx    History   Social History  . Marital Status: Divorced    Spouse Name: N/A    Number of Children: 3  . Years of Education: 12   Occupational History  . METER TECH    Social History Main Topics  . Smoking status: Never Smoker   . Smokeless tobacco: Never Used  . Alcohol Use: No  . Drug Use: No  . Sexual Activity: Yes    Partners: Female   Other Topics Concern  . Not on file   Social History Narrative   [employer]. 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 Lives alone. Pt has children: 2 sons - '72, '80; 1 dtr -'76; 7 grandchildren. Current relationship status off/on. No complaint of sexual dysfunction. Regular Exercise- yes, tennis 3 x a week. Resistance training. Discussed ACP and referred to [email or web address] for Joel Lopez consideration (Dec '14)      Current Outpatient  Prescriptions on File Prior to Visit  Medication Sig Dispense Refill  . clotrimazole-betamethasone (LOTRISONE) cream Apply 1 application topically 2 (two) times daily.  30 g  0  . mupirocin nasal ointment (BACTROBAN) 2 % Place 1 application into the nose 2 (two) times daily. Use a small amt on q-tip in affected  nostril twice daily for five (5) days. After application, press sides of nose together and gently massage.  10 g  0   No current facility-administered medications on file prior to visit.      Review of Systems System review is negative for any constitutional, cardiac, pulmonary, GI or neuro symptoms or complaints other than as described in the HPI.     Objective:   Physical Exam Filed Vitals:   07/07/13 1100  BP: 132/92  Pulse: 59  Temp: 97.8 F (36.6 C)   Abdomen -  Very small left lower abdominal bulge, not tender, easily reducible. + BS x 4, no guarding or rebound Cor - 2+ radial pulse, RRR Pulm Normal respirations Neuro - A&O x 3.         Assessment & Plan:  Hernia left groin - Small bulge  in the left lower abdomen most consistent with a small hernia. Easily reducible and non-tender: no need for surgical consultation.  Plan Advised to the signs of progressive hernia that will need attention: enlargement, irreducible and tender.

## 2013-07-07 NOTE — Assessment & Plan Note (Signed)
Small bulge in the left lower abdomen most consistent with a small hernia. Easily reducible and non-tender: no need for surgical consultation.  Plan Advised to the signs of progressive hernia that will need attention: enlargement, irreducible and tender.

## 2013-07-07 NOTE — Progress Notes (Signed)
Pre visit review using our clinic review tool, if applicable. No additional management support is needed unless otherwise documented below in the visit note. 

## 2013-07-07 NOTE — Patient Instructions (Signed)
Small bulge in the left lower abdomen most consistent with a small hernia. Easily reducible and non-tender: no need for surgical consultation.  Plan Advised to the signs of progressive hernia that will need attention: enlargement, irreducible and tender.   Hernia A hernia occurs when an internal organ pushes out through a weak spot in the abdominal wall. Hernias most commonly occur in the groin and around the navel. Hernias often can be pushed back into place (reduced). Most hernias tend to get worse over time. Some abdominal hernias can get stuck in the opening (irreducible or incarcerated hernia) and cannot be reduced. An irreducible abdominal hernia which is tightly squeezed into the opening is at risk for impaired blood supply (strangulated hernia). A strangulated hernia is a medical emergency. Because of the risk for an irreducible or strangulated hernia, surgery may be recommended to repair a hernia. CAUSES   Heavy lifting.  Prolonged coughing.  Straining to have a bowel movement.  A cut (incision) made during an abdominal surgery. HOME CARE INSTRUCTIONS   Bed rest is not required. You may continue your normal activities.  Avoid lifting more than 10 pounds (4.5 kg) or straining.  Cough gently. If you are a smoker it is best to stop. Even the best hernia repair can break down with the continual strain of coughing. Even if you do not have your hernia repaired, a cough will continue to aggravate the problem.  Do not wear anything tight over your hernia. Do not try to keep it in with an outside bandage or truss. These can damage abdominal contents if they are trapped within the hernia sac.  Eat a normal diet.  Avoid constipation. Straining over long periods of time will increase hernia size and encourage breakdown of repairs. If you cannot do this with diet alone, stool softeners may be used. SEEK IMMEDIATE MEDICAL CARE IF:   You have a fever.  You develop increasing abdominal  pain.  You feel nauseous or vomit.  Your hernia is stuck outside the abdomen, looks discolored, feels hard, or is tender.  You have any changes in your bowel habits or in the hernia that are unusual for you.  You have increased pain or swelling around the hernia.  You cannot push the hernia back in place by applying gentle pressure while lying down. MAKE SURE YOU:   Understand these instructions.  Will watch your condition.  Will get help right away if you are not doing well or get worse. Document Released: 04/03/2005 Document Revised: 06/26/2011 Document Reviewed: 11/21/2007 [facility] Patient Information 2014 [place].

## 2014-09-29 ENCOUNTER — Encounter: Payer: Self-pay | Admitting: Family

## 2014-09-29 ENCOUNTER — Ambulatory Visit (INDEPENDENT_AMBULATORY_CARE_PROVIDER_SITE_OTHER): Payer: 59 | Admitting: Family

## 2014-09-29 VITALS — BP 124/84 | HR 66 | Temp 97.8°F | Resp 18 | Ht 71.0 in | Wt 188.1 lb

## 2014-09-29 DIAGNOSIS — L989 Disorder of the skin and subcutaneous tissue, unspecified: Secondary | ICD-10-CM | POA: Diagnosis not present

## 2014-09-29 NOTE — Progress Notes (Signed)
Pre visit review using our clinic review tool, if applicable. No additional management support is needed unless otherwise documented below in the visit note. 

## 2014-09-29 NOTE — Assessment & Plan Note (Signed)
Lesion of right forearm appears consistent with a seberrhoic keratosis however cannot rule out underlying pathology. Referral to dermatology placed for possible biopsy.

## 2014-09-29 NOTE — Patient Instructions (Signed)
Thank you for choosing [facility].  Summary/Instructions:  Referrals have been made during this visit. You should expect to hear back from our schedulers in about 7-10 days in regards to establishing an appointment with the specialists we discussed.   If your symptoms worsen or fail to improve, please contact our office for further instruction, or in case of emergency go directly to the emergency room at the closest medical facility.   Your lesion appears to be benign, however a follow up with dermatology is recommended for potential biopsy.

## 2014-09-29 NOTE — Progress Notes (Signed)
   Subjective:    Patient ID: Joel Lopez, male    DOB: 07-23-1950, 64 y.o.   MRN: [id number]  Chief Complaint  Patient presents with  . Establish Care    Has a spot on his right arm that he noticed last wednesday, no sxs associated with it    HPI:  Joel Lopez is a 64 y.o. male with a PMH of obstructive sleep apnea, GERD, ruptured biceps tendon, anemia of chronic disease, abdominal hernia and peptic ulcer disease who presents today for an office visit.  This is a new problem. Associated symptom of a spot located on his right arm has been going on for approximately one week. Indicates that it has changed since he originally noticed it. Describes that it may have dried up a little over the course of the week. Denies discharge. The size has stayed about the same. There are no modifying factors that make it better or worse.   No Known Allergies  No current outpatient prescriptions on file prior to visit.   No current facility-administered medications on file prior to visit.    Review of Systems  Constitutional: Negative for fever and chills.  Skin: Positive for rash. Negative for color change.      Objective:    BP 124/84 mmHg  Pulse 66  Temp(Src) 97.8 F (36.6 C) (Oral)  Resp 18  Ht 5\' 11"  (1.803 m)  Wt 188 lb 1.9 oz (85.331 kg)  BMI 26.25 kg/m2  SpO2 98% Nursing note and vital signs reviewed.  Physical Exam  Constitutional: He is oriented to person, place, and time. He appears well-developed and well-nourished. No distress.  Cardiovascular: Normal rate, regular rhythm, normal heart sounds and intact distal pulses.   Pulmonary/Chest: Effort normal and breath sounds normal.  Neurological: He is alert and oriented to person, place, and time.  Skin: Skin is warm and dry.  0.5 cm, papule, tan, with scaly/dry surface and defined borders located on his left posterior forearm.   Psychiatric: He has a normal mood and affect. His behavior is normal. Judgment and thought  content normal.                                                                        Assessment & Plan:   Problem List Items Addressed This Visit      Musculoskeletal and Integument   Skin lesion of right arm - Primary    Lesion of right forearm appears consistent with a seberrhoic keratosis however cannot rule out underlying pathology. Referral to dermatology placed for possible biopsy.      Relevant Orders   Ambulatory referral to Dermatology

## 2015-01-13 ENCOUNTER — Encounter: Payer: Self-pay | Admitting: Family Medicine

## 2015-01-13 ENCOUNTER — Encounter (INDEPENDENT_AMBULATORY_CARE_PROVIDER_SITE_OTHER): Payer: Self-pay

## 2015-01-13 ENCOUNTER — Ambulatory Visit (INDEPENDENT_AMBULATORY_CARE_PROVIDER_SITE_OTHER): Payer: 59 | Admitting: Family Medicine

## 2015-01-13 VITALS — BP 120/68 | HR 72 | Temp 98.4°F | Ht 70.5 in | Wt 181.5 lb

## 2015-01-13 DIAGNOSIS — K4090 Unilateral inguinal hernia, without obstruction or gangrene, not specified as recurrent: Secondary | ICD-10-CM | POA: Diagnosis not present

## 2015-01-13 DIAGNOSIS — Z131 Encounter for screening for diabetes mellitus: Secondary | ICD-10-CM

## 2015-01-13 DIAGNOSIS — Z119 Encounter for screening for infectious and parasitic diseases, unspecified: Secondary | ICD-10-CM

## 2015-01-13 DIAGNOSIS — S46211D Strain of muscle, fascia and tendon of other parts of biceps, right arm, subsequent encounter: Secondary | ICD-10-CM

## 2015-01-13 DIAGNOSIS — R5383 Other fatigue: Secondary | ICD-10-CM

## 2015-01-13 DIAGNOSIS — R011 Cardiac murmur, unspecified: Secondary | ICD-10-CM

## 2015-01-13 DIAGNOSIS — Z7189 Other specified counseling: Secondary | ICD-10-CM

## 2015-01-13 NOTE — Patient Instructions (Signed)
Check with your insurance to see if they will cover the shingles shot. I would get a flu shot each fall.   Schedule a fasting lab visit on the way out.  We'll contact you with your lab report. Rosaria Ferries will call about your referral. Take care.  Glad to see you.

## 2015-01-13 NOTE — Progress Notes (Signed)
Pre visit review using our clinic review tool, if applicable. No additional management support is needed unless otherwise documented below in the visit note.  To est care.  Prev saw Dr. Crissie Sickles.   LIH, known issue for patient.  Now with more discomfort, after exercise.  Larger.  Still soft, he can push it back in.  No severe pain.  No GI sx o/w.   Asking about options.   Fatigue.  Chronic, over the lat few months.  No clear trigger.  Known h/o mild anemia.  No known blood loss.  No FCNAVD.  No other specific sx.    Living will d/w pt.  Would have son Fabrice Dyal designated if patient were incapacitated.    Pt opts in for HCV and HIV screening.  D/w pt re: routine screening.    Flu shot encouraged.    PMH and SH reviewed  ROS: See HPI, otherwise noncontributory.  Meds, vitals, and allergies reviewed.   GEN: nad, alert and oriented HEENT: mucous membranes moist NECK: supple w/o LA CV: rrr.  Faint (minimally audible) 1/6 SEM at LUSB- no known h/o murmur.   PULM: ctab, no inc wob ABD: soft, +bs, soft LIH noted, easily reduced.  EXT: no edema SKIN: no acute rash

## 2015-01-14 ENCOUNTER — Other Ambulatory Visit (INDEPENDENT_AMBULATORY_CARE_PROVIDER_SITE_OTHER): Payer: 59

## 2015-01-14 ENCOUNTER — Other Ambulatory Visit: Payer: Self-pay | Admitting: Family Medicine

## 2015-01-14 DIAGNOSIS — R5383 Other fatigue: Secondary | ICD-10-CM

## 2015-01-14 DIAGNOSIS — Z131 Encounter for screening for diabetes mellitus: Secondary | ICD-10-CM

## 2015-01-14 DIAGNOSIS — Z119 Encounter for screening for infectious and parasitic diseases, unspecified: Secondary | ICD-10-CM

## 2015-01-14 LAB — CBC WITH DIFFERENTIAL/PLATELET
Basophils Absolute: 0 10*3/uL (ref 0.0–0.1)
Basophils Relative: 0.7 % (ref 0.0–3.0)
EOS PCT: 10 % — AB (ref 0.0–5.0)
Eosinophils Absolute: 0.3 10*3/uL (ref 0.0–0.7)
HCT: 39.2 % (ref 39.0–52.0)
Hemoglobin: 12.7 g/dL — ABNORMAL LOW (ref 13.0–17.0)
LYMPHS ABS: 1.2 10*3/uL (ref 0.7–4.0)
Lymphocytes Relative: 37.5 % (ref 12.0–46.0)
MCHC: 32.4 g/dL (ref 30.0–36.0)
MCV: 86.5 fl (ref 78.0–100.0)
MONO ABS: 0.4 10*3/uL (ref 0.1–1.0)
Monocytes Relative: 12 % (ref 3.0–12.0)
NEUTROS PCT: 39.8 % — AB (ref 43.0–77.0)
Neutro Abs: 1.3 10*3/uL — ABNORMAL LOW (ref 1.4–7.7)
PLATELETS: 237 10*3/uL (ref 150.0–400.0)
RBC: 4.54 Mil/uL (ref 4.22–5.81)
RDW: 14.7 % (ref 11.5–15.5)
WBC: 3.3 10*3/uL — ABNORMAL LOW (ref 4.0–10.5)

## 2015-01-14 LAB — GLUCOSE, RANDOM: Glucose, Bld: 82 mg/dL (ref 70–99)

## 2015-01-14 LAB — TSH: TSH: 1.24 u[IU]/mL (ref 0.35–4.50)

## 2015-01-15 ENCOUNTER — Encounter: Payer: Self-pay | Admitting: Family Medicine

## 2015-01-15 DIAGNOSIS — R5383 Other fatigue: Secondary | ICD-10-CM | POA: Insufficient documentation

## 2015-01-15 DIAGNOSIS — R011 Cardiac murmur, unspecified: Secondary | ICD-10-CM | POA: Insufficient documentation

## 2015-01-15 DIAGNOSIS — Z7189 Other specified counseling: Secondary | ICD-10-CM | POA: Insufficient documentation

## 2015-01-15 LAB — HIV ANTIBODY (ROUTINE TESTING W REFLEX): HIV: NONREACTIVE

## 2015-01-15 LAB — HEPATITIS C ANTIBODY: HCV Ab: NEGATIVE

## 2015-01-15 NOTE — Assessment & Plan Note (Signed)
Refer for gen surgery eval.  Anatomy d/w pt.  Okay for outpatient f/u.

## 2015-01-15 NOTE — Assessment & Plan Note (Signed)
Nonspecific.  Would be reasonable to check basic labs given the duration.  No ominous sx, no other B sx.  Okay for outpatient fu.  See notes on labs.

## 2015-01-15 NOTE — Assessment & Plan Note (Signed)
Minimally audible.  D/w pt.  No CP, SOB, BLE edema.  Still with good exercise capacity.   Wouldn't need w/u at this point, ie echo, we can follow clinically.  He agrees.

## 2015-11-18 ENCOUNTER — Encounter: Payer: Self-pay | Admitting: Family Medicine

## 2015-11-18 ENCOUNTER — Ambulatory Visit (INDEPENDENT_AMBULATORY_CARE_PROVIDER_SITE_OTHER): Payer: 59 | Admitting: Family Medicine

## 2015-11-18 DIAGNOSIS — R51 Headache: Secondary | ICD-10-CM | POA: Diagnosis not present

## 2015-11-18 DIAGNOSIS — R519 Headache, unspecified: Secondary | ICD-10-CM | POA: Insufficient documentation

## 2015-11-18 NOTE — Progress Notes (Signed)
Had been playing a lot of tennis.  He thought he had some heat related sx.  HA.  Started about 1 week ago, woke up with the HA.  Constant pain in the meantime.  Conjunctiva injection B prev, some better now. Throbbing B HA, more forehead than anywhere else.  He doesn't typically have HA.  No HA now, it started getting better yesterday.  Had some chills and aches.  He felt prev off balance.  All of that is better today.  No cough, no vomiting.  No rhinorrhea.  No more eye discharge than normal.  Had a tick bite about 2-3 months ago, removed before engorgement. No rash.  No known sick contacts.   Meds, vitals, and allergies reviewed.   ROS: Per HPI unless specifically indicated in ROS section   GEN: nad, alert and oriented HEENT: mucous membranes moist, tm wnl , OP wnl, sinuses not ttp x 4 NECK: supple w/o LA CV: rrr. PULM: ctab, no inc wob ABD: soft, +bs EXT: no edema SKIN: no acute rash

## 2015-11-18 NOTE — Assessment & Plan Note (Signed)
With concurrent chills and aches.  All of that is better today.  Likely was a benign self limited viral illness, possibly exacerbated by mild dehydration/over-exertion.  D/w pt.  Well appearing, f/u prn.  He agrees. With improvement, no w/u needed.

## 2015-11-18 NOTE — Progress Notes (Signed)
Pre visit review using our clinic review tool, if applicable. No additional management support is needed unless otherwise documented below in the visit note. 

## 2015-11-18 NOTE — Patient Instructions (Signed)
Take care.  Glad to see you. Update me as needed.  

## 2015-11-22 ENCOUNTER — Ambulatory Visit (INDEPENDENT_AMBULATORY_CARE_PROVIDER_SITE_OTHER): Payer: 59 | Admitting: Family Medicine

## 2015-11-22 ENCOUNTER — Encounter: Payer: Self-pay | Admitting: Family Medicine

## 2015-11-22 VITALS — BP 122/70 | HR 68 | Temp 98.7°F | Wt 183.8 lb

## 2015-11-22 DIAGNOSIS — T148 Other injury of unspecified body region: Secondary | ICD-10-CM | POA: Diagnosis not present

## 2015-11-22 DIAGNOSIS — W57XXXA Bitten or stung by nonvenomous insect and other nonvenomous arthropods, initial encounter: Secondary | ICD-10-CM | POA: Insufficient documentation

## 2015-11-22 LAB — CBC WITH DIFFERENTIAL/PLATELET
BASOS PCT: 0.8 % (ref 0.0–3.0)
Basophils Absolute: 0 10*3/uL (ref 0.0–0.1)
EOS PCT: 5.5 % — AB (ref 0.0–5.0)
Eosinophils Absolute: 0.2 10*3/uL (ref 0.0–0.7)
HEMATOCRIT: 38.3 % — AB (ref 39.0–52.0)
HEMOGLOBIN: 12.4 g/dL — AB (ref 13.0–17.0)
Lymphocytes Relative: 36.1 % (ref 12.0–46.0)
Lymphs Abs: 1.2 10*3/uL (ref 0.7–4.0)
MCHC: 32.4 g/dL (ref 30.0–36.0)
MCV: 85.3 fl (ref 78.0–100.0)
MONO ABS: 0.5 10*3/uL (ref 0.1–1.0)
MONOS PCT: 14.1 % — AB (ref 3.0–12.0)
Neutro Abs: 1.4 10*3/uL (ref 1.4–7.7)
Neutrophils Relative %: 43.5 % (ref 43.0–77.0)
Platelets: 270 10*3/uL (ref 150.0–400.0)
RBC: 4.49 Mil/uL (ref 4.22–5.81)
RDW: 14.4 % (ref 11.5–15.5)
WBC: 3.3 10*3/uL — AB (ref 4.0–10.5)

## 2015-11-22 LAB — COMPREHENSIVE METABOLIC PANEL
ALK PHOS: 58 U/L (ref 39–117)
ALT: 24 U/L (ref 0–53)
AST: 20 U/L (ref 0–37)
Albumin: 4 g/dL (ref 3.5–5.2)
BILIRUBIN TOTAL: 0.6 mg/dL (ref 0.2–1.2)
BUN: 26 mg/dL — ABNORMAL HIGH (ref 6–23)
CO2: 30 mEq/L (ref 19–32)
CREATININE: 1.15 mg/dL (ref 0.40–1.50)
Calcium: 9.9 mg/dL (ref 8.4–10.5)
Chloride: 106 mEq/L (ref 96–112)
GFR: 82.55 mL/min (ref >60.00)
GLUCOSE: 82 mg/dL (ref 70–99)
Potassium: 4.4 mEq/L (ref 3.5–5.1)
Sodium: 141 mEq/L (ref 135–145)
TOTAL PROTEIN: 7.1 g/dL (ref 6.0–8.3)

## 2015-11-22 NOTE — Assessment & Plan Note (Signed)
Well appearing, all of this could be resolving.  Given the tick bite, check basic labs today.  D/w pt about possible IgG pos, given patient's stated hx RMSF.  Okay for outpatient f/u.  He agrees.

## 2015-11-22 NOTE — Progress Notes (Signed)
Pre visit review using our clinic review tool, if applicable. No additional management support is needed unless otherwise documented below in the visit note. 

## 2015-11-22 NOTE — Progress Notes (Signed)
Prev conjunctiva and HA sx resolved in the meantime, over the last 48 hours.     He has some chills and elevated temp on Thursday, after exercising.  One set of chills on Friday, none since.    More recently with back spasms after last OV, on 8/3-8/5.  None yesterday or today.  Couldn't get comfortable.  Taking tylenol for pain, with relief.  Diffusely over the back, B sides of back.    Prev tick bite hx noted, from May 2017.  H/o RMSF.    Meds, vitals, and allergies reviewed.   ROS: Per HPI unless specifically indicated in ROS section   GEN: nad, alert and oriented HEENT: mucous membranes moist NECK: supple w/o LA CV: rrr. PULM: ctab, no inc wob ABD: soft, +bs EXT: no edema SKIN: no acute rash, tick bite site w/o erythema on the L popliteal area.  Back not ttp

## 2015-11-22 NOTE — Patient Instructions (Signed)
Go to the lab on the way out.  We'll contact you with your lab report. Take care.  Glad to see you.  Update me as needed.   

## 2015-11-23 LAB — LYME AB/WESTERN BLOT REFLEX

## 2015-11-25 LAB — ROCKY MTN SPOTTED FVR ABS PNL(IGG+IGM)
RMSF IGG: NOT DETECTED
RMSF IGM: NOT DETECTED

## 2016-09-25 ENCOUNTER — Encounter: Payer: Self-pay | Admitting: *Deleted

## 2016-10-11 ENCOUNTER — Encounter: Payer: Self-pay | Admitting: Internal Medicine

## 2017-05-20 ENCOUNTER — Other Ambulatory Visit: Payer: Self-pay | Admitting: Family Medicine

## 2017-05-20 DIAGNOSIS — Z8639 Personal history of other endocrine, nutritional and metabolic disease: Secondary | ICD-10-CM

## 2017-05-24 ENCOUNTER — Other Ambulatory Visit (INDEPENDENT_AMBULATORY_CARE_PROVIDER_SITE_OTHER): Payer: 59

## 2017-05-24 DIAGNOSIS — Z8639 Personal history of other endocrine, nutritional and metabolic disease: Secondary | ICD-10-CM | POA: Diagnosis not present

## 2017-05-24 LAB — COMPREHENSIVE METABOLIC PANEL
ALT: 19 U/L (ref 0–53)
AST: 19 U/L (ref 0–37)
Albumin: 3.9 g/dL (ref 3.5–5.2)
Alkaline Phosphatase: 65 U/L (ref 39–117)
BUN: 22 mg/dL (ref 6–23)
CHLORIDE: 107 meq/L (ref 96–112)
CO2: 30 meq/L (ref 19–32)
Calcium: 9.5 mg/dL (ref 8.4–10.5)
Creatinine, Ser: 1.05 mg/dL (ref 0.40–1.50)
GFR: 91.25 mL/min (ref >60.00)
GLUCOSE: 83 mg/dL (ref 70–99)
POTASSIUM: 4.7 meq/L (ref 3.5–5.1)
SODIUM: 142 meq/L (ref 135–145)
TOTAL PROTEIN: 6.9 g/dL (ref 6.0–8.3)
Total Bilirubin: 0.6 mg/dL (ref 0.2–1.2)

## 2017-05-24 LAB — LIPID PANEL
CHOL/HDL RATIO: 4
Cholesterol: 172 mg/dL (ref 0–200)
HDL: 42.9 mg/dL (ref >39.00)
LDL Cholesterol: 116 mg/dL — ABNORMAL HIGH (ref 0–99)
NONHDL: 129.03
Triglycerides: 65 mg/dL (ref 0.0–149.0)
VLDL: 13 mg/dL (ref 0.0–40.0)

## 2017-05-25 ENCOUNTER — Ambulatory Visit (INDEPENDENT_AMBULATORY_CARE_PROVIDER_SITE_OTHER): Payer: 59 | Admitting: Family Medicine

## 2017-05-25 ENCOUNTER — Encounter: Payer: Self-pay | Admitting: Family Medicine

## 2017-05-25 VITALS — BP 118/76 | HR 66 | Temp 98.5°F | Ht 71.0 in | Wt 197.2 lb

## 2017-05-25 DIAGNOSIS — Z23 Encounter for immunization: Secondary | ICD-10-CM

## 2017-05-25 DIAGNOSIS — Z7189 Other specified counseling: Secondary | ICD-10-CM

## 2017-05-25 DIAGNOSIS — Z0001 Encounter for general adult medical examination with abnormal findings: Secondary | ICD-10-CM | POA: Diagnosis not present

## 2017-05-25 DIAGNOSIS — K4090 Unilateral inguinal hernia, without obstruction or gangrene, not specified as recurrent: Secondary | ICD-10-CM

## 2017-05-25 DIAGNOSIS — Z125 Encounter for screening for malignant neoplasm of prostate: Secondary | ICD-10-CM

## 2017-05-25 DIAGNOSIS — R5383 Other fatigue: Secondary | ICD-10-CM | POA: Diagnosis not present

## 2017-05-25 LAB — CBC WITH DIFFERENTIAL/PLATELET
BASOS PCT: 1.3 %
Basophils Absolute: 48 cells/uL (ref 0–200)
EOS PCT: 7 %
Eosinophils Absolute: 259 cells/uL (ref 15–500)
HEMATOCRIT: 38.6 % (ref 38.5–50.0)
HEMOGLOBIN: 13.1 g/dL — AB (ref 13.2–17.1)
LYMPHS ABS: 1317 {cells}/uL (ref 850–3900)
MCH: 27.8 pg (ref 27.0–33.0)
MCHC: 33.9 g/dL (ref 32.0–36.0)
MCV: 81.8 fL (ref 80.0–100.0)
MONOS PCT: 12.3 %
MPV: 11.5 fL (ref 7.5–12.5)
NEUTROS ABS: 1621 {cells}/uL (ref 1500–7800)
Neutrophils Relative %: 43.8 %
Platelets: 278 10*3/uL (ref 140–400)
RBC: 4.72 10*6/uL (ref 4.20–5.80)
RDW: 13 % (ref 11.0–15.0)
Total Lymphocyte: 35.6 %
WBC mixed population: 455 cells/uL (ref 200–950)
WBC: 3.7 10*3/uL — ABNORMAL LOW (ref 3.8–10.8)

## 2017-05-25 LAB — PSA: PSA: 0.66 ng/mL (ref 0.10–4.00)

## 2017-05-25 LAB — TSH: TSH: 0.81 u[IU]/mL (ref 0.35–4.50)

## 2017-05-25 NOTE — Patient Instructions (Addendum)
Call about a follow up colonoscopy.   Check with your insurance to see if they will cover the shingrix shot. Pneumonia shot due at 59 (prevnar 13) Go to the lab on the way out.  We'll contact you with your lab report. Take care.  Glad to see you.

## 2017-05-25 NOTE — Progress Notes (Signed)
CPE- See plan.  Routine anticipatory guidance given to patient.  See health maintenance.  The possibility exists that previously documented standard health maintenance information may have been brought forward from a previous encounter into this note.  If needed, that same information has been updated to reflect the current situation based on today's encounter.    Tetanus 2010 Flu today.   PNA due at 67 Shingles d/w pt.  See AVS.  PSA pending, d/w pt about screening.   Colon cancer screening.  Due for follow up colonoscopy screening.  See avs.  Living will d/w pt. Would have his sister Joel Lopez designated if patient were incapacitated.   Diet and exercise d/w pt.  Working on both, "I'm doing pretty good."  HIV and HCV prev neg, d/w pt.    L inguinal hernia.  Locally puffy, can reduce when supine.  It is starting to be more noticeable, esp if playing a lot of tennis.  D/w pt about elective repair, when he is ready for that.    He was prev tested for OSA, couldn't tolerate cpap, but is sleeping better now.  Not waking from snoring but still with sig fatigue noted, d/w pt.  Nocturia 1-2x/night, depending on fluid intake.  Likely the L greater troch bursitis pain causing some sleep changes, if rolling onto L side.  D/w pt about icing.  Has had sx for years, used to be worse in the past.    PMH and SH reviewed  Meds, vitals, and allergies reviewed.   ROS: Per HPI.  Unless specifically indicated otherwise in HPI, the patient denies:  General: fever. Eyes: acute vision changes ENT: sore throat Cardiovascular: chest pain Respiratory: SOB GI: vomiting GU: dysuria Musculoskeletal: acute back pain Derm: acute rash Neuro: acute motor dysfunction Psych: worsening mood Endocrine: polydipsia Heme: bleeding Allergy: hayfever  GEN: nad, alert and oriented HEENT: mucous membranes moist NECK: supple w/o LA CV: rrr. PULM: ctab, no inc wob ABD: soft, +bs EXT: no edema SKIN: no acute  rash Soft left inguinal hernia noted, easily reduced.  Not incarcerated.

## 2017-05-27 DIAGNOSIS — K4090 Unilateral inguinal hernia, without obstruction or gangrene, not specified as recurrent: Secondary | ICD-10-CM | POA: Insufficient documentation

## 2017-05-27 NOTE — Assessment & Plan Note (Signed)
Discussed with patient about options.  He is likely going to need repair at some point.  He likely makes sense to get repair done before he has significant increase in symptoms, but he could likely still put off the repair for a while now.  When he wants to go to general surgery we can refer him.  Routine cautions given.  He agrees.

## 2017-05-27 NOTE — Assessment & Plan Note (Signed)
He was previously tested for sleep apnea but could not tolerate CPAP.  He is sleeping better now in the meantime.  He does have some nocturia and he likely has left greater trochanteric bursitis that affects his sleep.  Discussed with him about icing his hip to see if that helps his pain and keeps him from waking at night.  Reasonable to recheck routine labs in the meantime regarding fatigue.  Update me as needed.  He agrees.

## 2017-05-27 NOTE — Assessment & Plan Note (Signed)
Living will d/w pt. Would have his sister Jacqueline Bell designated if patient were incapacitated.    °

## 2017-05-27 NOTE — Assessment & Plan Note (Signed)
Tetanus 2010 Flu today.   PNA due at 67 Shingles d/w pt.  See AVS.  PSA pending, d/w pt about screening.   Colon cancer screening.  Due for follow up colonoscopy screening.   Living will d/w pt. Would have [name] designated if patient were incapacitated.   Diet and exercise d/w pt.  Working on both, "I'm doing pretty good."  HIV and HCV prev neg, d/w pt.

## 2017-08-27 ENCOUNTER — Telehealth: Payer: Self-pay

## 2017-08-27 NOTE — Telephone Encounter (Signed)
Copied from [place] [id number]. Topic: Inquiry >> Aug 27, 2017 11:16 AM Scherrie Gerlach wrote: Reason for CRM: pt would like to know how the dr feels about pt doing the "life Line Screening" Has something to do with plaque build up.  Wants to know if Dr thinks its a good thing s=for the pt. They are coming to the [facility] on Friday. Please advise

## 2017-08-27 NOTE — Telephone Encounter (Signed)
He shouldn't need to get it done.  My opinion isn't the major factor here.  The guidelines are more important and they wouldn't rec screening for this patient.  Thanks.

## 2017-08-27 NOTE — Telephone Encounter (Signed)
Patient notified as instructed by telephone and verbalized understanding. 

## 2017-11-26 ENCOUNTER — Encounter: Payer: Self-pay | Admitting: Internal Medicine

## 2017-12-31 ENCOUNTER — Ambulatory Visit (INDEPENDENT_AMBULATORY_CARE_PROVIDER_SITE_OTHER): Payer: 59 | Admitting: Sports Medicine

## 2017-12-31 ENCOUNTER — Encounter

## 2017-12-31 VITALS — BP 132/80 | Ht 71.0 in | Wt 182.0 lb

## 2017-12-31 DIAGNOSIS — M7652 Patellar tendinitis, left knee: Secondary | ICD-10-CM | POA: Diagnosis not present

## 2017-12-31 NOTE — Progress Notes (Signed)
   Subjective:    Patient ID: Joel Lopez, male    DOB: 1950-06-15, 67 y.o.   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  He does not recall any specific injury but describes a gradual onset of pain that he localizes to the anterior knee.  Pain is worse with playing tennis.  He took the past week off and his pain has improved dramatically.  He has been playing tennis for 25 years.  In addition to tennis, he enjoys working out.  He has noticed that leg extensions in the gym will also cause pain.  He has noticed some mild swelling.  No prior knee surgeries.  In addition to rest, he has tried both ice and Advil both of which have been helpful.  Past medical history reviewed Medications reviewed Allergies reviewed    Review of Systems    As above Objective:   Physical Exam  Well-developed, well-nourished.  No acute distress.  Awake alert and oriented x3.  Vital signs reviewed  Left knee: Full range of motion.  No effusion.  No tenderness to palpation along the patellar tendon.  No tenderness along medial or lateral joint lines.  Knee is stable to valgus and varus stressing.  Negative anterior drawer, negative posterior drawer.  Negative McMurray's.  No significant patellofemoral crepitus.  Neurovascularly intact distally.  Walking without a limp.  MSK ultrasound of the left knee was performed.  Limited images were obtained.  There is no obvious joint effusion.  Visualized portions of medial and lateral meniscus are unremarkable.  No spurring seen at the medial or lateral joint lines.  There is some calcific tendinopathy in the proximal patellar tendon.  It primarily involves the midportion of the tendon.  There are hypoechoic changes seen around the calcium deposit.       Assessment & Plan:   Left knee pain secondary to calcific patellar tendinopathy  Patellar strap to wear  with tennis.  I think he should wait an additional week before returning to tennis.  He will initially play on soft courts.  He will slowly reintroduce his leg exercises in the gym.  Continue with ice and Advil as needed.  When resuming tennis he will need to gradually return and use pain as his guide.  Follow-up with me in 6 weeks for reevaluation.  Call with questions or concerns in the interim.

## 2018-01-07 ENCOUNTER — Ambulatory Visit: Payer: 59 | Admitting: Sports Medicine

## 2018-01-10 ENCOUNTER — Encounter: Payer: Self-pay | Admitting: Internal Medicine

## 2018-01-10 ENCOUNTER — Ambulatory Visit (AMBULATORY_SURGERY_CENTER): Payer: Self-pay

## 2018-01-10 VITALS — Ht 71.0 in | Wt 185.6 lb

## 2018-01-10 DIAGNOSIS — Z1211 Encounter for screening for malignant neoplasm of colon: Secondary | ICD-10-CM

## 2018-01-10 MED ORDER — NA SULFATE-K SULFATE-MG SULF 17.5-3.13-1.6 GM/177ML PO SOLN: 1.0000 | Freq: Once | ORAL | 0 refills | Status: AC

## 2018-01-10 NOTE — Progress Notes (Signed)
Denies allergies to eggs or soy products. Denies complication of anesthesia or sedation. Denies use of weight loss medication. Denies use of O2.   Emmi instructions declined.  

## 2018-01-17 ENCOUNTER — Ambulatory Visit: Payer: 59 | Admitting: Sports Medicine

## 2018-01-24 ENCOUNTER — Ambulatory Visit (AMBULATORY_SURGERY_CENTER): Payer: 59 | Admitting: Internal Medicine

## 2018-01-24 ENCOUNTER — Encounter: Payer: Self-pay | Admitting: Internal Medicine

## 2018-01-24 VITALS — BP 98/50 | HR 57 | Temp 97.8°F | Resp 16 | Ht 71.0 in | Wt 185.0 lb

## 2018-01-24 DIAGNOSIS — D127 Benign neoplasm of rectosigmoid junction: Secondary | ICD-10-CM

## 2018-01-24 DIAGNOSIS — Z1211 Encounter for screening for malignant neoplasm of colon: Secondary | ICD-10-CM

## 2018-01-24 DIAGNOSIS — D122 Benign neoplasm of ascending colon: Secondary | ICD-10-CM

## 2018-01-24 MED ORDER — SODIUM CHLORIDE 0.9 % IV SOLN: 500.0000 mL | Freq: Once | INTRAVENOUS | Status: DC

## 2018-01-24 NOTE — Op Note (Signed)
[name] Procedure Date: 01/24/2018 10:27 AM MRN: [id number] Endoscopist: Jerene Bears , MD Age: 67 Referring MD:  Date of Birth: 1950/05/21 Gender: Male Account #: [id number] Procedure:                Colonoscopy Indications:              Screening for colorectal malignant neoplasm, Last                            colonoscopy: 2008 Medicines:                Monitored Anesthesia Care Procedure:                Pre-Anesthesia Assessment:                           - Prior to the procedure, a History and Physical                            was performed, and patient medications and                            allergies were reviewed. The patient's tolerance of                            previous anesthesia was also reviewed. The risks                            and benefits of the procedure and the sedation                            options and risks were discussed with the patient.                            All questions were answered, and informed consent                            was obtained. Prior Anticoagulants: The patient has                            taken no previous anticoagulant or antiplatelet                            agents. ASA Grade Assessment: II - A patient with                            mild systemic disease. After reviewing the risks                            and benefits, the patient was deemed in                            satisfactory condition to undergo the procedure.  After obtaining informed consent, the colonoscope                            was passed under direct vision. Throughout the                            procedure, the patient's blood pressure, pulse, and                            oxygen saturations were monitored continuously. The                            Colonoscope was introduced through the anus and                            advanced to the cecum, identified by  appendiceal                            orifice and ileocecal valve. The colonoscopy was                            performed without difficulty. The patient tolerated                            the procedure well. The quality of the bowel                            preparation was good. The ileocecal valve,                            appendiceal orifice, and rectum were photographed. Scope In: 10:44:40 AM Scope Out: 10:59:45 AM Scope Withdrawal Time: 0 hours 11 minutes 29 seconds  Total Procedure Duration: 0 hours 15 minutes 5 seconds  Findings:                 A 3 mm polyp was found in the ascending colon. The                            polyp was sessile. The polyp was removed with a                            cold snare. Resection and retrieval were complete.                           A 6 mm polyp was found in the recto-sigmoid colon.                            The polyp was sessile. The polyp was removed with a                            cold snare. Resection and retrieval were complete.  Internal hemorrhoids were found during                            retroflexion. The hemorrhoids were small. Complications:            No immediate complications. Estimated Blood Loss:     Estimated blood loss was minimal. Impression:               - One 3 mm polyp in the ascending colon, removed                            with a cold snare. Resected and retrieved.                           - One 6 mm polyp at the recto-sigmoid colon,                            removed with a cold snare. Resected and retrieved.                           - Small internal hemorrhoids. Recommendation:           - Patient has a contact number available for                            emergencies. The signs and symptoms of potential                            delayed complications were discussed with the                            patient. Return to normal activities tomorrow.                             Written discharge instructions were provided to the                            patient.                           - Resume previous diet.                           - Continue present medications.                           - Await pathology results.                           - Repeat colonoscopy is recommended. The                            colonoscopy date will be determined after pathology                            results from today's exam become available for  review. Jerene Bears, MD 01/24/2018 11:07:14 AM This report has been signed electronically.

## 2018-01-24 NOTE — Progress Notes (Signed)
Pt's states no medical or surgical changes since previsit or office visit. 

## 2018-01-24 NOTE — Progress Notes (Signed)
A/ox3 pleased with MAC, report to RN 

## 2018-01-24 NOTE — Progress Notes (Signed)
Called to room to assist during endoscopic procedure.  Patient ID and intended procedure confirmed with present staff. Received instructions for my participation in the procedure from the performing physician.  

## 2018-01-24 NOTE — Patient Instructions (Signed)
**   Handouts given on polyps and diverticulosis **   YOU HAD AN ENDOSCOPIC PROCEDURE TODAY AT THE [facility]:   Refer to the procedure report that was given to you for any specific questions about what was found during the examination.  If the procedure report does not answer your questions, please call your gastroenterologist to clarify.  If you requested that your care partner not be given the details of your procedure findings, then the procedure report has been included in a sealed envelope for you to review at your convenience later.  YOU SHOULD EXPECT: Some feelings of bloating in the abdomen. Passage of more gas than usual.  Walking can help get rid of the air that was put into your GI tract during the procedure and reduce the bloating. If you had a lower endoscopy (such as a colonoscopy or flexible sigmoidoscopy) you may notice spotting of blood in your stool or on the toilet paper. If you underwent a bowel prep for your procedure, you may not have a normal bowel movement for a few days.  Please Note:  You might notice some irritation and congestion in your nose or some drainage.  This is from the oxygen used during your procedure.  There is no need for concern and it should clear up in a day or so.  SYMPTOMS TO REPORT IMMEDIATELY:   Following lower endoscopy (colonoscopy or flexible sigmoidoscopy):  Excessive amounts of blood in the stool  Significant tenderness or worsening of abdominal pains  Swelling of the abdomen that is new, acute  Fever of 100F or higher  For urgent or emergent issues, a gastroenterologist can be reached at any hour by calling [phone].   DIET:  We do recommend a small meal at first, but then you may proceed to your regular diet.  Drink plenty of fluids but you should avoid alcoholic beverages for 24 hours.  ACTIVITY:  You should plan to take it easy for the rest of today and you should NOT DRIVE or use heavy machinery until tomorrow (because  of the sedation medicines used during the test).    FOLLOW UP: Our staff will call the number listed on your records the next business day following your procedure to check on you and address any questions or concerns that you may have regarding the information given to you following your procedure. If we do not reach you, we will leave a message.  However, if you are feeling well and you are not experiencing any problems, there is no need to return our call.  We will assume that you have returned to your regular daily activities without incident.  If any biopsies were taken you will be contacted by phone or by letter within the next 1-3 weeks.  Please call us at [phone] if you have not heard about the biopsies in 3 weeks.    SIGNATURES/CONFIDENTIALITY: You and/or your care partner have signed paperwork which will be entered into your electronic medical record.  These signatures attest to the fact that that the information above on your After Visit Summary has been reviewed and is understood.  Full responsibility of the confidentiality of this discharge information lies with you and/or your care-partner. 

## 2018-01-25 ENCOUNTER — Telehealth: Payer: Self-pay | Admitting: *Deleted

## 2018-01-25 NOTE — Telephone Encounter (Signed)
  Follow up Call-  Call back number 01/24/2018  Post procedure Call Back phone  # [phone]  Permission to leave phone message Yes  Some recent data might be hidden     Patient questions:  Do you have a fever, pain , or abdominal swelling?no Pain Score  0 *  Have you tolerated food without any problems? Yes.    Have you been able to return to your normal activities? Yes.    Do you have any questions about your discharge instructions: Diet   No. Medications  No. Follow up visit  No.  Do you have questions or concerns about your Care? No.  Actions: * If pain score is 4 or above: No action needed, pain <4.

## 2018-01-28 ENCOUNTER — Ambulatory Visit
Admission: RE | Admit: 2018-01-28 | Discharge: 2018-01-28 | Disposition: A | Discharge status: Home / Self Care | Payer: 59 | Source: Ambulatory Visit | Attending: Sports Medicine | Admitting: Sports Medicine

## 2018-01-28 ENCOUNTER — Ambulatory Visit (INDEPENDENT_AMBULATORY_CARE_PROVIDER_SITE_OTHER): Payer: 59 | Admitting: Sports Medicine

## 2018-01-28 VITALS — BP 130/80 | Ht 71.0 in | Wt 182.0 lb

## 2018-01-28 DIAGNOSIS — M7652 Patellar tendinitis, left knee: Secondary | ICD-10-CM | POA: Diagnosis not present

## 2018-01-28 MED ORDER — NITROGLYCERIN 0.2 MG/HR TD PT24: MEDICATED_PATCH | TRANSDERMAL | 1 refills | Status: DC

## 2018-01-28 NOTE — Patient Instructions (Signed)

## 2018-01-28 NOTE — Progress Notes (Addendum)
   Subjective:    Patient ID: Joel Lopez, male    DOB: [birth year], 67 y.o.   MRN: [id number]  HPI  Patient comes in today for follow-up on left knee calcific tendinopathy.  He still struggling with anterior knee pain, especially with tennis.  He is able to play through the pain but he is favoring his knee.  He does endorse swelling of the leg as well.  Pain is minimal when not playing tennis.  Previous ultrasound showed calcific tendinopathy of the patellar tendon but no joint effusion.  He has been doing his home exercises but they have not been helpful.   Review of Systems    As above Objective:   Physical Exam  Well-developed, well-nourished.  No acute distress.  Awake alert and oriented x3.  Vital signs reviewed  Left knee: Full range of motion.  There is no obvious effusion.  He is tender to palpation at the inferior pole of patella at the origin of the patellar tendon.  No soft tissue swelling.  No joint line tenderness.  Negative McMurray's.  Knee is stable ligamentous exam.  Neurovascularly intact distally.      Assessment & Plan:   Persistent left knee pain with ultrasound evidence of calcific tendinopathy  I discussed further work-up and treatment with the patient.  Going to start with getting x-rays of the left knee.  I will call him with those results when available.  Depending on those results, I may consider further diagnostic imaging in the form of an MRI.  Alternatively, we may consider formal physical therapy.  I am going to go ahead and start topical nitroglycerin.  Patient has had good success with this in the past.  He will start with a quarter patch daily.  Addendum: X-rays of the left knee do confirm calcific tendinopathy of the patellar tendon.  Minimal intra-articular degenerative changes.  Patient will go ahead and start physical therapy (question Barbaraann Barthel) and follow-up with me in 4 weeks.

## 2018-01-30 ENCOUNTER — Encounter: Payer: Self-pay | Admitting: Internal Medicine

## 2018-02-15 ENCOUNTER — Encounter: Payer: Self-pay | Admitting: Family Medicine

## 2018-02-15 ENCOUNTER — Ambulatory Visit (INDEPENDENT_AMBULATORY_CARE_PROVIDER_SITE_OTHER)
Admission: RE | Admit: 2018-02-15 | Discharge: 2018-02-15 | Disposition: A | Discharge status: Home / Self Care | Payer: 59 | Source: Ambulatory Visit | Attending: Family Medicine | Admitting: Family Medicine

## 2018-02-15 ENCOUNTER — Ambulatory Visit: Payer: 59 | Admitting: Family Medicine

## 2018-02-15 VITALS — BP 112/76 | HR 66 | Temp 98.2°F | Ht 71.0 in | Wt 195.0 lb

## 2018-02-15 DIAGNOSIS — M79641 Pain in right hand: Secondary | ICD-10-CM | POA: Diagnosis not present

## 2018-02-15 DIAGNOSIS — Z23 Encounter for immunization: Secondary | ICD-10-CM | POA: Diagnosis not present

## 2018-02-15 NOTE — Patient Instructions (Signed)
Try icing your hand before and after exercise.  Aleve 1 pill twice a day with food if needed.  Update me next week.  You may need to rest from tennis after your tournament.  Take care.  Glad to see you.

## 2018-02-15 NOTE — Progress Notes (Signed)
R hand pain, just proximal 2nd MCP.  Going on for a few months.  No known injury.  Normal sensation.  Grip is still normal.  Never red, bruised, or puffy.  H/o gout but not like this.    Some occ pain along the R 4th ray.  No pain on the palmar side.   Some pain with playing tennis.  He has a Doctor, hospital tournament coming up.  He is motivated to keep playing.  R handed.   Meds, vitals, and allergies reviewed.   ROS: Per HPI unless specifically indicated in ROS section   nad ncat R arm with normal range of motion at the elbow wrist and hand.  Wrist is not tender.  Normal inspection of the hand.  No bruising.  No swelling or erythema.  Distally neurovascular intact.  Normal sensation.  No tendon deficit on testing of the fingers.  He does have some tenderness dorsally, just proximal to the right second MCP.  The second MCP itself is not tender bruised or swollen.  He has some minimal tenderness along the tendon sheath on the dorsal side of the fourth metacarpal.  No pain on the palmar side.

## 2018-02-17 DIAGNOSIS — M79641 Pain in right hand: Secondary | ICD-10-CM | POA: Insufficient documentation

## 2018-02-17 NOTE — Assessment & Plan Note (Signed)
Likely tendinitis.  We talked about options.  He is motivated to keep playing tennis in the near future.  After that he can rest his hand more.  Would be okay to use ice before and after exercise.  He can take Aleve 1 tab twice a day if needed with food.  Routine cautions given.  Imaging discussed with patient at office visit, no acute findings.  Okay for outpatient follow-up.  Update me as needed.

## 2018-04-03 ENCOUNTER — Ambulatory Visit: Payer: Self-pay | Admitting: General Surgery

## 2018-04-22 NOTE — Pre-Procedure Instructions (Signed)
Dick Hark Mackert  04/22/2018      [id number] [address], [place] [address] Phone: [phone] Fax: [phone]    Your procedure is scheduled on January 10th.  Report to [facility] Admitting at 0830 A.M.  Call this number if you have problems the morning of surgery:  [phone]   Remember:  Do not eat after midnight.  You may drink clear liquids until 0730am .  Clear liquids allowed are:                    Water, Juice (non-citric and without pulp), Carbonated beverages, Clear Tea, Black Coffee only and Gatorade    Take these medicines the morning of surgery with A SIP OF WATER  None    Do not wear jewelry.  Do not wear lotions, powders, or colognes, or deodorant.  Men may shave face and neck.  Do not bring valuables to the hospital.  [facility] is not responsible for any belongings or valuables.  Contacts, dentures or bridgework may not be worn into surgery.  Leave your suitcase in the car.  After surgery it may be brought to your room.  For patients admitted to the hospital, discharge time will be determined by your treatment team.  Patients discharged the day of surgery will not be allowed to drive home.    [facility]- Preparing For Surgery  Before surgery, you can play an important role. Because skin is not sterile, your skin needs to be as free of germs as possible. You can reduce the number of germs on your skin by washing with CHG (chlorahexidine gluconate) Soap before surgery.  CHG is an antiseptic cleaner which kills germs and bonds with the skin to continue killing germs even after washing.    Oral Hygiene is also important to reduce your risk of infection.  Remember - BRUSH YOUR TEETH THE MORNING OF SURGERY WITH YOUR REGULAR TOOTHPASTE  Please do not use if you have an allergy to CHG or antibacterial soaps. If your skin becomes reddened/irritated stop using the CHG.  Do not shave  (including legs and underarms) for at least 48 hours prior to first CHG shower. It is OK to shave your face.  Please follow these instructions carefully.   1. Shower the NIGHT BEFORE SURGERY and the MORNING OF SURGERY with CHG.   2. If you chose to wash your hair, wash your hair first as usual with your normal shampoo.  3. After you shampoo, rinse your hair and body thoroughly to remove the shampoo.  4. Use CHG as you would any other liquid soap. You can apply CHG directly to the skin and wash gently with a scrungie or a clean washcloth.   5. Apply the CHG Soap to your body ONLY FROM THE NECK DOWN.  Do not use on open wounds or open sores. Avoid contact with your eyes, ears, mouth and genitals (private parts). Wash Face and genitals (private parts)  with your normal soap.  6. Wash thoroughly, paying special attention to the area where your surgery will be performed.  7. Thoroughly rinse your body with warm water from the neck down.  8. DO NOT shower/wash with your normal soap after using and rinsing off the CHG Soap.  9. Pat yourself dry with a CLEAN TOWEL.  10. Wear CLEAN PAJAMAS to bed the night before surgery, wear comfortable clothes the morning of surgery  11. Place CLEAN SHEETS on your bed the night of your first shower and DO NOT SLEEP WITH PETS.    Day of Surgery:  Do not apply any deodorants/lotions.  Please wear clean clothes to the hospital/surgery center.   Remember to brush your teeth WITH YOUR REGULAR TOOTHPASTE.    Please read over the following fact sheets that you were given.

## 2018-04-23 ENCOUNTER — Encounter (HOSPITAL_COMMUNITY): Payer: Self-pay

## 2018-04-23 ENCOUNTER — Encounter (HOSPITAL_COMMUNITY)
Admission: RE | Admit: 2018-04-23 | Discharge: 2018-04-23 | Disposition: A | Discharge status: Home / Self Care | Payer: 59 | Source: Ambulatory Visit | Attending: General Surgery | Admitting: General Surgery

## 2018-04-23 ENCOUNTER — Other Ambulatory Visit: Payer: Self-pay

## 2018-04-23 DIAGNOSIS — Z01812 Encounter for preprocedural laboratory examination: Secondary | ICD-10-CM | POA: Insufficient documentation

## 2018-04-23 DIAGNOSIS — K219 Gastro-esophageal reflux disease without esophagitis: Secondary | ICD-10-CM | POA: Diagnosis not present

## 2018-04-23 DIAGNOSIS — Z79899 Other long term (current) drug therapy: Secondary | ICD-10-CM | POA: Diagnosis not present

## 2018-04-23 DIAGNOSIS — K4090 Unilateral inguinal hernia, without obstruction or gangrene, not specified as recurrent: Secondary | ICD-10-CM | POA: Diagnosis present

## 2018-04-23 DIAGNOSIS — G4730 Sleep apnea, unspecified: Secondary | ICD-10-CM | POA: Diagnosis not present

## 2018-04-23 LAB — BASIC METABOLIC PANEL
Anion gap: 9 (ref 5–15)
BUN: 18 mg/dL (ref 8–23)
CHLORIDE: 107 mmol/L (ref 98–111)
CO2: 25 mmol/L (ref 22–32)
Calcium: 9.6 mg/dL (ref 8.9–10.3)
Creatinine, Ser: 0.94 mg/dL (ref 0.61–1.24)
GFR calc Af Amer: >60 mL/min (ref >60)
GFR calc non Af Amer: >60 mL/min (ref >60)
Glucose, Bld: 91 mg/dL (ref 70–99)
POTASSIUM: 3.8 mmol/L (ref 3.5–5.1)
Sodium: 141 mmol/L (ref 135–145)

## 2018-04-23 LAB — URINALYSIS, ROUTINE W REFLEX MICROSCOPIC
Bilirubin Urine: NEGATIVE
Glucose, UA: NEGATIVE mg/dL
Hgb urine dipstick: NEGATIVE
Ketones, ur: NEGATIVE mg/dL
Leukocytes, UA: NEGATIVE
Nitrite: NEGATIVE
Protein, ur: NEGATIVE mg/dL
SPECIFIC GRAVITY, URINE: 1.028 (ref 1.005–1.030)
pH: 5 (ref 5.0–8.0)

## 2018-04-23 LAB — CBC WITH DIFFERENTIAL/PLATELET
Abs Immature Granulocytes: 0.01 10*3/uL (ref 0.00–0.07)
Basophils Absolute: 0 10*3/uL (ref 0.0–0.1)
Basophils Relative: 1 %
Eosinophils Absolute: 0.4 10*3/uL (ref 0.0–0.5)
Eosinophils Relative: 10 %
HEMATOCRIT: 42.5 % (ref 39.0–52.0)
Hemoglobin: 13 g/dL (ref 13.0–17.0)
Immature Granulocytes: 0 %
Lymphocytes Relative: 35 %
Lymphs Abs: 1.4 10*3/uL (ref 0.7–4.0)
MCH: 26.6 pg (ref 26.0–34.0)
MCHC: 30.6 g/dL (ref 30.0–36.0)
MCV: 86.9 fL (ref 80.0–100.0)
MONO ABS: 0.6 10*3/uL (ref 0.1–1.0)
Monocytes Relative: 14 %
Neutro Abs: 1.7 10*3/uL (ref 1.7–7.7)
Neutrophils Relative %: 40 %
Platelets: 286 10*3/uL (ref 150–400)
RBC: 4.89 MIL/uL (ref 4.22–5.81)
RDW: 13.6 % (ref 11.5–15.5)
WBC: 4.1 10*3/uL (ref 4.0–10.5)
nRBC: 0 % (ref 0.0–0.2)

## 2018-04-23 NOTE — Progress Notes (Signed)
PCP - Dr. Damita Dunnings  Cardiologist - Denies  Chest x-ray - Denies  EKG - Denies  Stress Test - Denies  ECHO - Denies  Cardiac Cath - Denies  AICD- na PM- na LOOP- na  Sleep Study - Yes- Positve CPAP - None  LABS- 04/23/2018: CBC w/D, BMP  ASA- Denies   Anesthesia- No  Pt denies having chest pain, sob, or fever at this time. All instructions explained to the pt, with a verbal understanding of the material. Pt agrees to go over the instructions while at home for a better understanding. The opportunity to ask questions was provided.

## 2018-04-26 ENCOUNTER — Encounter (HOSPITAL_COMMUNITY): Payer: Self-pay | Admitting: *Deleted

## 2018-04-26 ENCOUNTER — Ambulatory Visit (HOSPITAL_COMMUNITY): Payer: 59 | Admitting: Certified Registered"

## 2018-04-26 ENCOUNTER — Encounter (HOSPITAL_COMMUNITY)
Admission: RE | Disposition: A | Discharge status: Home / Self Care | Payer: Self-pay | Source: Home / Self Care | Attending: General Surgery

## 2018-04-26 ENCOUNTER — Other Ambulatory Visit: Payer: Self-pay

## 2018-04-26 ENCOUNTER — Ambulatory Visit (HOSPITAL_COMMUNITY)
Admission: RE | Admit: 2018-04-26 | Discharge: 2018-04-26 | Disposition: A | Discharge status: Home / Self Care | Payer: 59 | Attending: General Surgery | Admitting: General Surgery

## 2018-04-26 DIAGNOSIS — G4730 Sleep apnea, unspecified: Secondary | ICD-10-CM | POA: Insufficient documentation

## 2018-04-26 DIAGNOSIS — Z79899 Other long term (current) drug therapy: Secondary | ICD-10-CM | POA: Insufficient documentation

## 2018-04-26 DIAGNOSIS — K219 Gastro-esophageal reflux disease without esophagitis: Secondary | ICD-10-CM | POA: Insufficient documentation

## 2018-04-26 DIAGNOSIS — K4090 Unilateral inguinal hernia, without obstruction or gangrene, not specified as recurrent: Secondary | ICD-10-CM | POA: Insufficient documentation

## 2018-04-26 HISTORY — PX: INSERTION OF MESH: SHX5868

## 2018-04-26 HISTORY — PX: INGUINAL HERNIA REPAIR: SHX194

## 2018-04-26 SURGERY — REPAIR, HERNIA, INGUINAL, ADULT
Anesthesia: Regional | Laterality: Left

## 2018-04-26 MED ORDER — PROPOFOL 10 MG/ML IV BOLUS
INTRAVENOUS | Status: AC
  Filled 2018-04-26: qty 20

## 2018-04-26 MED ORDER — LIDOCAINE 2% (20 MG/ML) 5 ML SYRINGE
INTRAMUSCULAR | Status: AC
  Filled 2018-04-26: qty 10

## 2018-04-26 MED ORDER — MIDAZOLAM HCL 2 MG/2ML IJ SOLN
INTRAMUSCULAR | Status: AC
  Administered 2018-04-26: 2 mg via INTRAVENOUS
  Filled 2018-04-26: qty 2

## 2018-04-26 MED ORDER — ROCURONIUM BROMIDE 100 MG/10ML IV SOLN
INTRAVENOUS | Status: DC | PRN
  Administered 2018-04-26: 50 mg via INTRAVENOUS

## 2018-04-26 MED ORDER — BUPIVACAINE-EPINEPHRINE (PF) 0.25% -1:200000 IJ SOLN
INTRAMUSCULAR | Status: AC
  Filled 2018-04-26: qty 30

## 2018-04-26 MED ORDER — ROPIVACAINE HCL 5 MG/ML IJ SOLN
INTRAMUSCULAR | Status: DC | PRN
  Administered 2018-04-26: 30 mL via PERINEURAL

## 2018-04-26 MED ORDER — CHLORHEXIDINE GLUCONATE CLOTH 2 % EX PADS: 6.0000 | MEDICATED_PAD | Freq: Once | CUTANEOUS | Status: DC

## 2018-04-26 MED ORDER — FENTANYL CITRATE (PF) 100 MCG/2ML IJ SOLN: 25.0000 ug | INTRAMUSCULAR | Status: DC | PRN

## 2018-04-26 MED ORDER — SUGAMMADEX SODIUM 200 MG/2ML IV SOLN
INTRAVENOUS | Status: DC | PRN
  Administered 2018-04-26: 200 mg via INTRAVENOUS

## 2018-04-26 MED ORDER — DEXAMETHASONE SODIUM PHOSPHATE 10 MG/ML IJ SOLN
INTRAMUSCULAR | Status: DC | PRN
  Administered 2018-04-26: 10 mg via INTRAVENOUS

## 2018-04-26 MED ORDER — HYDROCODONE-ACETAMINOPHEN 5-325 MG PO TABS: 1.0000 | ORAL_TABLET | Freq: Four times a day (QID) | ORAL | 0 refills | Status: DC | PRN

## 2018-04-26 MED ORDER — 0.9 % SODIUM CHLORIDE (POUR BTL) OPTIME
TOPICAL | Status: DC | PRN
  Administered 2018-04-26: 1000 mL

## 2018-04-26 MED ORDER — FENTANYL CITRATE (PF) 100 MCG/2ML IJ SOLN
INTRAMUSCULAR | Status: AC
  Administered 2018-04-26: 50 ug via INTRAVENOUS
  Filled 2018-04-26: qty 2

## 2018-04-26 MED ORDER — CEFAZOLIN SODIUM-DEXTROSE 2-4 GM/100ML-% IV SOLN
2.0000 g | INTRAVENOUS | Status: AC
  Administered 2018-04-26: 2 g via INTRAVENOUS
  Filled 2018-04-26: qty 100

## 2018-04-26 MED ORDER — FENTANYL CITRATE (PF) 100 MCG/2ML IJ SOLN
50.0000 ug | Freq: Once | INTRAMUSCULAR | Status: AC
  Administered 2018-04-26: 50 ug via INTRAVENOUS

## 2018-04-26 MED ORDER — FENTANYL CITRATE (PF) 250 MCG/5ML IJ SOLN
INTRAMUSCULAR | Status: AC
  Filled 2018-04-26: qty 5

## 2018-04-26 MED ORDER — DEXAMETHASONE SODIUM PHOSPHATE 10 MG/ML IJ SOLN
INTRAMUSCULAR | Status: AC
  Filled 2018-04-26: qty 1

## 2018-04-26 MED ORDER — LIDOCAINE 2% (20 MG/ML) 5 ML SYRINGE
INTRAMUSCULAR | Status: DC | PRN
  Administered 2018-04-26: 50 mg via INTRAVENOUS
  Administered 2018-04-26: 100 mg via INTRAVENOUS

## 2018-04-26 MED ORDER — FENTANYL CITRATE (PF) 100 MCG/2ML IJ SOLN
INTRAMUSCULAR | Status: DC | PRN
  Administered 2018-04-26 (×3): 50 ug via INTRAVENOUS

## 2018-04-26 MED ORDER — MIDAZOLAM HCL 2 MG/2ML IJ SOLN
2.0000 mg | Freq: Once | INTRAMUSCULAR | Status: AC
  Administered 2018-04-26: 2 mg via INTRAVENOUS

## 2018-04-26 MED ORDER — BUPIVACAINE-EPINEPHRINE 0.25% -1:200000 IJ SOLN
INTRAMUSCULAR | Status: DC | PRN
  Administered 2018-04-26: 30 mL

## 2018-04-26 MED ORDER — CELECOXIB 200 MG PO CAPS
200.0000 mg | ORAL_CAPSULE | ORAL | Status: AC
  Administered 2018-04-26: 200 mg via ORAL
  Filled 2018-04-26: qty 1

## 2018-04-26 MED ORDER — LACTATED RINGERS IV SOLN
INTRAVENOUS | Status: DC | PRN
  Administered 2018-04-26: 11:00:00 via INTRAVENOUS

## 2018-04-26 MED ORDER — PROPOFOL 10 MG/ML IV BOLUS
INTRAVENOUS | Status: DC | PRN
  Administered 2018-04-26: 100 mg via INTRAVENOUS

## 2018-04-26 MED ORDER — ONDANSETRON HCL 4 MG/2ML IJ SOLN
INTRAMUSCULAR | Status: DC | PRN
  Administered 2018-04-26: 4 mg via INTRAVENOUS

## 2018-04-26 MED ORDER — CLONIDINE HCL (ANALGESIA) 100 MCG/ML EP SOLN
EPIDURAL | Status: DC | PRN
  Administered 2018-04-26: 50 ug

## 2018-04-26 MED ORDER — ACETAMINOPHEN 500 MG PO TABS
1000.0000 mg | ORAL_TABLET | ORAL | Status: AC
  Administered 2018-04-26: 1000 mg via ORAL
  Filled 2018-04-26: qty 2

## 2018-04-26 MED ORDER — GABAPENTIN 300 MG PO CAPS
300.0000 mg | ORAL_CAPSULE | ORAL | Status: AC
  Administered 2018-04-26: 300 mg via ORAL
  Filled 2018-04-26: qty 1

## 2018-04-26 SURGICAL SUPPLY — 37 items
BLADE CLIPPER SURG (BLADE) IMPLANT
CHLORAPREP W/TINT 26ML (MISCELLANEOUS) ×2 IMPLANT
COVER SURGICAL LIGHT HANDLE (MISCELLANEOUS) ×2 IMPLANT
COVER WAND RF STERILE (DRAPES) ×2 IMPLANT
DECANTER SPIKE VIAL GLASS SM (MISCELLANEOUS) ×2 IMPLANT
DERMABOND ADVANCED (GAUZE/BANDAGES/DRESSINGS) ×1
DERMABOND ADVANCED .7 DNX12 (GAUZE/BANDAGES/DRESSINGS) ×1 IMPLANT
DRAIN PENROSE 1/2X12 LTX STRL (WOUND CARE) IMPLANT
DRAPE LAPAROSCOPIC ABDOMINAL (DRAPES) ×2 IMPLANT
ELECT REM PT RETURN 9FT ADLT (ELECTROSURGICAL) ×2
ELECTRODE REM PT RTRN 9FT ADLT (ELECTROSURGICAL) ×1 IMPLANT
GLOVE BIO SURGEON STRL SZ7.5 (GLOVE) ×2 IMPLANT
GOWN STRL REUS W/ TWL LRG LVL3 (GOWN DISPOSABLE) ×2 IMPLANT
GOWN STRL REUS W/TWL LRG LVL3 (GOWN DISPOSABLE) ×2
KIT BASIN OR (CUSTOM PROCEDURE TRAY) ×2 IMPLANT
KIT TURNOVER KIT B (KITS) ×2 IMPLANT
MESH ULTRAPRO 3X6 7.6X15CM (Mesh General) ×1 IMPLANT
NDL HYPO 25GX1X1/2 BEV (NEEDLE) ×1 IMPLANT
NEEDLE HYPO 25GX1X1/2 BEV (NEEDLE) ×2 IMPLANT
NS IRRIG 1000ML POUR BTL (IV SOLUTION) ×2 IMPLANT
PACK GENERAL/GYN (CUSTOM PROCEDURE TRAY) ×2 IMPLANT
PAD ARMBOARD 7.5X6 YLW CONV (MISCELLANEOUS) ×4 IMPLANT
PENCIL SMOKE EVACUATOR (MISCELLANEOUS) ×2 IMPLANT
SUT MNCRL AB 4-0 PS2 18 (SUTURE) ×2 IMPLANT
SUT PROLENE 2 0 SH DA (SUTURE) ×4 IMPLANT
SUT SILK 2 0 SH (SUTURE) IMPLANT
SUT SILK 3 0 (SUTURE) ×1
SUT SILK 3-0 18XBRD TIE 12 (SUTURE) ×1 IMPLANT
SUT VIC AB 0 CT1 27 (SUTURE) ×1
SUT VIC AB 0 CT1 27XBRD ANBCTR (SUTURE) ×1 IMPLANT
SUT VIC AB 2-0 SH 27 (SUTURE) ×1
SUT VIC AB 2-0 SH 27X BRD (SUTURE) ×1 IMPLANT
SUT VIC AB 3-0 SH 27 (SUTURE) ×1
SUT VIC AB 3-0 SH 27XBRD (SUTURE) ×1 IMPLANT
SYR CONTROL 10ML LL (SYRINGE) ×2 IMPLANT
TOWEL OR 17X24 6PK STRL BLUE (TOWEL DISPOSABLE) ×2 IMPLANT
TOWEL OR 17X26 10 PK STRL BLUE (TOWEL DISPOSABLE) ×2 IMPLANT

## 2018-04-26 NOTE — Transfer of Care (Signed)
Immediate Anesthesia Transfer of Care Note  Patient: Joel Lopez  Procedure(s) Performed: LEFT INGUINAL HERNIA REPAIR WITH MESH (Left ) INSERTION OF MESH (Left )  Patient Location: PACU  Anesthesia Type:General and Regional  Level of Consciousness: awake, alert  and oriented  Airway & Oxygen Therapy: Patient Spontanous Breathing and Patient connected to face mask oxygen  Post-op Assessment: Report given to RN and Post -op Vital signs reviewed and stable  Post vital signs: Reviewed and stable  Last Vitals:  Vitals Value Taken Time  BP    Temp    Pulse 85 04/26/2018 12:59 PM  Resp 17 04/26/2018 12:59 PM  SpO2 100 % 04/26/2018 12:59 PM  Vitals shown include unvalidated device data.  Last Pain:  Vitals:   04/26/18 1020  TempSrc:   PainSc: 0-No pain      Patients Stated Pain Goal: 3 (37/29/02 1115)  Complications: No apparent anesthesia complications

## 2018-04-26 NOTE — Anesthesia Procedure Notes (Signed)
Anesthesia Regional Block: Quadratus lumborum   Pre-Anesthetic Checklist: ,, timeout performed, Correct Patient, Correct Site, Correct Laterality, Correct Procedure, Correct Position, site marked, Risks and benefits discussed,  Surgical consent,  Pre-op evaluation,  At surgeon's request and post-op pain management  Laterality: Left  Prep: Maximum Sterile Barrier Precautions used, chloraprep       Needles:  Injection technique: Single-shot  Needle Type: Echogenic Stimulator Needle     Needle Length: 9cm  Needle Gauge: 22     Additional Needles:   Procedures:,,,, ultrasound used (permanent image in chart),,,,  Narrative:  Start time: 04/26/2018 10:05 AM End time: 04/26/2018 10:15 AM Injection made incrementally with aspirations every 5 mL.  Performed by: Personally  Anesthesiologist: Freddrick March, MD  Additional Notes: Monitors applied. No increased pain on injection. No increased resistance to injection. Injection made in 5cc increments. Good needle visualization. Patient tolerated procedure well.

## 2018-04-26 NOTE — Anesthesia Preprocedure Evaluation (Addendum)
Anesthesia Evaluation  Patient identified by MRN, date of birth, ID band Patient awake    Reviewed: Allergy & Precautions, NPO status , Patient's Chart, lab work & pertinent test results  Airway Mallampati: II  TM Distance: >3 FB Neck ROM: Full    Dental no notable dental hx. (+) Missing, Dental Advisory Given,    Pulmonary sleep apnea (does not use CPAP) ,    Pulmonary exam normal breath sounds clear to auscultation       Cardiovascular negative cardio ROS Normal cardiovascular exam Rhythm:Regular Rate:Normal     Neuro/Psych negative neurological ROS  negative psych ROS   GI/Hepatic Neg liver ROS, GERD  ,  Endo/Other  negative endocrine ROS  Renal/GU negative Renal ROS  negative genitourinary   Musculoskeletal negative musculoskeletal ROS (+)   Abdominal   Peds  Hematology negative hematology ROS (+)   Anesthesia Other Findings Left inguinal hernia  Reproductive/Obstetrics                           Anesthesia Physical Anesthesia Plan  ASA: II  Anesthesia Plan: General and Regional   Post-op Pain Management:  Regional for Post-op pain   Induction: Intravenous  PONV Risk Score and Plan: 2 and Ondansetron, Dexamethasone and Midazolam  Airway Management Planned: LMA and Oral ETT  Additional Equipment:   Intra-op Plan:   Post-operative Plan: Extubation in OR  Informed Consent: I have reviewed the patients History and Physical, chart, labs and discussed the procedure including the risks, benefits and alternatives for the proposed anesthesia with the patient or authorized representative who has indicated his/her understanding and acceptance.   Dental advisory given  Plan Discussed with: CRNA  Anesthesia Plan Comments:         Anesthesia Quick Evaluation

## 2018-04-26 NOTE — Anesthesia Procedure Notes (Signed)
Procedure Name: Intubation Date/Time: 04/26/2018 11:02 AM Performed by: Gwyndolyn Saxon, CRNA Pre-anesthesia Checklist: Patient identified, Emergency Drugs available, Suction available and Patient being monitored Patient Re-evaluated:Patient Re-evaluated prior to induction Oxygen Delivery Method: Circle system utilized Preoxygenation: Pre-oxygenation with 100% oxygen Induction Type: IV induction Ventilation: Mask ventilation without difficulty Laryngoscope Size: Miller and 2 Grade View: Grade I Tube type: Oral Tube size: 7.5 mm Number of attempts: 1 Airway Equipment and Method: Patient positioned with wedge pillow and Stylet Placement Confirmation: ETT inserted through vocal cords under direct vision,  positive ETCO2 and breath sounds checked- equal and bilateral Secured at: 23 cm Tube secured with: Tape Dental Injury: Teeth and Oropharynx as per pre-operative assessment

## 2018-04-26 NOTE — Op Note (Signed)
04/26/2018  12:19 PM  PATIENT:  Joel Lopez  68 y.o. male  PRE-OPERATIVE DIAGNOSIS:  LEFT INGUINAL HERNIA  POST-OPERATIVE DIAGNOSIS:  LEFT INGUINAL HERNIA  PROCEDURE:  Procedure(s): LEFT INGUINAL HERNIA REPAIR WITH MESH (Left) INSERTION OF MESH (Left)  SURGEON:  Surgeon(s) and Role:    * Jovita Kussmaul, MD - Primary  PHYSICIAN ASSISTANT:   ASSISTANTS: none   ANESTHESIA:   local and general  EBL:  5 mL   BLOOD ADMINISTERED:none  DRAINS: none   LOCAL MEDICATIONS USED:  MARCAINE     SPECIMEN:  No Specimen  DISPOSITION OF SPECIMEN:  N/A  COUNTS:  YES  TOURNIQUET:  * No tourniquets in log *  DICTATION: .Dragon Dictation   After informed consent was obtained the patient was brought to the operating room and placed in the supine position on the operating table.  After adequate induction of general anesthesia the patient's abdomen and left groin were prepped with ChloraPrep, allowed to dry, and draped in usual sterile manner.  An appropriate timeout was performed.  The left groin was then infiltrated with quarter percent Marcaine.  A small incision was made from the edge of the pubic tubercle towards the anterior superior iliac spine with a 15 blade knife.  The incision was carried through the skin and subcutaneous tissue sharply with electrocautery until the fascia of the external oblique was encountered.  A small bridging vein was clamped with hemostats, divided, and ligated with 3-0 silk ties.  The external oblique fascia was opened along its fibers towards the apex of the external ring with a 15 blade knife and Metzenbaum scissors.  A [place] retractor was deployed.  Blunt dissection was carried out of the cord structures until they could be surrounded between 2 fingers.  Half inch Penrose drain was placed around the cord structures for retraction purposes.  The cord was then gently skeletonized by blunt hemostat dissection.  I was able to identify a broad-based sac  associated with the cord.  This was readily reduced back beneath the transversalis layer and the floor of the canal was repaired with interrupted 0 Vicryl stitches.  A 3 x 6 piece of ultra Pro mesh was then chosen and cut to the appropriate size.  The mesh was sewed inferiorly to the shelving edge of the inguinal ligament with a running 2-0 Prolene stitch.  Tails were cut in the mesh laterally and the tails were wrapped around the cord structures.  Superiorly the mesh was sewed to the musculoaponeurotic strength value of the transversalis with interrupted 2-0 Prolene vertical mattress stitches.  Lateral to the cord the tails of the mesh were anchored to the shelving edge of the inguinal ligament with an interrupted 2-0 Prolene stitch.  Once this was accomplished the mesh was in good position the hernia seem to be well repaired.  The wound was irrigated with copious amounts of saline.  During the dissection the ileo-inguinal nerve was identified and involved with scar tissue.  It was dissected out proximally distally, clamped, divided, and ligated with 3-0 silk ties.  The external oblique fascia was then closed with a running 2-0 Vicryl stitch.  Care was taken during the dissection to identify the vas deferens and testicular artery and spared the structures.  The wound was then infiltrated with quarter percent Marcaine.  The subcutaneous fascia was then closed with a running 3-0 Vicryl stitch.  The skin was then closed with a running 4-0 Monocryl subcuticular stitch.  Dermabond dressings were applied.  The patient tolerated the procedure well.  At the end of the case all needle sponge and instrument counts were correct.  The patient was then awakened and taken to recovery in stable condition.  The patient's testicles in the scrotum at the end of the case.  PLAN OF CARE: Discharge to home after PACU  PATIENT DISPOSITION:  PACU - hemodynamically stable.   Delay start of Pharmacological VTE agent (>24hrs) due to  surgical blood loss or risk of bleeding: not applicable

## 2018-04-26 NOTE — Interval H&P Note (Signed)
History and Physical Interval Note:  04/26/2018 10:42 AM  Joel Lopez  has presented today for surgery, with the diagnosis of LEFT INGUINAL HERNIA  The various methods of treatment have been discussed with the patient and family. After consideration of risks, benefits and other options for treatment, the patient has consented to  Procedure(s): LEFT INGUINAL HERNIA REPAIR WITH MESH (Left) INSERTION OF MESH (Left) as a surgical intervention .  The patient's history has been reviewed, patient examined, no change in status, stable for surgery.  I have reviewed the patient's chart and labs.  Questions were answered to the patient's satisfaction.     Autumn Messing III

## 2018-04-26 NOTE — H&P (Signed)
Joel Lopez  Location: [facility] Surgery Patient #: [id number] DOB: 02/25/1951 Divorced / Language: English / Race: Black or African American Male   History of Present Illness  The patient is a 68 year old male who presents for a follow-up for Abdominal swelling. The patient is a 68 year old white male who we saw about 1-1/2 years ago with a left inguinal hernia. He was reluctant to schedule surgery at that time. Since that visit and his hernia has gotten a little bit larger. He denies any acute pain associated with it. He denies any nausea or vomiting. He denies any fevers or chills. He feels ready to have it fixed at this point.   Allergies  No Known Drug Allergies    Medication History No Current Medications Medications Reconciled    Review of Systems General Not Present- Appetite Loss, Chills, Fatigue, Fever, Night Sweats, Weight Gain and Weight Loss. Skin Not Present- Change in Wart/Mole, Dryness, Hives, Jaundice, New Lesions, Non-Healing Wounds, Rash and Ulcer. HEENT Present- Wears glasses/contact lenses. Not Present- Earache, Hearing Loss, Hoarseness, Nose Bleed, Oral Ulcers, Ringing in the Ears, Seasonal Allergies, Sinus Pain, Sore Throat, Visual Disturbances and Yellow Eyes. Respiratory Not Present- Bloody sputum, Chronic Cough, Difficulty Breathing, Snoring and Wheezing. Breast Not Present- Breast Mass, Breast Pain, Nipple Discharge and Skin Changes. Cardiovascular Not Present- Chest Pain, Difficulty Breathing Lying Down, Leg Cramps, Palpitations, Rapid Heart Rate, Shortness of Breath and Swelling of Extremities. Gastrointestinal Present- Hemorrhoids. Not Present- Abdominal Pain, Bloating, Bloody Stool, Change in Bowel Habits, Chronic diarrhea, Constipation, Difficulty Swallowing, Excessive gas, Gets full quickly at meals, Indigestion, Nausea, Rectal Pain and Vomiting. Male Genitourinary Not Present- Blood in Urine, Change in Urinary Stream, Frequency,  Impotence, Nocturia, Painful Urination, Urgency and Urine Leakage. Musculoskeletal Not Present- Back Pain, Joint Pain, Joint Stiffness, Muscle Pain, Muscle Weakness and Swelling of Extremities. Neurological Not Present- Decreased Memory, Fainting, Headaches, Numbness, Seizures, Tingling, Tremor, Trouble walking and Weakness. Psychiatric Not Present- Anxiety, Bipolar, Change in Sleep Pattern, Depression, Fearful and Frequent crying. Endocrine Not Present- Cold Intolerance, Excessive Hunger, Hair Changes, Heat Intolerance and New Diabetes. Hematology Not Present- Easy Bruising, Excessive bleeding, Gland problems, HIV and Persistent Infections.  Vitals  Weight: 194.25 lb Height: 71in Body Surface Area: 2.08 m Body Mass Index: 27.09 kg/m  Temp.: 19F  Pulse: 77 (Regular)  P.OX: 97% (Room air) BP: 162/90 (Sitting, Left Arm, Standard)       Physical Exam  General Mental Status-Alert. General Appearance-Consistent with stated age. Hydration-Well hydrated. Voice-Normal.  Head and Neck Head-normocephalic, atraumatic with no lesions or palpable masses. Trachea-midline. Thyroid Gland Characteristics - normal size and consistency.  Eye Eyeball - Bilateral-Extraocular movements intact. Sclera/Conjunctiva - Bilateral-No scleral icterus.  Chest and Lung Exam Chest and lung exam reveals -quiet, even and easy respiratory effort with no use of accessory muscles and on auscultation, normal breath sounds, no adventitious sounds and normal vocal resonance. Inspection Chest Wall - Normal. Back - normal.  Cardiovascular Cardiovascular examination reveals -normal heart sounds, regular rate and rhythm with no murmurs and normal pedal pulses bilaterally.  Abdomen Inspection Inspection of the abdomen reveals - No Hernias. Skin - Scar - no surgical scars. Palpation/Percussion Palpation and Percussion of the abdomen reveal - Soft, Non Tender, No Rebound tenderness,  No Rigidity (guarding) and No hepatosplenomegaly. Auscultation Auscultation of the abdomen reveals - Bowel sounds normal.  Male Genitourinary Note: There is a moderate sized reducible bulge in the left inguinal region. There is no palpable bulge or impulse was  straining in the right groin.   Neurologic Neurologic evaluation reveals -alert and oriented x 3 with no impairment of recent or remote memory. Mental Status-Normal.  Musculoskeletal Normal Exam - Left-Upper Extremity Strength Normal and Lower Extremity Strength Normal. Normal Exam - Right-Upper Extremity Strength Normal and Lower Extremity Strength Normal.  Lymphatic Head & Neck  General Head & Neck Lymphatics: Bilateral - Description - Normal. Axillary  General Axillary Region: Bilateral - Description - Normal. Tenderness - Non Tender. Femoral & Inguinal  Generalized Femoral & Inguinal Lymphatics: Bilateral - Description - Normal. Tenderness - Non Tender.    Assessment & Plan  INGUINAL HERNIA OF LEFT SIDE WITHOUT OBSTRUCTION OR GANGRENE (K40.90) Impression: The patient has a known left inguinal hernia that he has been considering having fixed. At this point he is ready to schedule surgery. I have discussed with him in detail the risks and benefits of the operation as well as some of the technical aspects including the use of mesh and he understands and wishes to proceed. I will plan for a left inguinal hernia repair with mesh

## 2018-04-28 NOTE — Anesthesia Postprocedure Evaluation (Signed)
Anesthesia Post Note  Patient: Joel Lopez  Procedure(s) Performed: LEFT INGUINAL HERNIA REPAIR WITH MESH (Left ) INSERTION OF MESH (Left )     Patient location during evaluation: PACU Anesthesia Type: Regional and General Level of consciousness: awake and alert Pain management: pain level controlled Vital Signs Assessment: post-procedure vital signs reviewed and stable Respiratory status: spontaneous breathing, nonlabored ventilation, respiratory function stable and patient connected to nasal cannula oxygen Cardiovascular status: blood pressure returned to baseline and stable Postop Assessment: no apparent nausea or vomiting Anesthetic complications: no    Last Vitals:  Vitals:   04/26/18 1340 04/26/18 1356  BP: 131/83 135/84  Pulse: 71 60  Resp: 17 16  Temp: 36.7 C   SpO2: 97% 99%    Last Pain:  Vitals:   04/26/18 1356  TempSrc:   PainSc: 1                  Chelsey L Woodrum

## 2018-04-29 ENCOUNTER — Encounter (HOSPITAL_COMMUNITY): Payer: Self-pay | Admitting: General Surgery

## 2018-09-18 ENCOUNTER — Other Ambulatory Visit: Payer: Self-pay

## 2018-09-19 ENCOUNTER — Ambulatory Visit (INDEPENDENT_AMBULATORY_CARE_PROVIDER_SITE_OTHER): Payer: 59 | Admitting: Sports Medicine

## 2018-09-19 ENCOUNTER — Encounter: Payer: Self-pay | Admitting: Sports Medicine

## 2018-09-19 ENCOUNTER — Ambulatory Visit: Payer: Self-pay

## 2018-09-19 VITALS — BP 120/70 | Ht 71.0 in | Wt 195.0 lb

## 2018-09-19 DIAGNOSIS — M25562 Pain in left knee: Secondary | ICD-10-CM | POA: Insufficient documentation

## 2018-09-19 NOTE — Assessment & Plan Note (Addendum)
It appears that his calcific patellar tendinitis has resolved, but since his pain has persisted, the most likely culprit is the sharp bone spur visualized along medial rorchlear groove on the ultrasound.  This bone spur is likely irritating the quadriceps tendon and causing the swelling and pain that patient describes.  Patient could also have an element of venous insufficiency since he has generalized swelling of both of his legs.  He was counseled to wear a body Helix knee brace to help decrease shearing of the quadriceps tendon by the patellar bone spur.  Compression by the knee brace may also help decrease swelling.  He was advised that he can continue to walk and slowly restart tennis if he can tolerate these activities without pain.  We would like for him to contact us in about 1 month to update [place] on his progress.

## 2018-09-19 NOTE — Progress Notes (Signed)
Subjective:    Joel Lopez - 68 y.o. male MRN [id number]  Date of birth: 03/30/1951  CC:  Joel Lopez is here for follow-up of left knee pain.  HPI: [name] presents for follow-up of his left knee pain and swelling.  His last visit was in October 2019, where he was diagnosed with calcific patellar tendinitis.  He was given a prescription for nitroglycerin patches and referred for physical therapy, but he says neither of these interventions led to significant improvement.  While he first noticed pain and swelling after playing tennis, he now notes that he will have pain and swelling in the left knee extending from his mid thigh to his foot after walking 3.5 miles.  He has tried to play tennis for several months.  He describes the pain as throbbing and deep in his knee.  He uses ice and rest to relieve the pain.  Health Maintenance:  Health Maintenance Due  Topic Date Due  . PNA vac Low Risk Adult (1 of 2 - PCV13) 09/08/2017    -  reports that he has never smoked. He has never used smokeless tobacco. - Review of Systems: Per HPI. - Past Medical History: Patient Active Problem List   Diagnosis Date Noted  . Acute pain of left knee 09/19/2018  . Right hand pain 02/17/2018  . Inguinal hernia 05/27/2017  . Systolic murmur 09/73/5329  . Advance care planning 01/15/2015  . Fatigue 01/15/2015  . Incomplete tear of rotator cuff 08/17/2011  . Encounter for general adult medical examination with abnormal findings 01/11/2011  . ARTHRITIS, LEFT KNEE 10/09/2007  . Anemia of other chronic disease 09/18/2007  . GERD 09/18/2007  . HEMORRHOIDS, NOS 04/22/2007  . OBSTRUCTIVE SLEEP APNEA 04/17/2007   - Medications: reviewed and updated   ROS No locking No giving way No redness Note he did have a gout attack of great toe at one time in past   Objective:   Physical Exam BP 120/70   Ht 5\' 11"  (1.803 m)   Wt 195 lb (88.5 kg)   BMI 27.20 kg/m  Gen: NAD, alert, cooperative  with exam, well-appearing Musculoskeletal: Left knee: No deformity or effusion visualized.  Nontender to palpation.  FROM with 5/5 strength.  Lachman, anterior and posterior drawer, varus and valgus stress, Thessaly, and McMurray testing are negative.  NVI distally. Right knee: No deformity. FROM with 5/5 strength. No tenderness to palpation. NVI distally.  ULTRASOUND: Knee, left Diagnostic complete ultrasound imaging obtained of patient's left knee.  - Quadriceps tendon: No appreciated signs of tearing, edema, or calcification. No fluid/edema noted within the suprapatellar pouch.  - Patellar tendon: No appreciated signs of tearing, edema, or calcification, which is changed from his ultrasound in 10/19. No infrapatellar or tibial tuberosity fluid or abnormality appreciated.  - Medial joint line: No signs concerning for meniscal pathology appreciated. No increased fluid presence noted. No evidence of osteophyte development or significant joint space loss.  - Lateral joint line: No signs concerning for meniscal pathology appreciated. No increased fluid presence noted. No evidence of osteophyte development or significant joint space loss.  - Popliteal fossa: No evidence of Baker's cyst formation. Vasculature unremarkable. - MCL: No evidence of integrity loss or abnormal fluid presence.  - LCL: No evidence of integrity loss or abnormal fluid presence.  - Pes Anserine: No evidence of integrity loss or abnormal fluid presence.   On trochlear view there is spurring along the medial border of trochlea that impinges on Quadriceps  Tendon;  Most PF joint space preserved.  Impression: Spurring in PF joint medially  Ultrasound and interpretation by Wolfgang Phoenix. Esme Freund, MD     Assessment & Plan:   Acute pain of left knee It appears that his calcific patellar tendinitis has resolved, but since his pain has persisted, the most likely culprit is the patellar bone spur visualized on the ultrasound.  This  bone spur is likely irritating the quadriceps tendon and causing the swelling and pain that patient describes.  Patient could also have an element of venous insufficiency since he has generalized swelling of both of his legs.  He was counseled to wear a body Helix knee brace to help decrease shearing of the quadriceps tendon by the patellar bone spur.  Compression by the knee brace may also help decrease swelling.  He was advised that he can continue to walk and slowly restart tennis if he can tolerate these activities without pain.  We would like for him to contact us in about 1 month to update [place] on his progress.    Maia Breslow, M.D. 09/19/2018, 4:58 PM PGY-2, [place]

## 2018-11-08 ENCOUNTER — Other Ambulatory Visit (INDEPENDENT_AMBULATORY_CARE_PROVIDER_SITE_OTHER): Payer: 59

## 2018-11-08 ENCOUNTER — Other Ambulatory Visit: Payer: Self-pay | Admitting: Family Medicine

## 2018-11-08 ENCOUNTER — Other Ambulatory Visit: Payer: Self-pay

## 2018-11-08 DIAGNOSIS — Z8639 Personal history of other endocrine, nutritional and metabolic disease: Secondary | ICD-10-CM

## 2018-11-08 DIAGNOSIS — Z125 Encounter for screening for malignant neoplasm of prostate: Secondary | ICD-10-CM | POA: Diagnosis not present

## 2018-11-08 DIAGNOSIS — R7989 Other specified abnormal findings of blood chemistry: Secondary | ICD-10-CM

## 2018-11-08 LAB — CBC WITH DIFFERENTIAL/PLATELET
Basophils Absolute: 0.1 10*3/uL (ref 0.0–0.1)
Basophils Relative: 1.4 % (ref 0.0–3.0)
Eosinophils Absolute: 0.3 10*3/uL (ref 0.0–0.7)
Eosinophils Relative: 7.2 % — ABNORMAL HIGH (ref 0.0–5.0)
HCT: 39.8 % (ref 39.0–52.0)
Hemoglobin: 12.9 g/dL — ABNORMAL LOW (ref 13.0–17.0)
Lymphocytes Relative: 35 % (ref 12.0–46.0)
Lymphs Abs: 1.3 10*3/uL (ref 0.7–4.0)
MCHC: 32.4 g/dL (ref 30.0–36.0)
MCV: 85.5 fl (ref 78.0–100.0)
Monocytes Absolute: 0.4 10*3/uL (ref 0.1–1.0)
Monocytes Relative: 12.4 % — ABNORMAL HIGH (ref 3.0–12.0)
Neutro Abs: 1.6 10*3/uL (ref 1.4–7.7)
Neutrophils Relative %: 44 % (ref 43.0–77.0)
Platelets: 250 10*3/uL (ref 150.0–400.0)
RBC: 4.65 Mil/uL (ref 4.22–5.81)
RDW: 14.1 % (ref 11.5–15.5)
WBC: 3.6 10*3/uL — ABNORMAL LOW (ref 4.0–10.5)

## 2018-11-08 LAB — PSA, MEDICARE: PSA: 0.71 ng/ml (ref 0.10–4.00)

## 2018-11-08 LAB — LIPID PANEL
Cholesterol: 165 mg/dL (ref 0–200)
HDL: 40.9 mg/dL (ref >39.00)
LDL Cholesterol: 112 mg/dL — ABNORMAL HIGH (ref 0–99)
NonHDL: 124.01
Total CHOL/HDL Ratio: 4
Triglycerides: 61 mg/dL (ref 0.0–149.0)
VLDL: 12.2 mg/dL (ref 0.0–40.0)

## 2018-11-08 LAB — COMPREHENSIVE METABOLIC PANEL
ALT: 18 U/L (ref 0–53)
AST: 17 U/L (ref 0–37)
Albumin: 4.1 g/dL (ref 3.5–5.2)
Alkaline Phosphatase: 77 U/L (ref 39–117)
BUN: 18 mg/dL (ref 6–23)
CO2: 30 mEq/L (ref 19–32)
Calcium: 9.8 mg/dL (ref 8.4–10.5)
Chloride: 105 mEq/L (ref 96–112)
Creatinine, Ser: 0.97 mg/dL (ref 0.40–1.50)
GFR: 93.65 mL/min (ref >60.00)
Glucose, Bld: 79 mg/dL (ref 70–99)
Potassium: 4.4 mEq/L (ref 3.5–5.1)
Sodium: 140 mEq/L (ref 135–145)
Total Bilirubin: 0.9 mg/dL (ref 0.2–1.2)
Total Protein: 6.5 g/dL (ref 6.0–8.3)

## 2018-11-12 ENCOUNTER — Other Ambulatory Visit: Payer: Self-pay

## 2018-11-12 ENCOUNTER — Encounter: Payer: Self-pay | Admitting: Family Medicine

## 2018-11-12 ENCOUNTER — Ambulatory Visit (INDEPENDENT_AMBULATORY_CARE_PROVIDER_SITE_OTHER): Payer: 59 | Admitting: Family Medicine

## 2018-11-12 VITALS — BP 122/80 | HR 65 | Temp 98.2°F | Ht 71.0 in | Wt 191.5 lb

## 2018-11-12 DIAGNOSIS — Z0000 Encounter for general adult medical examination without abnormal findings: Secondary | ICD-10-CM

## 2018-11-12 DIAGNOSIS — Z23 Encounter for immunization: Secondary | ICD-10-CM

## 2018-11-12 DIAGNOSIS — Z7189 Other specified counseling: Secondary | ICD-10-CM

## 2018-11-12 DIAGNOSIS — M79641 Pain in right hand: Secondary | ICD-10-CM

## 2018-11-12 NOTE — Progress Notes (Signed)
CPE- See plan.  Routine anticipatory guidance given to patient.  See health maintenance.  The possibility exists that previously documented standard health maintenance information may have been brought forward from a previous encounter into this note.  If needed, that same information has been updated to reflect the current situation based on today's encounter.    Tetanus 2020 Flu d/w pt.   do yearly. PNA 2020.  Shingles d/w pt.  See AVS.  PSA wnl, d/w pt about screening.   Colonoscopy 2019.  F/u 2024 Living will d/w pt. Would have [name] designated if patient were incapacitated.   Diet and exercise d/w pt.  Working on both, "I'm doing pretty good."  HIV and HCV prev neg, d/w pt.  Recent labs discussed with patient.  No intervention needed.  No concerns about memory.  Normal pretesting today He is not falling. Able to complete all ADLs.  He is retiring Friday from [employer], after 48 years.  He is happy with the change.    He had seen Dr. Oneida Alar about his knee pain.  He is jogging and doing better.  I will defer.  Patient agrees.  PMH and SH reviewed  Meds, vitals, and allergies reviewed.   ROS: Per HPI.  Unless specifically indicated otherwise in HPI, the patient denies:  General: fever. Eyes: acute vision changes ENT: sore throat Cardiovascular: chest pain Respiratory: SOB GI: vomiting GU: dysuria Musculoskeletal: acute back pain Derm: acute rash Neuro: acute motor dysfunction Psych: worsening mood Endocrine: polydipsia Heme: bleeding Allergy: hayfever  GEN: nad, alert and oriented HEENT: ncat NECK: supple w/o LA CV: rrr. PULM: ctab, no inc wob ABD: soft, +bs EXT: no edema SKIN: no acute rash

## 2018-11-12 NOTE — Patient Instructions (Addendum)
I would get a flu shot each fall.   Check with your insurance to see if they will cover the shingrix shot. Thanks for your effort.   Take care.  Glad to see you.

## 2018-11-14 ENCOUNTER — Telehealth: Payer: Self-pay | Admitting: Family Medicine

## 2018-11-14 NOTE — Assessment & Plan Note (Signed)
Tetanus 2020 Flu d/w pt.   do yearly. PNA 2020.  Shingles d/w pt.  See AVS.  PSA wnl, d/w pt about screening.   Colonoscopy 2019.  F/u 2024 Living will d/w pt. Would have [name] designated if patient were incapacitated.   Diet and exercise d/w pt.  Working on both, "I'm doing pretty good."  HIV and HCV prev neg, d/w pt.

## 2018-11-14 NOTE — Telephone Encounter (Signed)
Patient returned call. He stated he is going to call his insurance and verify with them that it will be covered. He will call back to schedule after he speaks to them

## 2018-11-14 NOTE — Assessment & Plan Note (Signed)
Tetanus 2020 Flu d/w pt.   do yearly. PNA 2020.  Shingles d/w pt.  See AVS.  PSA wnl, d/w pt about screening.   Colonoscopy 2019.  F/u 2024 Living will d/w pt. Would have his sister Joel Lopez designated if patient were incapacitated.    Diet and exercise d/w pt.  Working on both, "I'm doing pretty good."  HIV and HCV prev neg, d/w pt.

## 2018-11-14 NOTE — Assessment & Plan Note (Signed)
Living will d/w pt. Would have his sister Jacqueline Bell designated if patient were incapacitated.    °

## 2018-11-14 NOTE — Telephone Encounter (Signed)
Called patient to schedule shingrix vaccination. Patient needs to be aware that he needs to check with insurance to verify they will cover. Left voicemail asking him to call office.

## 2018-12-27 ENCOUNTER — Telehealth: Payer: Self-pay | Admitting: Family Medicine

## 2018-12-27 NOTE — Telephone Encounter (Signed)
Noted. Thanks.  I'll await the notes from UC.   

## 2018-12-27 NOTE — Telephone Encounter (Signed)
Patient called to schedule appointment with Dr.Duncan.  Patient said his ankles have been swelling since Monday.  Patient said he wakes up with a headache. Patient doesn't have a way to check his blood pressure.  I spoke to [facility] and told her patient's symptoms. Mearl Latin said with a headache patient would need a virtual visit, but since he can't check his blood pressure and we don't have any openings for today patient would need to go to Urgent Care.  I gave patient the number to [facility].

## 2019-01-21 ENCOUNTER — Ambulatory Visit: Payer: 59

## 2019-01-23 ENCOUNTER — Ambulatory Visit (INDEPENDENT_AMBULATORY_CARE_PROVIDER_SITE_OTHER): Payer: 59

## 2019-01-23 DIAGNOSIS — Z23 Encounter for immunization: Secondary | ICD-10-CM | POA: Diagnosis not present

## 2019-04-18 DIAGNOSIS — Z86718 Personal history of other venous thrombosis and embolism: Secondary | ICD-10-CM

## 2019-04-18 HISTORY — DX: Personal history of other venous thrombosis and embolism: Z86.718

## 2019-05-30 ENCOUNTER — Ambulatory Visit: Payer: Medicare HMO | Attending: Internal Medicine

## 2019-05-30 DIAGNOSIS — Z23 Encounter for immunization: Secondary | ICD-10-CM

## 2019-05-30 NOTE — Progress Notes (Signed)
   Covid-19 Vaccination Clinic  Name:  Joel Lopez    MRN: [id number] DOB: [birth year]  05/30/2019  [name] was observed post Covid-19 immunization for 15 minutes without incidence. He was provided with Vaccine Information Sheet and instruction to access the V-Safe system.   [name] was instructed to call 911 with any severe reactions post vaccine: Marland Kitchen Difficulty breathing  . Swelling of your face and throat  . A fast heartbeat  . A bad rash all over your body  . Dizziness and weakness    Immunizations Administered    Name Date Dose VIS Date Route   Pfizer COVID-19 Vaccine 05/30/2019 11:15 AM 0.3 mL 03/28/2019 Intramuscular   Manufacturer: [place]   Lot: [id number]   [place]: [id number]

## 2019-06-23 ENCOUNTER — Ambulatory Visit: Payer: Medicare HMO | Attending: Internal Medicine

## 2019-06-23 DIAGNOSIS — Z23 Encounter for immunization: Secondary | ICD-10-CM

## 2019-06-23 NOTE — Progress Notes (Signed)
   Covid-19 Vaccination Clinic  Name:  Joel Lopez    MRN: [id number] DOB: 05/28/1950  06/23/2019  Joel Lopez was observed post Covid-19 immunization for 15 minutes without incident. He was provided with Vaccine Information Sheet and instruction to access the V-Safe system.   Joel Lopez was instructed to call 911 with any severe reactions post vaccine: Marland Kitchen Difficulty breathing  . Swelling of face and throat  . A fast heartbeat  . A bad rash all over body  . Dizziness and weakness   Immunizations Administered    Name Date Dose VIS Date Route   Pfizer COVID-19 Vaccine 06/23/2019 10:57 AM 0.3 mL 03/28/2019 Intramuscular   Manufacturer: [place]   Lot: [id number]   [place]: [id number]

## 2019-08-12 ENCOUNTER — Encounter (HOSPITAL_COMMUNITY): Admission: EM | Disposition: A | Discharge status: Home / Self Care | Payer: Self-pay | Source: Home / Self Care

## 2019-08-12 ENCOUNTER — Telehealth: Payer: Self-pay

## 2019-08-12 ENCOUNTER — Inpatient Hospital Stay (HOSPITAL_COMMUNITY)
Admission: EM | Admit: 2019-08-12 | Discharge: 2019-08-27 | DRG: 339 | Disposition: A | Discharge status: Home / Self Care | Payer: Medicare HMO | Attending: General Surgery | Admitting: General Surgery

## 2019-08-12 ENCOUNTER — Encounter (HOSPITAL_COMMUNITY): Payer: Self-pay

## 2019-08-12 ENCOUNTER — Emergency Department (HOSPITAL_COMMUNITY): Payer: Medicare HMO | Admitting: Certified Registered Nurse Anesthetist

## 2019-08-12 ENCOUNTER — Emergency Department (HOSPITAL_COMMUNITY): Payer: Medicare HMO

## 2019-08-12 ENCOUNTER — Other Ambulatory Visit: Payer: Self-pay

## 2019-08-12 DIAGNOSIS — K3521 Acute appendicitis with generalized peritonitis, with abscess: Secondary | ICD-10-CM | POA: Diagnosis not present

## 2019-08-12 DIAGNOSIS — R509 Fever, unspecified: Secondary | ICD-10-CM

## 2019-08-12 DIAGNOSIS — Z800 Family history of malignant neoplasm of digestive organs: Secondary | ICD-10-CM | POA: Diagnosis not present

## 2019-08-12 DIAGNOSIS — K3533 Acute appendicitis with perforation and localized peritonitis, with abscess: Secondary | ICD-10-CM | POA: Diagnosis present

## 2019-08-12 DIAGNOSIS — Z8719 Personal history of other diseases of the digestive system: Secondary | ICD-10-CM | POA: Diagnosis present

## 2019-08-12 DIAGNOSIS — Z4659 Encounter for fitting and adjustment of other gastrointestinal appliance and device: Secondary | ICD-10-CM

## 2019-08-12 DIAGNOSIS — Z20822 Contact with and (suspected) exposure to covid-19: Secondary | ICD-10-CM | POA: Diagnosis present

## 2019-08-12 DIAGNOSIS — Z965 Presence of tooth-root and mandibular implants: Secondary | ICD-10-CM | POA: Diagnosis present

## 2019-08-12 DIAGNOSIS — Z806 Family history of leukemia: Secondary | ICD-10-CM | POA: Diagnosis not present

## 2019-08-12 DIAGNOSIS — K3532 Acute appendicitis with perforation and localized peritonitis, without abscess: Secondary | ICD-10-CM

## 2019-08-12 DIAGNOSIS — G4733 Obstructive sleep apnea (adult) (pediatric): Secondary | ICD-10-CM | POA: Diagnosis present

## 2019-08-12 DIAGNOSIS — R1110 Vomiting, unspecified: Secondary | ICD-10-CM | POA: Diagnosis not present

## 2019-08-12 DIAGNOSIS — M1712 Unilateral primary osteoarthritis, left knee: Secondary | ICD-10-CM | POA: Diagnosis not present

## 2019-08-12 DIAGNOSIS — I82441 Acute embolism and thrombosis of right tibial vein: Secondary | ICD-10-CM | POA: Diagnosis not present

## 2019-08-12 DIAGNOSIS — Z4682 Encounter for fitting and adjustment of non-vascular catheter: Secondary | ICD-10-CM | POA: Diagnosis not present

## 2019-08-12 DIAGNOSIS — K353 Acute appendicitis with localized peritonitis, without perforation or gangrene: Secondary | ICD-10-CM | POA: Diagnosis not present

## 2019-08-12 DIAGNOSIS — I82453 Acute embolism and thrombosis of peroneal vein, bilateral: Secondary | ICD-10-CM | POA: Diagnosis not present

## 2019-08-12 DIAGNOSIS — R1031 Right lower quadrant pain: Secondary | ICD-10-CM

## 2019-08-12 DIAGNOSIS — Z86718 Personal history of other venous thrombosis and embolism: Secondary | ICD-10-CM

## 2019-08-12 DIAGNOSIS — Z9989 Dependence on other enabling machines and devices: Secondary | ICD-10-CM | POA: Diagnosis not present

## 2019-08-12 DIAGNOSIS — K219 Gastro-esophageal reflux disease without esophagitis: Secondary | ICD-10-CM | POA: Diagnosis not present

## 2019-08-12 DIAGNOSIS — K567 Ileus, unspecified: Secondary | ICD-10-CM | POA: Diagnosis not present

## 2019-08-12 DIAGNOSIS — R109 Unspecified abdominal pain: Secondary | ICD-10-CM | POA: Diagnosis not present

## 2019-08-12 DIAGNOSIS — R609 Edema, unspecified: Secondary | ICD-10-CM | POA: Diagnosis not present

## 2019-08-12 HISTORY — PX: LAPAROSCOPIC APPENDECTOMY: SHX408

## 2019-08-12 LAB — CBC
HCT: 38.6 % — ABNORMAL LOW (ref 39.0–52.0)
Hemoglobin: 12 g/dL — ABNORMAL LOW (ref 13.0–17.0)
MCH: 27.5 pg (ref 26.0–34.0)
MCHC: 31.1 g/dL (ref 30.0–36.0)
MCV: 88.5 fL (ref 80.0–100.0)
Platelets: 308 10*3/uL (ref 150–400)
RBC: 4.36 MIL/uL (ref 4.22–5.81)
RDW: 13.6 % (ref 11.5–15.5)
WBC: 10.8 10*3/uL — ABNORMAL HIGH (ref 4.0–10.5)
nRBC: 0 % (ref 0.0–0.2)

## 2019-08-12 LAB — COMPREHENSIVE METABOLIC PANEL
ALT: 34 U/L (ref 0–44)
AST: 26 U/L (ref 15–41)
Albumin: 3.3 g/dL — ABNORMAL LOW (ref 3.5–5.0)
Alkaline Phosphatase: 79 U/L (ref 38–126)
Anion gap: 9 (ref 5–15)
BUN: 12 mg/dL (ref 8–23)
CO2: 25 mmol/L (ref 22–32)
Calcium: 9.1 mg/dL (ref 8.9–10.3)
Chloride: 103 mmol/L (ref 98–111)
Creatinine, Ser: 1.04 mg/dL (ref 0.61–1.24)
GFR calc Af Amer: >60 mL/min (ref >60)
GFR calc non Af Amer: >60 mL/min (ref >60)
Glucose, Bld: 112 mg/dL — ABNORMAL HIGH (ref 70–99)
Potassium: 3.8 mmol/L (ref 3.5–5.1)
Sodium: 137 mmol/L (ref 135–145)
Total Bilirubin: 0.8 mg/dL (ref 0.3–1.2)
Total Protein: 6.8 g/dL (ref 6.5–8.1)

## 2019-08-12 LAB — URINALYSIS, ROUTINE W REFLEX MICROSCOPIC
Bacteria, UA: NONE SEEN
Bilirubin Urine: NEGATIVE
Glucose, UA: NEGATIVE mg/dL
Hgb urine dipstick: NEGATIVE
Ketones, ur: NEGATIVE mg/dL
Leukocytes,Ua: NEGATIVE
Nitrite: NEGATIVE
Protein, ur: 30 mg/dL — AB
Specific Gravity, Urine: 1.024 (ref 1.005–1.030)
pH: 5 (ref 5.0–8.0)

## 2019-08-12 LAB — RESPIRATORY PANEL BY RT PCR (FLU A&B, COVID)
Influenza A by PCR: NEGATIVE
Influenza B by PCR: NEGATIVE
SARS Coronavirus 2 by RT PCR: NEGATIVE

## 2019-08-12 LAB — LIPASE, BLOOD: Lipase: 23 U/L (ref 11–51)

## 2019-08-12 SURGERY — APPENDECTOMY, LAPAROSCOPIC
Anesthesia: General | Site: Abdomen

## 2019-08-12 MED ORDER — METRONIDAZOLE IN NACL 5-0.79 MG/ML-% IV SOLN
500.0000 mg | Freq: Once | INTRAVENOUS | Status: AC
  Administered 2019-08-12: 17:00:00 500 mg via INTRAVENOUS
  Filled 2019-08-12: qty 100

## 2019-08-12 MED ORDER — SODIUM CHLORIDE 0.9 % IR SOLN
Status: DC | PRN
  Administered 2019-08-12: 1000 mL

## 2019-08-12 MED ORDER — FENTANYL CITRATE (PF) 100 MCG/2ML IJ SOLN
INTRAMUSCULAR | Status: DC | PRN
  Administered 2019-08-12: 50 ug via INTRAVENOUS
  Administered 2019-08-12: 100 ug via INTRAVENOUS

## 2019-08-12 MED ORDER — MIDAZOLAM HCL 2 MG/2ML IJ SOLN
INTRAMUSCULAR | Status: AC
  Filled 2019-08-12: qty 2

## 2019-08-12 MED ORDER — LIDOCAINE HCL (CARDIAC) PF 100 MG/5ML IV SOSY
PREFILLED_SYRINGE | INTRAVENOUS | Status: DC | PRN
  Administered 2019-08-12: 60 mg via INTRAVENOUS

## 2019-08-12 MED ORDER — ONDANSETRON HCL 4 MG/2ML IJ SOLN
INTRAMUSCULAR | Status: DC | PRN
  Administered 2019-08-12: 4 mg via INTRAVENOUS

## 2019-08-12 MED ORDER — GLYCOPYRROLATE 0.2 MG/ML IJ SOLN
INTRAMUSCULAR | Status: DC | PRN
Start: 2019-08-12 — End: 2019-08-12
  Administered 2019-08-12: .2 mg via INTRAVENOUS

## 2019-08-12 MED ORDER — MORPHINE SULFATE (PF) 2 MG/ML IV SOLN: 2.0000 mg | INTRAVENOUS | Status: DC | PRN

## 2019-08-12 MED ORDER — BUPIVACAINE HCL (PF) 0.25 % IJ SOLN
INTRAMUSCULAR | Status: AC
  Filled 2019-08-12: qty 30

## 2019-08-12 MED ORDER — ONDANSETRON HCL 4 MG/2ML IJ SOLN
4.0000 mg | Freq: Four times a day (QID) | INTRAMUSCULAR | Status: DC | PRN
  Administered 2019-08-14: 4 mg via INTRAVENOUS
  Filled 2019-08-12: qty 2

## 2019-08-12 MED ORDER — FENTANYL CITRATE (PF) 250 MCG/5ML IJ SOLN
INTRAMUSCULAR | Status: AC
  Filled 2019-08-12: qty 5

## 2019-08-12 MED ORDER — DEXAMETHASONE SODIUM PHOSPHATE 4 MG/ML IJ SOLN
INTRAMUSCULAR | Status: DC | PRN
  Administered 2019-08-12: 5 mg via INTRAVENOUS

## 2019-08-12 MED ORDER — PROPOFOL 10 MG/ML IV BOLUS
INTRAVENOUS | Status: AC
  Filled 2019-08-12: qty 20

## 2019-08-12 MED ORDER — BUPIVACAINE HCL (PF) 0.25 % IJ SOLN
INTRAMUSCULAR | Status: DC | PRN
  Administered 2019-08-12: 30 mL

## 2019-08-12 MED ORDER — HYDROMORPHONE HCL 1 MG/ML IJ SOLN: 0.2500 mg | INTRAMUSCULAR | Status: DC | PRN

## 2019-08-12 MED ORDER — LACTATED RINGERS IV SOLN: INTRAVENOUS | Status: DC | PRN

## 2019-08-12 MED ORDER — ACETAMINOPHEN 10 MG/ML IV SOLN
INTRAVENOUS | Status: AC
  Filled 2019-08-12: qty 100

## 2019-08-12 MED ORDER — ROCURONIUM BROMIDE 100 MG/10ML IV SOLN
INTRAVENOUS | Status: DC | PRN
  Administered 2019-08-12: 50 mg via INTRAVENOUS

## 2019-08-12 MED ORDER — SUGAMMADEX SODIUM 200 MG/2ML IV SOLN
INTRAVENOUS | Status: DC | PRN
Start: 2019-08-12 — End: 2019-08-12
  Administered 2019-08-12: 200 mg via INTRAVENOUS

## 2019-08-12 MED ORDER — ENOXAPARIN SODIUM 40 MG/0.4ML ~~LOC~~ SOLN
40.0000 mg | SUBCUTANEOUS | Status: DC
  Administered 2019-08-13 – 2019-08-26 (×14): 40 mg via SUBCUTANEOUS
  Filled 2019-08-12 (×14): qty 0.4

## 2019-08-12 MED ORDER — MORPHINE SULFATE (PF) 4 MG/ML IV SOLN: 4.0000 mg | Freq: Once | INTRAVENOUS | Status: DC

## 2019-08-12 MED ORDER — ACETAMINOPHEN 325 MG PO TABS
650.0000 mg | ORAL_TABLET | Freq: Four times a day (QID) | ORAL | Status: DC | PRN
  Administered 2019-08-25: 650 mg via ORAL
  Filled 2019-08-12: qty 2

## 2019-08-12 MED ORDER — ONDANSETRON HCL 4 MG/2ML IJ SOLN: 4.0000 mg | Freq: Once | INTRAMUSCULAR | Status: DC

## 2019-08-12 MED ORDER — DIPHENHYDRAMINE HCL 50 MG/ML IJ SOLN: 12.5000 mg | Freq: Four times a day (QID) | INTRAMUSCULAR | Status: DC | PRN

## 2019-08-12 MED ORDER — MIDAZOLAM HCL 5 MG/5ML IJ SOLN
INTRAMUSCULAR | Status: DC | PRN
  Administered 2019-08-12: 2 mg via INTRAVENOUS

## 2019-08-12 MED ORDER — OXYCODONE HCL 5 MG PO TABS
5.0000 mg | ORAL_TABLET | ORAL | Status: DC | PRN
  Administered 2019-08-12 – 2019-08-14 (×4): 10 mg via ORAL
  Filled 2019-08-12 (×4): qty 2

## 2019-08-12 MED ORDER — PROPOFOL 10 MG/ML IV BOLUS
INTRAVENOUS | Status: DC | PRN
  Administered 2019-08-12: 180 mg via INTRAVENOUS

## 2019-08-12 MED ORDER — ACETAMINOPHEN 10 MG/ML IV SOLN: 1000.0000 mg | Freq: Once | INTRAVENOUS | Status: DC

## 2019-08-12 MED ORDER — STERILE WATER FOR IRRIGATION IR SOLN
Status: DC | PRN
  Administered 2019-08-12: 1000 mL

## 2019-08-12 MED ORDER — METRONIDAZOLE IN NACL 5-0.79 MG/ML-% IV SOLN
500.0000 mg | Freq: Three times a day (TID) | INTRAVENOUS | Status: DC
  Administered 2019-08-12 – 2019-08-15 (×9): 500 mg via INTRAVENOUS
  Filled 2019-08-12 (×9): qty 100

## 2019-08-12 MED ORDER — ONDANSETRON 4 MG PO TBDP: 4.0000 mg | ORAL_TABLET | Freq: Four times a day (QID) | ORAL | Status: DC | PRN

## 2019-08-12 MED ORDER — 0.9 % SODIUM CHLORIDE (POUR BTL) OPTIME
TOPICAL | Status: DC | PRN
  Administered 2019-08-12: 1000 mL

## 2019-08-12 MED ORDER — IOHEXOL 300 MG/ML  SOLN
100.0000 mL | Freq: Once | INTRAMUSCULAR | Status: AC | PRN
  Administered 2019-08-12: 100 mL via INTRAVENOUS

## 2019-08-12 MED ORDER — ACETAMINOPHEN 10 MG/ML IV SOLN
INTRAVENOUS | Status: DC | PRN
  Administered 2019-08-12: 1000 mg via INTRAVENOUS

## 2019-08-12 MED ORDER — SODIUM CHLORIDE 0.9 % IV SOLN
2.0000 g | Freq: Once | INTRAVENOUS | Status: AC
  Administered 2019-08-12: 2 g via INTRAVENOUS
  Filled 2019-08-12: qty 20

## 2019-08-12 MED ORDER — GABAPENTIN 300 MG PO CAPS
300.0000 mg | ORAL_CAPSULE | Freq: Two times a day (BID) | ORAL | Status: DC
  Administered 2019-08-12 – 2019-08-16 (×7): 300 mg via ORAL
  Filled 2019-08-12 (×8): qty 1

## 2019-08-12 MED ORDER — SODIUM CHLORIDE 0.9 % IV BOLUS
1000.0000 mL | Freq: Once | INTRAVENOUS | Status: AC
  Administered 2019-08-12: 1000 mL via INTRAVENOUS

## 2019-08-12 MED ORDER — SUCCINYLCHOLINE CHLORIDE 20 MG/ML IJ SOLN
INTRAMUSCULAR | Status: DC | PRN
  Administered 2019-08-12: 100 mg via INTRAVENOUS

## 2019-08-12 MED ORDER — SODIUM CHLORIDE 0.9 % IV SOLN
2.0000 g | INTRAVENOUS | Status: DC
  Administered 2019-08-13 – 2019-08-14 (×2): 2 g via INTRAVENOUS
  Filled 2019-08-12 (×2): qty 20

## 2019-08-12 MED ORDER — LACTATED RINGERS IV SOLN: INTRAVENOUS | Status: DC

## 2019-08-12 MED ORDER — DIPHENHYDRAMINE HCL 12.5 MG/5ML PO ELIX: 12.5000 mg | ORAL_SOLUTION | Freq: Four times a day (QID) | ORAL | Status: DC | PRN

## 2019-08-12 MED ORDER — POTASSIUM CHLORIDE IN NACL 20-0.9 MEQ/L-% IV SOLN
INTRAVENOUS | Status: AC
  Filled 2019-08-12 (×15): qty 1000

## 2019-08-12 MED ORDER — ACETAMINOPHEN 650 MG RE SUPP: 650.0000 mg | Freq: Four times a day (QID) | RECTAL | Status: DC | PRN

## 2019-08-12 MED ORDER — SODIUM CHLORIDE 0.9% FLUSH
3.0000 mL | Freq: Once | INTRAVENOUS | Status: AC
  Administered 2019-08-12: 3 mL via INTRAVENOUS

## 2019-08-12 SURGICAL SUPPLY — 47 items
APPLIER CLIP ROT 10 11.4 M/L (STAPLE)
BENZOIN TINCTURE PRP APPL 2/3 (GAUZE/BANDAGES/DRESSINGS) ×2 IMPLANT
BLADE CLIPPER SURG (BLADE) ×2 IMPLANT
CANISTER SUCT 3000ML PPV (MISCELLANEOUS) IMPLANT
CHLORAPREP W/TINT 26 (MISCELLANEOUS) ×2 IMPLANT
CLIP APPLIE ROT 10 11.4 M/L (STAPLE) IMPLANT
COVER SURGICAL LIGHT HANDLE (MISCELLANEOUS) ×2 IMPLANT
COVER WAND RF STERILE (DRAPES) IMPLANT
CUTTER FLEX LINEAR 45M (STAPLE) ×2 IMPLANT
DRSG TEGADERM 2-3/8X2-3/4 SM (GAUZE/BANDAGES/DRESSINGS) ×2 IMPLANT
DRSG TEGADERM 4X4.75 (GAUZE/BANDAGES/DRESSINGS) ×2 IMPLANT
ELECT REM PT RETURN 9FT ADLT (ELECTROSURGICAL) ×2
ELECTRODE REM PT RTRN 9FT ADLT (ELECTROSURGICAL) ×1 IMPLANT
ENDOLOOP SUT PDS II  0 18 (SUTURE)
ENDOLOOP SUT PDS II 0 18 (SUTURE) IMPLANT
GAUZE SPONGE 2X2 8PLY STRL LF (GAUZE/BANDAGES/DRESSINGS) ×1 IMPLANT
GLOVE BIO SURGEON STRL SZ7 (GLOVE) ×2 IMPLANT
GLOVE BIOGEL PI IND STRL 7.5 (GLOVE) ×1 IMPLANT
GLOVE BIOGEL PI IND STRL 8 (GLOVE) ×1 IMPLANT
GLOVE BIOGEL PI INDICATOR 7.5 (GLOVE) ×1
GLOVE BIOGEL PI INDICATOR 8 (GLOVE) ×1
GLOVE SURG SS PI 7.0 STRL IVOR (GLOVE) ×4 IMPLANT
GOWN STRL REUS W/ TWL LRG LVL3 (GOWN DISPOSABLE) ×2 IMPLANT
GOWN STRL REUS W/TWL LRG LVL3 (GOWN DISPOSABLE) ×4
KIT BASIN OR (CUSTOM PROCEDURE TRAY) ×2 IMPLANT
KIT TURNOVER KIT B (KITS) ×2 IMPLANT
NS IRRIG 1000ML POUR BTL (IV SOLUTION) ×2 IMPLANT
PAD ARMBOARD 7.5X6 YLW CONV (MISCELLANEOUS) ×4 IMPLANT
POUCH SPECIMEN RETRIEVAL 10MM (ENDOMECHANICALS) ×2 IMPLANT
RELOAD STAPLE TA45 3.5 REG BLU (ENDOMECHANICALS) ×2 IMPLANT
SCISSORS LAP 5X35 DISP (ENDOMECHANICALS) IMPLANT
SET IRRIG TUBING LAPAROSCOPIC (IRRIGATION / IRRIGATOR) ×2 IMPLANT
SET TUBE SMOKE EVAC HIGH FLOW (TUBING) ×2 IMPLANT
SHEARS HARMONIC ACE PLUS 36CM (ENDOMECHANICALS) ×2 IMPLANT
SLEEVE ENDOPATH XCEL 5M (ENDOMECHANICALS) ×2 IMPLANT
SPECIMEN JAR SMALL (MISCELLANEOUS) ×2 IMPLANT
SPONGE GAUZE 2X2 8PLY STRL LF (GAUZE/BANDAGES/DRESSINGS) ×2 IMPLANT
SPONGE GAUZE 2X2 STER 10/PKG (GAUZE/BANDAGES/DRESSINGS) ×1
STRIP CLOSURE SKIN 1/2X4 (GAUZE/BANDAGES/DRESSINGS) ×2 IMPLANT
SUT MNCRL AB 4-0 PS2 18 (SUTURE) ×2 IMPLANT
SYR CONTROL 10ML LL (SYRINGE) ×2 IMPLANT
TOWEL GREEN STERILE (TOWEL DISPOSABLE) ×2 IMPLANT
TOWEL GREEN STERILE FF (TOWEL DISPOSABLE) ×2 IMPLANT
TRAY LAPAROSCOPIC MC (CUSTOM PROCEDURE TRAY) ×2 IMPLANT
TROCAR XCEL BLUNT TIP 100MML (ENDOMECHANICALS) ×2 IMPLANT
TROCAR XCEL NON-BLD 5MMX100MML (ENDOMECHANICALS) ×2 IMPLANT
WATER STERILE IRR 1000ML POUR (IV SOLUTION) ×2 IMPLANT

## 2019-08-12 NOTE — Op Note (Signed)
Appendectomy, Lap, Procedure Note  Indications: The patient presented with a one-week history of right-sided abdominal pain. A CT revealed findings consistent with acute appendicitis but no clear sign of perforation or abscess..  Pre-operative Diagnosis: Acute appendicitis with generalized peritonitis  Post-operative Diagnosis: Perforated acute appendicitis with peritoneal abscess  Surgeon: Maia Petties   Assistants: none  Anesthesia: General endotracheal anesthesia  ASA Class: 2E  Procedure Details  The patient was seen again in the Holding Room. The risks, benefits, complications, treatment options, and expected outcomes were discussed with the patient and/or family. The possibilities of reaction to medication, perforation of viscus, bleeding, recurrent infection, finding a normal appendix, the need for additional procedures, failure to diagnose a condition, and creating a complication requiring transfusion or operation were discussed. There was concurrence with the proposed plan and informed consent was obtained. The site of surgery was properly noted. The patient was taken to Operating Room, identified as Joel Lopez and the procedure verified as Appendectomy. A Time Out was held and the above information confirmed.  The patient was placed in the supine position and general anesthesia was induced.  The abdomen was prepped and draped in a sterile fashion. A one centimeter supraumbilical incision was made.  Dissection was carried down to the fascia bluntly.  The fascia was incised vertically.  We entered the peritoneal cavity bluntly.  A pursestring suture was passed around the incision with a 0 Vicryl.  The Hasson cannula was introduced into the abdomen and the tails of the suture were used to hold the Hasson in place.   The pneumoperitoneum was then established maintaining a maximum pressure of 15 mmHg.  Additional 5 mm cannulas then placed in the left lower quadrant of the abdomen  and the right upper quadrant under direct visualization. A careful evaluation of the entire abdomen was carried out. The patient was placed in Trendelenburg and left lateral decubitus position.  The scope was moved to the right upper quadrant port site. The ascending colon and the terminal ileum appear normal.  The cecum is locally thickened and adherent to the lateral abdominal wall.  The cecum was bluntly mobilized medially and we unroofed a small abscess which was immediately evacuated with suction.  The tip of the appendix was identified heading superiorly in the paracolic gutter.  This was grasped and we carefully dissected this away from the lateral abdominal wall with blunt dissection and the harmonic scalpel.  The ligament of Johnella Moloney is thickened and adherent to the base of the appendix, but we were able to dissect this away bluntly. The appendix was skeletonized with the harmonic scalpel.   The appendix was divided at its base using an endo-GIA stapler. No appendiceal stump was left in place. There was no evidence of bleeding, leakage, or complication after division of the appendix. Irrigation was also performed and irrigate suctioned from the abdomen as well.  The umbilical port site was closed with the purse string suture. There was no residual palpable fascial defect.  The trocar site skin wounds were closed with 4-0 Monocryl.  Instrument, sponge, and needle counts were correct at the conclusion of the case.   Findings: The appendix was found to be inflamed. There were signs of necrosis.  There was perforation. There was abscess formation.  Estimated Blood Loss:  less than 100 mL         Drains: none         Specimens: appendix  Complications:  None; patient tolerated the procedure well.         Disposition: PACU - hemodynamically stable.         Condition: stable  Imogene Burn. Georgette Dover, MD, [facility]  General/ Trauma Surgery   08/12/2019 8:53 PM

## 2019-08-12 NOTE — ED Triage Notes (Signed)
Patient complains of RLQ intermittent abdominal pain with low grade fever x 1 week. No nausea, no vomiting

## 2019-08-12 NOTE — Anesthesia Preprocedure Evaluation (Addendum)
Anesthesia Evaluation  Patient identified by MRN, date of birth, ID band Patient awake    Reviewed: Allergy & Precautions, H&P , NPO status , Patient's Chart, lab work & pertinent test results  Airway Mallampati: III  TM Distance: >3 FB Neck ROM: Full    Dental no notable dental hx. (+) Teeth Intact, Dental Advisory Given   Pulmonary sleep apnea ,    Pulmonary exam normal breath sounds clear to auscultation       Cardiovascular negative cardio ROS   Rhythm:Regular Rate:Normal     Neuro/Psych negative neurological ROS  negative psych ROS   GI/Hepatic Neg liver ROS, GERD  Controlled,  Endo/Other  negative endocrine ROS  Renal/GU negative Renal ROS  negative genitourinary   Musculoskeletal  (+) Arthritis , Osteoarthritis,    Abdominal   Peds  Hematology negative hematology ROS (+)   Anesthesia Other Findings   Reproductive/Obstetrics negative OB ROS                            Anesthesia Physical Anesthesia Plan  ASA: II  Anesthesia Plan: General   Post-op Pain Management:    Induction: Intravenous  PONV Risk Score and Plan: 3 and Ondansetron, Dexamethasone and Midazolam  Airway Management Planned: Oral ETT  Additional Equipment:   Intra-op Plan:   Post-operative Plan: Extubation in OR  Informed Consent: I have reviewed the patients History and Physical, chart, labs and discussed the procedure including the risks, benefits and alternatives for the proposed anesthesia with the patient or authorized representative who has indicated his/her understanding and acceptance.     Dental advisory given  Plan Discussed with: CRNA  Anesthesia Plan Comments:        Anesthesia Quick Evaluation

## 2019-08-12 NOTE — Anesthesia Postprocedure Evaluation (Signed)
Anesthesia Post Note  Patient: Joel Lopez  Procedure(s) Performed: APPENDECTOMY LAPAROSCOPIC (N/A Abdomen)     Patient location during evaluation: PACU Anesthesia Type: General Level of consciousness: awake and alert Pain management: pain level controlled Vital Signs Assessment: post-procedure vital signs reviewed and stable Respiratory status: spontaneous breathing, nonlabored ventilation and respiratory function stable Cardiovascular status: blood pressure returned to baseline and stable Postop Assessment: no apparent nausea or vomiting Anesthetic complications: no    Last Vitals:  Vitals:   08/12/19 2130 08/12/19 2145  BP: 128/75 121/70  Pulse: 76 81  Resp: (!) 23 (!) 22  Temp:    SpO2: 92% 94%    Last Pain:  Vitals:   08/12/19 2130  TempSrc:   PainSc: 0-No pain                 Lean Fayson,W. EDMOND

## 2019-08-12 NOTE — Anesthesia Procedure Notes (Signed)
Procedure Name: Intubation Date/Time: 08/12/2019 7:37 PM Performed by: Oletta Lamas, CRNA Pre-anesthesia Checklist: Patient identified, Emergency Drugs available, Suction available and Patient being monitored Patient Re-evaluated:Patient Re-evaluated prior to induction Oxygen Delivery Method: Circle System Utilized Preoxygenation: Pre-oxygenation with 100% oxygen Induction Type: IV induction Ventilation: Mask ventilation without difficulty Laryngoscope Size: Mac and 4 Grade View: Grade II Tube type: Oral Tube size: 7.5 mm Number of attempts: 2 Airway Equipment and Method: Stylet Placement Confirmation: ETT inserted through vocal cords under direct vision,  positive ETCO2 and breath sounds checked- equal and bilateral Secured at: 23 cm Tube secured with: Tape Dental Injury: Teeth and Oropharynx as per pre-operative assessment

## 2019-08-12 NOTE — Transfer of Care (Signed)
Immediate Anesthesia Transfer of Care Note  Patient: Joel Lopez  Procedure(s) Performed: APPENDECTOMY LAPAROSCOPIC (N/A Abdomen)  Patient Location: PACU  Anesthesia Type:General  Level of Consciousness: drowsy and patient cooperative  Airway & Oxygen Therapy: Patient Spontanous Breathing  Post-op Assessment: Report given to RN and Post -op Vital signs reviewed and stable  Post vital signs: Reviewed and stable  Last Vitals:  Vitals Value Taken Time  BP 127/72 08/12/19 2116  Temp 37.3 C 08/12/19 2100  Pulse 76 08/12/19 2118  Resp 21 08/12/19 2118  SpO2 94 % 08/12/19 2118  Vitals shown include unvalidated device data.  Last Pain:  Vitals:   08/12/19 2100  TempSrc:   PainSc: Asleep         Complications: No apparent anesthesia complications

## 2019-08-12 NOTE — Telephone Encounter (Signed)
Pt thinks appendix is infected; pt has pain level of 11-23-08 on rt lower side. Pt said has had pain for 1 wk and has worsened. If presses on the rt side pain is intensified. No N&V. Pt has fever of 100.5 and pt is having diarrhea.When pt rides in a car it intensifies the rt side pain also.pt declined 911 but pt is going to [facility] ED now. FYI to Dr Damita Dunnings and Dr Darnell Level .

## 2019-08-12 NOTE — Telephone Encounter (Signed)
Noted. Will await ER report.  Thanks.  °

## 2019-08-12 NOTE — ED Provider Notes (Signed)
[facility] EMERGENCY DEPARTMENT Provider Note   CSN: [id number] Arrival date & time: 08/12/19  0944     History Chief Complaint  Patient presents with  . Abdominal Pain    RLQ    Joel Lopez is a 69 y.o. male.  Joel Lopez is a 69 y.o. male with a history of GERD, arthritis, anemia, sleep apnea, who presents to the emergency department for evaluation of right lower quadrant abdominal pain.  He reports pain has been present for about a week.  It has been intermittent and comes and goes.  He states if he stays very still the pain seems to subside but since this morning pain has been worse and more persistent.  He states that yesterday he had a fever of 101.3 and today his temp was 100.5.  He denies associated nausea or vomiting.  Has had a few loose stools, no blood in the stool noted.  No chest pain, shortness of breath or cough.  States a little over a year ago he had a left-sided hernia repair but otherwise has had no abdominal surgeries, still has his appendix and gallbladder.  He has not taken any medicine for symptoms prior to arrival.        Past Medical History:  Diagnosis Date  . Anemia of other chronic disease    normal blood blood counts since 2007  . Arthritis   . GERD (gastroesophageal reflux disease)   . Hemorrhoids   . Labyrinthitis   . Obstructive sleep apnea    study '07 - AHI 17, lowest O2 sat 86%. not using CPAP, couldn't tolerate CPAP  . Plantar fasciitis   . Right rotator cuff tear 2013   90% normal function.  . Rupture long head biceps tendon 2014   right. 90% normal function    Patient Active Problem List   Diagnosis Date Noted  . Acute pain of left knee 09/19/2018  . Right hand pain 02/17/2018  . Advance care planning 01/15/2015  . Incomplete tear of rotator cuff 08/17/2011  . Routine general medical examination at a health care facility 01/11/2011  . ARTHRITIS, LEFT KNEE 10/09/2007  . Anemia of other chronic disease  09/18/2007  . GERD 09/18/2007    Past Surgical History:  Procedure Laterality Date  . DENTAL SURGERY     implant  . INGUINAL HERNIA REPAIR Left 04/26/2018   Procedure: LEFT INGUINAL HERNIA REPAIR WITH MESH;  Surgeon: Jovita Kussmaul, MD;  Location: [facility];  Service: General;  Laterality: Left;  . INSERTION OF MESH Left 04/26/2018   Procedure: INSERTION OF MESH;  Surgeon: Jovita Kussmaul, MD;  Location: [facility];  Service: General;  Laterality: Left;  . ORIF ULNAR FRACTURE Left    had screws and plate; had screws and plate removed 075-GRM       Family History  Problem Relation Age of Onset  . Colon cancer Maternal Aunt   . Esophageal cancer Maternal Uncle   . Leukemia Mother   . Diabetes Neg Hx   . Heart disease Neg Hx   . Hyperlipidemia Neg Hx   . Stroke Neg Hx   . Prostate cancer Neg Hx   . Rectal cancer Neg Hx   . Stomach cancer Neg Hx     Social History   Tobacco Use  . Smoking status: Never Smoker  . Smokeless tobacco: Never Used  Substance Use Topics  . Alcohol use: Yes    Alcohol/week: 0.0 standard drinks  Comment: social  . Drug use: No    Home Medications Prior to Admission medications   Not on File    Allergies    Patient has no known allergies.  Review of Systems   Review of Systems  Constitutional: Positive for chills and fever.  HENT: Negative.   Respiratory: Negative for cough and shortness of breath.   Cardiovascular: Negative for chest pain.  Gastrointestinal: Positive for abdominal pain and diarrhea. Negative for blood in stool, nausea and vomiting.  Genitourinary: Negative for dysuria and frequency.  Musculoskeletal: Negative for arthralgias and myalgias.  Skin: Negative for color change and rash.  Neurological: Negative for dizziness, syncope and light-headedness.    Physical Exam Updated Vital Signs BP 138/78 (BP Location: Left Arm)   Pulse 90   Temp 99.2 F (37.3 C) (Oral)   Resp 18   Ht 5\' 11"  (1.803 m)   Wt 88.5 kg   SpO2 97%    BMI 27.20 kg/m   Physical Exam Vitals and nursing note reviewed.  Constitutional:      General: He is not in acute distress.    Appearance: He is well-developed and normal weight. He is not ill-appearing or diaphoretic.  HENT:     Head: Normocephalic and atraumatic.  Eyes:     General:        Right eye: No discharge.        Left eye: No discharge.     Pupils: Pupils are equal, round, and reactive to light.  Cardiovascular:     Rate and Rhythm: Normal rate and regular rhythm.     Heart sounds: Normal heart sounds.  Pulmonary:     Effort: Pulmonary effort is normal. No respiratory distress.     Breath sounds: Normal breath sounds. No wheezing or rales.  Abdominal:     General: Bowel sounds are normal. There is no distension.     Palpations: Abdomen is soft. There is no mass.     Tenderness: There is abdominal tenderness in the right upper quadrant and right lower quadrant. There is guarding.     Comments: Abdomen is soft and nondistended, bowel sounds present throughout, there is significant tenderness in the right upper and lower quadrants, most notably in the right lower quadrant with guarding present, no left-sided abdominal tenderness present  Musculoskeletal:        General: No deformity.     Cervical back: Neck supple.  Skin:    General: Skin is warm and dry.     Capillary Refill: Capillary refill takes less than 2 seconds.  Neurological:     Mental Status: He is alert.     Coordination: Coordination normal.     Comments: Speech is clear, able to follow commands Moves extremities without ataxia, coordination intact  Psychiatric:        Mood and Affect: Mood normal.        Behavior: Behavior normal.     ED Results / Procedures / Treatments   Labs (all labs ordered are listed, but only abnormal results are displayed) Labs Reviewed  COMPREHENSIVE METABOLIC PANEL - Abnormal; Notable for the following components:      Result Value   Glucose, Bld 112 (*)    Albumin 3.3  (*)    All other components within normal limits  CBC - Abnormal; Notable for the following components:   WBC 10.8 (*)    Hemoglobin 12.0 (*)    HCT 38.6 (*)    All other components within normal  limits  URINALYSIS, ROUTINE W REFLEX MICROSCOPIC - Abnormal; Notable for the following components:   Protein, ur 30 (*)    All other components within normal limits  LIPASE, BLOOD    EKG None  Radiology No results found.  Procedures Procedures (including critical care time)  Medications Ordered in ED Medications  sodium chloride flush (NS) 0.9 % injection 3 mL (has no administration in time range)  sodium chloride 0.9 % bolus 1,000 mL (has no administration in time range)  ondansetron (ZOFRAN) injection 4 mg (has no administration in time range)  morphine 4 MG/ML injection 4 mg (has no administration in time range)    ED Course  I have reviewed the triage vital signs and the nursing notes.  Pertinent labs & imaging results that were available during my care of the patient were reviewed by me and considered in my medical decision making (see chart for details).    MDM Rules/Calculators/A&P                     Patient presents to the ED with complaints of abdominal pain. Patient nontoxic appearing, in no apparent distress, vitals show low-grade temp, otherwise unremarkable. On exam patient tender to palpation in the right upper and lower quadrant, most notably in the right lower quadrant with guarding. Will evaluate with labs and CT abdomen pelvis. Analgesics, anti-emetics, and fluids administered.   Differential includes: appendicitis, cholecystitis, pancreatitis, diverticulitis, bowel obstruction/perforation, colitis, pyleonephritis, kidney stone  I have independently ordered, reviewed and interpreted all labs and imaging:  CBC: Mild leukocytosis, stable hgb CMP: glucose pf 112, no other significant electrolyte derangements, normal renal and liver function Lipase: WNL UA: No  hematuria or evidence of infection  At shift change CT abdomen pelvis is still pending, patient remains hemodynamically stable.  High concern for appendicitis.  At shift change care signed out to [facility] who will follow up on CT and disposition appropriately.  Final Clinical Impression(s) / ED Diagnoses Final diagnoses:  RLQ abdominal pain    Rx / DC Orders ED Discharge Orders    None       Janet Berlin 08/12/19 1524    Dorie Rank, MD 08/13/19 [phone]

## 2019-08-12 NOTE — H&P (Signed)
Joel Lopez is an 69 y.o. male.   Chief Complaint: RLQ abdominal pain HPI: This is a 69 year old male who presents with a week of lower abdominal pain that has now localized to the RLQ.  He was febrile up to 101.3 with some diarrhea, but no nausea or vomiting.  He came to the ED for evaluation.  CT scan showed acute appendicitis without sign of perforation, but significant stranding.  Past Medical History:  Diagnosis Date  . Anemia of other chronic disease    normal blood blood counts since 2007  . Arthritis   . GERD (gastroesophageal reflux disease)   . Hemorrhoids   . Labyrinthitis   . Obstructive sleep apnea    study '07 - AHI 17, lowest O2 sat 86%. not using CPAP, couldn't tolerate CPAP  . Plantar fasciitis   . Right rotator cuff tear 2013   90% normal function.  . Rupture long head biceps tendon 2014   right. 90% normal function    Past Surgical History:  Procedure Laterality Date  . DENTAL SURGERY     implant  . INGUINAL HERNIA REPAIR Left 04/26/2018   Procedure: LEFT INGUINAL HERNIA REPAIR WITH MESH;  Surgeon: Jovita Kussmaul, MD;  Location: [facility];  Service: General;  Laterality: Left;  . INSERTION OF MESH Left 04/26/2018   Procedure: INSERTION OF MESH;  Surgeon: Jovita Kussmaul, MD;  Location: [facility];  Service: General;  Laterality: Left;  . ORIF ULNAR FRACTURE Left    had screws and plate; had screws and plate removed 075-GRM    Family History  Problem Relation Age of Onset  . Colon cancer Maternal Aunt   . Esophageal cancer Maternal Uncle   . Leukemia Mother   . Diabetes Neg Hx   . Heart disease Neg Hx   . Hyperlipidemia Neg Hx   . Stroke Neg Hx   . Prostate cancer Neg Hx   . Rectal cancer Neg Hx   . Stomach cancer Neg Hx    Social History:  reports that he has never smoked. He has never used smokeless tobacco. He reports current alcohol use. He reports that he does not use drugs.  Allergies: No Known Allergies  Prior to Admission medications   Not on File      Results for orders placed or performed during the hospital encounter of 08/12/19 (from the past 48 hour(s))  Lipase, blood     Status: None   Collection Time: 08/12/19  9:55 AM  Result Value Ref Range   Lipase 23 11 - 51 U/L    Comment: Performed at [facility], [address], [place], [place] [address]  Comprehensive metabolic panel     Status: Abnormal   Collection Time: 08/12/19  9:55 AM  Result Value Ref Range   Sodium 137 135 - 145 mmol/L   Potassium 3.8 3.5 - 5.1 mmol/L   Chloride 103 98 - 111 mmol/L   CO2 25 22 - 32 mmol/L   Glucose, Bld 112 (H) 70 - 99 mg/dL    Comment: Glucose reference range applies only to samples taken after fasting for at least 8 hours.   BUN 12 8 - 23 mg/dL   Creatinine, Ser 1.04 0.61 - 1.24 mg/dL   Calcium 9.1 8.9 - 10.3 mg/dL   Total Protein 6.8 6.5 - 8.1 g/dL   Albumin 3.3 (L) 3.5 - 5.0 g/dL   AST 26 15 - 41 U/L   ALT 34 0 -  44 U/L   Alkaline Phosphatase 79 38 - 126 U/L   Total Bilirubin 0.8 0.3 - 1.2 mg/dL   GFR calc non Af Amer >60 >60 mL/min   GFR calc Af Amer >60 >60 mL/min   Anion gap 9 5 - 15    Comment: Performed at [facility] [address] [place], [place] [address]  CBC     Status: Abnormal   Collection Time: 08/12/19  9:55 AM  Result Value Ref Range   WBC 10.8 (H) 4.0 - 10.5 K/uL   RBC 4.36 4.22 - 5.81 MIL/uL   Hemoglobin 12.0 (L) 13.0 - 17.0 g/dL   HCT 38.6 (L) 39.0 - 52.0 %   MCV 88.5 80.0 - 100.0 fL   MCH 27.5 26.0 - 34.0 pg   MCHC 31.1 30.0 - 36.0 g/dL   RDW 13.6 11.5 - 15.5 %   Platelets 308 150 - 400 K/uL   nRBC 0.0 0.0 - 0.2 %    Comment: Performed at [facility], [place] [address], [place], [place] [address]  Urinalysis, Routine w reflex microscopic     Status: Abnormal   Collection Time: 08/12/19 10:39 AM  Result Value Ref Range   Color, Urine YELLOW YELLOW   APPearance CLEAR CLEAR   Specific Gravity, Urine 1.024 1.005 - 1.030   pH 5.0 5.0 - 8.0   Glucose, UA NEGATIVE NEGATIVE mg/dL    Hgb urine dipstick NEGATIVE NEGATIVE   Bilirubin Urine NEGATIVE NEGATIVE   Ketones, ur NEGATIVE NEGATIVE mg/dL   Protein, ur 30 (A) NEGATIVE mg/dL   Nitrite NEGATIVE NEGATIVE   Leukocytes,Ua NEGATIVE NEGATIVE   RBC / HPF 0-5 0 - 5 RBC/hpf   WBC, UA 0-5 0 - 5 WBC/hpf   Bacteria, UA NONE SEEN NONE SEEN   Squamous Epithelial / LPF 0-5 0 - 5   Mucus PRESENT    Hyaline Casts, UA PRESENT     Comment: Performed at [facility] [address] [place], [place] [address]   CT Abdomen Pelvis W Contrast  Result Date: 08/12/2019 CLINICAL DATA:  Right lower abdominal pain and fever EXAM: CT ABDOMEN AND PELVIS WITH CONTRAST TECHNIQUE: Multidetector CT imaging of the abdomen and pelvis was performed using the standard protocol following bolus administration of intravenous contrast. CONTRAST:  166mL OMNIPAQUE IOHEXOL 300 MG/ML  SOLN COMPARISON:  None. FINDINGS: Lower chest: There is bibasilar atelectasis. No lung base edema or airspace opacity. Hepatobiliary: There is an apparent cyst in the posterior segment right lobe of the liver inferiorly measuring 1.0 x 0.9 cm. There are several 3-4 mm foci of decreased attenuation in the liver, probably representing small cysts. No other liver lesions are evident. Gallbladder wall is not appreciably thickened. There is no evident biliary duct dilatation. Pancreas: There is no evident pancreatic mass or inflammatory focus. Spleen: No splenic lesions are evident. Adrenals/Urinary Tract: Adrenals bilaterally appear unremarkable. Kidneys bilaterally show no evident mass or hydronephrosis on either side. There is no appreciable renal or ureteral calculus on either side. Urinary bladder is midline with wall thickness within normal limits. Stomach/Bowel: There is no appreciable bowel wall or mesenteric thickening. No bowel obstruction evident. The terminal ileum appears unremarkable. No free air or portal venous air. Vascular/Lymphatic: No abdominal aortic aneurysm. There are  scattered foci of aortic and right common iliac artery atherosclerosis. Major venous structures appear patent. There are multiple right mid to lower abdominal lymph nodes, several of which are borderline enlarged, likely due to  the nearby appendicitis. No other lymph node enlargement is evident on this study. Reproductive: Prostate and seminal vesicles appear normal in size and contour. Other: The appendix is enlarged with a transverse diameter of 2.0 cm. There is mild enhancement of the appendiceal wall. There is surrounding mesenteric thickening and mild fluid. A well-defined abscess is not seen in this area. No perforation evident. There is slight ascites in the pelvis which may be of reactive etiology given the right lower quadrant inflammation. No abscess in the abdomen or pelvis evident. Musculoskeletal: There is no appreciable blastic or lytic bone lesion. There is degenerative change in the lower lumbar spine. No intramuscular or abdominal wall lesions are evident. IMPRESSION: 1.  Findings indicative of acute appendicitis. Appendix: Location: Appendix arises inferior to the cecum and is present in the upper right pelvis laterally. Diameter: 2.0 cm Appendicolith: None Mucosal hyper-enhancement: Present Extraluminal gas: None Periappendiceal collection: Extensive soft tissue stranding in this area with loculated fluid but no well-defined abscess. 2. Prominent lymph nodes in the right mid to lower abdomen, likely secondary to the acute appendicitis. 3.  Mild ascites which may be secondary to the appendicitis. 4.  No bowel obstruction.  No abscess in the abdomen or pelvis. 5.  Aortic Atherosclerosis (ICD10-I70.0). Critical Value/emergent results were called by telephone at the time of interpretation on 08/12/2019 at 4:29 pm to provider Dalia Heading, who verbally acknowledged these results. Electronically Signed   By: Lowella Grip III M.D.   On: 08/12/2019 16:30    Review of Systems  HENT: Negative  for ear discharge, ear pain, hearing loss and tinnitus.   Eyes: Negative for photophobia and pain.  Respiratory: Negative for cough and shortness of breath.   Cardiovascular: Negative for chest pain.  Gastrointestinal: Positive for abdominal pain and diarrhea. Negative for nausea and vomiting.  Genitourinary: Negative for dysuria, flank pain, frequency and urgency.  Musculoskeletal: Negative for back pain, myalgias and neck pain.  Neurological: Negative for dizziness and headaches.  Hematological: Does not bruise/bleed easily.  Psychiatric/Behavioral: The patient is not nervous/anxious.     Blood pressure (!) 147/83, pulse 83, temperature 99.4 F (37.4 C), temperature source Oral, resp. rate 14, height 5\' 11"  (1.803 m), weight 88.5 kg, SpO2 100 %. Physical Exam  Constitutional:  WDWN in NAD, conversant, no obvious deformities; lying in bed comfortably Eyes:  Pupils equal, round; sclera anicteric; moist conjunctiva; no lid lag HENT:  Oral mucosa moist; good dentition  Neck:  No masses palpated, trachea midline; no thyromegaly Lungs:  CTA bilaterally; normal respiratory effort CV:  Regular rate and rhythm; no murmurs; extremities well-perfused with no edema Abd:  +bowel sounds, soft, tender in RLQ with some guarding; no left sided- tenderness Musc:  Unable to assess gait; no apparent clubbing or cyanosis in extremities Lymphatic:  No palpable cervical or axillary lymphadenopathy Skin:  Warm, dry; no sign of jaundice Psychiatric - alert and oriented x 4; calm mood and affect  Assessment/Plan Acute appendicitis - no clear evidence of perforation or abscess, although the timeline (1 week) is concerning.  Will proceed to OR tonight after COVID test for laparoscopic appendectomy, possible open appendectomy.  The surgical procedure has been discussed with the patient.  Potential risks, benefits, alternative treatments, and expected outcomes have been explained.  All of the patient's questions  at this time have been answered.  The likelihood of reaching the patient's treatment goal is good.  The patient understand the proposed surgical procedure and wishes to proceed.   Rodman Key  Oren Section, MD 08/12/2019, 5:43 PM

## 2019-08-12 NOTE — ED Notes (Signed)
Pt transported to CT ?

## 2019-08-13 LAB — BASIC METABOLIC PANEL
Anion gap: 11 (ref 5–15)
BUN: 12 mg/dL (ref 8–23)
CO2: 24 mmol/L (ref 22–32)
Calcium: 8.7 mg/dL — ABNORMAL LOW (ref 8.9–10.3)
Chloride: 103 mmol/L (ref 98–111)
Creatinine, Ser: 0.97 mg/dL (ref 0.61–1.24)
GFR calc Af Amer: >60 mL/min (ref >60)
GFR calc non Af Amer: >60 mL/min (ref >60)
Glucose, Bld: 143 mg/dL — ABNORMAL HIGH (ref 70–99)
Potassium: 4.3 mmol/L (ref 3.5–5.1)
Sodium: 138 mmol/L (ref 135–145)

## 2019-08-13 LAB — CBC
HCT: 36.6 % — ABNORMAL LOW (ref 39.0–52.0)
Hemoglobin: 11.6 g/dL — ABNORMAL LOW (ref 13.0–17.0)
MCH: 27.7 pg (ref 26.0–34.0)
MCHC: 31.7 g/dL (ref 30.0–36.0)
MCV: 87.4 fL (ref 80.0–100.0)
Platelets: 290 10*3/uL (ref 150–400)
RBC: 4.19 MIL/uL — ABNORMAL LOW (ref 4.22–5.81)
RDW: 13.5 % (ref 11.5–15.5)
WBC: 12.7 10*3/uL — ABNORMAL HIGH (ref 4.0–10.5)
nRBC: 0 % (ref 0.0–0.2)

## 2019-08-13 NOTE — Plan of Care (Signed)

## 2019-08-13 NOTE — Progress Notes (Signed)
1 Day Post-Op   Subjective/Chief Complaint: Comfortable this morning Denies nausea Tolerated clears No flatus yet   Objective: Vital signs in last 24 hours: Temp:  [98.1 F (36.7 C)-99.6 F (37.6 C)] 98.1 F (36.7 C) (04/28 0802) Pulse Rate:  [69-90] 85 (04/28 0802) Resp:  [14-24] 17 (04/28 0802) BP: (111-147)/(68-83) 111/69 (04/28 0802) SpO2:  [92 %-100 %] 97 % (04/28 0802) Weight:  [88.5 kg] 88.5 kg (04/27 0950) Last BM Date: 08/11/19  Intake/Output from previous day: 04/27 0701 - 04/28 0700 In: 2736.3 [I.V.:1436.3; IV Piggyback:1300] Out: 670 [Urine:650; Blood:20] Intake/Output this shift: No intake/output data recorded.  Exam: Awake and alert Appears comfortable Abdomen soft, appropriately tender, incisions clean  Lab Results:  Recent Labs    08/12/19 0955 08/13/19 0151  WBC 10.8* 12.7*  HGB 12.0* 11.6*  HCT 38.6* 36.6*  PLT 308 290   BMET Recent Labs    08/12/19 0955 08/13/19 0151  NA 137 138  K 3.8 4.3  CL 103 103  CO2 25 24  GLUCOSE 112* 143*  BUN 12 12  CREATININE 1.04 0.97  CALCIUM 9.1 8.7*   PT/INR No results for input(s): LABPROT, INR in the last 72 hours. ABG No results for input(s): PHART, HCO3 in the last 72 hours.  Invalid input(s): PCO2, PO2  Studies/Results: CT Abdomen Pelvis W Contrast  Result Date: 08/12/2019 CLINICAL DATA:  Right lower abdominal pain and fever EXAM: CT ABDOMEN AND PELVIS WITH CONTRAST TECHNIQUE: Multidetector CT imaging of the abdomen and pelvis was performed using the standard protocol following bolus administration of intravenous contrast. CONTRAST:  152mL OMNIPAQUE IOHEXOL 300 MG/ML  SOLN COMPARISON:  None. FINDINGS: Lower chest: There is bibasilar atelectasis. No lung base edema or airspace opacity. Hepatobiliary: There is an apparent cyst in the posterior segment right lobe of the liver inferiorly measuring 1.0 x 0.9 cm. There are several 3-4 mm foci of decreased attenuation in the liver, probably  representing small cysts. No other liver lesions are evident. Gallbladder wall is not appreciably thickened. There is no evident biliary duct dilatation. Pancreas: There is no evident pancreatic mass or inflammatory focus. Spleen: No splenic lesions are evident. Adrenals/Urinary Tract: Adrenals bilaterally appear unremarkable. Kidneys bilaterally show no evident mass or hydronephrosis on either side. There is no appreciable renal or ureteral calculus on either side. Urinary bladder is midline with wall thickness within normal limits. Stomach/Bowel: There is no appreciable bowel wall or mesenteric thickening. No bowel obstruction evident. The terminal ileum appears unremarkable. No free air or portal venous air. Vascular/Lymphatic: No abdominal aortic aneurysm. There are scattered foci of aortic and right common iliac artery atherosclerosis. Major venous structures appear patent. There are multiple right mid to lower abdominal lymph nodes, several of which are borderline enlarged, likely due to the nearby appendicitis. No other lymph node enlargement is evident on this study. Reproductive: Prostate and seminal vesicles appear normal in size and contour. Other: The appendix is enlarged with a transverse diameter of 2.0 cm. There is mild enhancement of the appendiceal wall. There is surrounding mesenteric thickening and mild fluid. A well-defined abscess is not seen in this area. No perforation evident. There is slight ascites in the pelvis which may be of reactive etiology given the right lower quadrant inflammation. No abscess in the abdomen or pelvis evident. Musculoskeletal: There is no appreciable blastic or lytic bone lesion. There is degenerative change in the lower lumbar spine. No intramuscular or abdominal wall lesions are evident. IMPRESSION: 1.  Findings indicative of acute  appendicitis. Appendix: Location: Appendix arises inferior to the cecum and is present in the upper right pelvis laterally. Diameter:  2.0 cm Appendicolith: None Mucosal hyper-enhancement: Present Extraluminal gas: None Periappendiceal collection: Extensive soft tissue stranding in this area with loculated fluid but no well-defined abscess. 2. Prominent lymph nodes in the right mid to lower abdomen, likely secondary to the acute appendicitis. 3.  Mild ascites which may be secondary to the appendicitis. 4.  No bowel obstruction.  No abscess in the abdomen or pelvis. 5.  Aortic Atherosclerosis (ICD10-I70.0). Critical Value/emergent results were called by telephone at the time of interpretation on 08/12/2019 at 4:29 pm to provider Dalia Heading, who verbally acknowledged these results. Electronically Signed   By: Lowella Grip III M.D.   On: 08/12/2019 16:30    Anti-infectives: Anti-infectives (From admission, onward)   Start     Dose/Rate Route Frequency Ordered Stop   08/13/19 1700  cefTRIAXone (ROCEPHIN) 2 g in sodium chloride 0.9 % 100 mL IVPB     2 g 200 mL/hr over 30 Minutes Intravenous Every 24 hours 08/12/19 2222     08/12/19 2300  metroNIDAZOLE (FLAGYL) IVPB 500 mg     500 mg 100 mL/hr over 60 Minutes Intravenous Every 8 hours 08/12/19 2222     08/12/19 1645  cefTRIAXone (ROCEPHIN) 2 g in sodium chloride 0.9 % 100 mL IVPB     2 g 200 mL/hr over 30 Minutes Intravenous  Once 08/12/19 1636 08/12/19 1800   08/12/19 1645  metroNIDAZOLE (FLAGYL) IVPB 500 mg     500 mg 100 mL/hr over 60 Minutes Intravenous  Once 08/12/19 1636 08/12/19 1804      Assessment/Plan: s/p Procedure(s): APPENDECTOMY LAPAROSCOPIC (N/A)  Perforated appendicitis with abscess  POD#1  Continue IV antibiotics Decrease IVF Advance to full liquids Hopefully home in the next 24 to 48 hours  LOS: 1 day    Coralie Keens 08/13/2019

## 2019-08-13 NOTE — Progress Notes (Signed)
Patient up out of bed and ambulated to the chair- tolerated well.  No flatus yet

## 2019-08-13 NOTE — Progress Notes (Signed)
Patient is ambulating in the hallway- tolerates well

## 2019-08-14 ENCOUNTER — Inpatient Hospital Stay (HOSPITAL_COMMUNITY): Payer: Medicare HMO

## 2019-08-14 LAB — BASIC METABOLIC PANEL
Anion gap: 9 (ref 5–15)
Anion gap: 10 (ref 5–15)
BUN: 12 mg/dL (ref 8–23)
BUN: 16 mg/dL (ref 8–23)
CO2: 23 mmol/L (ref 22–32)
CO2: 24 mmol/L (ref 22–32)
Calcium: 8.4 mg/dL — ABNORMAL LOW (ref 8.9–10.3)
Calcium: 8.6 mg/dL — ABNORMAL LOW (ref 8.9–10.3)
Chloride: 105 mmol/L (ref 98–111)
Chloride: 100 mmol/L (ref 98–111)
Creatinine, Ser: 1.11 mg/dL (ref 0.61–1.24)
Creatinine, Ser: 1.07 mg/dL (ref 0.61–1.24)
GFR calc Af Amer: >60 mL/min (ref >60)
GFR calc Af Amer: >60 mL/min (ref >60)
GFR calc non Af Amer: >60 mL/min (ref >60)
GFR calc non Af Amer: >60 mL/min (ref >60)
Glucose, Bld: 136 mg/dL — ABNORMAL HIGH (ref 70–99)
Glucose, Bld: 140 mg/dL — ABNORMAL HIGH (ref 70–99)
Potassium: 4 mmol/L (ref 3.5–5.1)
Potassium: 4 mmol/L (ref 3.5–5.1)
Sodium: 137 mmol/L (ref 135–145)
Sodium: 134 mmol/L — ABNORMAL LOW (ref 135–145)

## 2019-08-14 LAB — CBC
HCT: 36.9 % — ABNORMAL LOW (ref 39.0–52.0)
Hemoglobin: 11.8 g/dL — ABNORMAL LOW (ref 13.0–17.0)
MCH: 27.5 pg (ref 26.0–34.0)
MCHC: 32 g/dL (ref 30.0–36.0)
MCV: 86 fL (ref 80.0–100.0)
Platelets: 321 10*3/uL (ref 150–400)
RBC: 4.29 MIL/uL (ref 4.22–5.81)
RDW: 13.5 % (ref 11.5–15.5)
WBC: 12.7 10*3/uL — ABNORMAL HIGH (ref 4.0–10.5)
nRBC: 0 % (ref 0.0–0.2)

## 2019-08-14 LAB — SURGICAL PATHOLOGY

## 2019-08-14 MED ORDER — SODIUM CHLORIDE 0.9% FLUSH
10.0000 mL | INTRAVENOUS | Status: DC | PRN
  Administered 2019-08-14: 10 mL

## 2019-08-14 MED ORDER — PROMETHAZINE HCL 25 MG/ML IJ SOLN: 12.5000 mg | Freq: Four times a day (QID) | INTRAMUSCULAR | Status: DC | PRN

## 2019-08-14 MED ORDER — OXYCODONE HCL 5 MG PO TABS: 5.0000 mg | ORAL_TABLET | ORAL | Status: DC | PRN

## 2019-08-14 MED ORDER — SODIUM CHLORIDE 0.9% FLUSH
10.0000 mL | Freq: Two times a day (BID) | INTRAVENOUS | Status: DC
  Administered 2019-08-16 – 2019-08-27 (×9): 10 mL

## 2019-08-14 MED ORDER — MORPHINE SULFATE (PF) 2 MG/ML IV SOLN: 2.0000 mg | INTRAVENOUS | Status: DC | PRN

## 2019-08-14 NOTE — Progress Notes (Signed)
Pt had 1 occurrence of emesis; bowel sounds present, reports no more feelings of nausea. Pt full liquid breakfast tray is on its way. will continue to monitor.

## 2019-08-14 NOTE — Progress Notes (Signed)
2 Days Post-Op  Subjective: CC: Nausea, emesis Patient reports that yesterday he was doing well and tolerating full liquid diet.  He did have some pain around his umbilical incision that he rated as a 6/10 and constant.  This resolved overnight. He reports no pain this AM. This morning he has felt more distended in his abdomen, full and nauseated.  He did have an episode of emesis around 8 AM this morning.  None since.  He did not have much of an appetite or eat but a few bites of his FL D tray.  He reports no flatus or BM since surgery.  He is mobilizing without difficulty.  He is urinating without difficulty.  Objective: Vital signs in last 24 hours: Temp:  [98.7 F (37.1 C)-99.8 F (37.7 C)] 98.7 F (37.1 C) (04/29 0749) Pulse Rate:  [83-108] 93 (04/29 0749) Resp:  [17-19] 17 (04/29 0749) BP: (115-139)/(69-81) 139/81 (04/29 0749) SpO2:  [90 %-95 %] 92 % (04/29 0749) Last BM Date: 08/11/19  Intake/Output from previous day: 04/28 0701 - 04/29 0700 In: 2139.8 [I.V.:1683.1; IV Piggyback:456.7] Out: 1150 [Urine:1150] Intake/Output this shift: No intake/output data recorded.  PE: Gen:  Alert, NAD, pleasant Card:  RRR, no M/G/R heard Pulm:  CTAB, no W/R/R, effort normal Abd: Soft, moderate distension, mild tenderness of the right lower quadrant without rigidity or guarding. Otherwise NT.  Laparoscopic incisions with gauze and Tegaderm over, clean, dry and intact.  Hypoactive bowel sounds with intermittent high-pitched bowel sounds.   Ext:  No LE edema  Psych: A&Ox3  Skin: no rashes noted, warm and dry  Lab Results:  Recent Labs    08/13/19 0151 08/14/19 0201  WBC 12.7* 12.7*  HGB 11.6* 11.8*  HCT 36.6* 36.9*  PLT 290 321   BMET Recent Labs    08/13/19 0151 08/14/19 0201  NA 138 137  K 4.3 4.0  CL 103 105  CO2 24 23  GLUCOSE 143* 136*  BUN 12 12  CREATININE 0.97 1.11  CALCIUM 8.7* 8.4*   PT/INR No results for input(s): LABPROT, INR in the last 72  hours. CMP     Component Value Date/Time   NA 137 08/14/2019 0201   K 4.0 08/14/2019 0201   CL 105 08/14/2019 0201   CO2 23 08/14/2019 0201   GLUCOSE 136 (H) 08/14/2019 0201   GLUCOSE 82 03/22/2006 0834   BUN 12 08/14/2019 0201   CREATININE 1.11 08/14/2019 0201   CALCIUM 8.4 (L) 08/14/2019 0201   PROT 6.8 08/12/2019 0955   ALBUMIN 3.3 (L) 08/12/2019 0955   AST 26 08/12/2019 0955   ALT 34 08/12/2019 0955   ALKPHOS 79 08/12/2019 0955   BILITOT 0.8 08/12/2019 0955   GFRNONAA >60 08/14/2019 0201   GFRAA >60 08/14/2019 0201   Lipase     Component Value Date/Time   LIPASE 23 08/12/2019 0955       Studies/Results: CT Abdomen Pelvis W Contrast  Result Date: 08/12/2019 CLINICAL DATA:  Right lower abdominal pain and fever EXAM: CT ABDOMEN AND PELVIS WITH CONTRAST TECHNIQUE: Multidetector CT imaging of the abdomen and pelvis was performed using the standard protocol following bolus administration of intravenous contrast. CONTRAST:  153mL OMNIPAQUE IOHEXOL 300 MG/ML  SOLN COMPARISON:  None. FINDINGS: Lower chest: There is bibasilar atelectasis. No lung base edema or airspace opacity. Hepatobiliary: There is an apparent cyst in the posterior segment right lobe of the liver inferiorly measuring 1.0 x 0.9 cm. There are several 3-4 mm foci of  decreased attenuation in the liver, probably representing small cysts. No other liver lesions are evident. Gallbladder wall is not appreciably thickened. There is no evident biliary duct dilatation. Pancreas: There is no evident pancreatic mass or inflammatory focus. Spleen: No splenic lesions are evident. Adrenals/Urinary Tract: Adrenals bilaterally appear unremarkable. Kidneys bilaterally show no evident mass or hydronephrosis on either side. There is no appreciable renal or ureteral calculus on either side. Urinary bladder is midline with wall thickness within normal limits. Stomach/Bowel: There is no appreciable bowel wall or mesenteric thickening. No  bowel obstruction evident. The terminal ileum appears unremarkable. No free air or portal venous air. Vascular/Lymphatic: No abdominal aortic aneurysm. There are scattered foci of aortic and right common iliac artery atherosclerosis. Major venous structures appear patent. There are multiple right mid to lower abdominal lymph nodes, several of which are borderline enlarged, likely due to the nearby appendicitis. No other lymph node enlargement is evident on this study. Reproductive: Prostate and seminal vesicles appear normal in size and contour. Other: The appendix is enlarged with a transverse diameter of 2.0 cm. There is mild enhancement of the appendiceal wall. There is surrounding mesenteric thickening and mild fluid. A well-defined abscess is not seen in this area. No perforation evident. There is slight ascites in the pelvis which may be of reactive etiology given the right lower quadrant inflammation. No abscess in the abdomen or pelvis evident. Musculoskeletal: There is no appreciable blastic or lytic bone lesion. There is degenerative change in the lower lumbar spine. No intramuscular or abdominal wall lesions are evident. IMPRESSION: 1.  Findings indicative of acute appendicitis. Appendix: Location: Appendix arises inferior to the cecum and is present in the upper right pelvis laterally. Diameter: 2.0 cm Appendicolith: None Mucosal hyper-enhancement: Present Extraluminal gas: None Periappendiceal collection: Extensive soft tissue stranding in this area with loculated fluid but no well-defined abscess. 2. Prominent lymph nodes in the right mid to lower abdomen, likely secondary to the acute appendicitis. 3.  Mild ascites which may be secondary to the appendicitis. 4.  No bowel obstruction.  No abscess in the abdomen or pelvis. 5.  Aortic Atherosclerosis (ICD10-I70.0). Critical Value/emergent results were called by telephone at the time of interpretation on 08/12/2019 at 4:29 pm to provider Dalia Heading, who verbally acknowledged these results. Electronically Signed   By: Lowella Grip III M.D.   On: 08/12/2019 16:30    Anti-infectives: Anti-infectives (From admission, onward)   Start     Dose/Rate Route Frequency Ordered Stop   08/13/19 1700  cefTRIAXone (ROCEPHIN) 2 g in sodium chloride 0.9 % 100 mL IVPB     2 g 200 mL/hr over 30 Minutes Intravenous Every 24 hours 08/12/19 2222     08/12/19 2300  metroNIDAZOLE (FLAGYL) IVPB 500 mg     500 mg 100 mL/hr over 60 Minutes Intravenous Every 8 hours 08/12/19 2222     08/12/19 1645  cefTRIAXone (ROCEPHIN) 2 g in sodium chloride 0.9 % 100 mL IVPB     2 g 200 mL/hr over 30 Minutes Intravenous  Once 08/12/19 1636 08/12/19 1800   08/12/19 1645  metroNIDAZOLE (FLAGYL) IVPB 500 mg     500 mg 100 mL/hr over 60 Minutes Intravenous  Once 08/12/19 1636 08/12/19 1804       Assessment/Plan Perforated acute appendicitis with peritoneal abscess S/p laparoscopic appendectomy, Dr. Georgette Dover, 08/12/2019, POD #2 -Patient appears to be developing ileus.  Will check plain film of the abdomen and back down to clear liquids -Continue antibiotics -Mobilize -  Pulmonary toilet, incentive spirometer - Check AM labs and electrolytes   FEN - CLD VTE - SCDs, Lovenox ID - Rocephin/Flagyl 4/27 >> WBC stable at 12.7   LOS: 2 days    Joel Lopez , [facility] 08/14/2019, 10:20 AM Please see Amion for pager number during day hours 7:00am-4:30pm

## 2019-08-14 NOTE — Plan of Care (Signed)

## 2019-08-14 NOTE — Discharge Instructions (Addendum)
[facility]  Please arrive at least 30 min before your appointment to complete your check in paperwork.  If you are unable to arrive 30 min prior to your appointment time we may have to cancel or reschedule you. LAPAROSCOPIC SURGERY: POST OP INSTRUCTIONS Always review your discharge instruction sheet given to you by the facility where your surgery was performed. IF YOU HAVE DISABILITY OR FAMILY LEAVE FORMS, YOU MUST BRING THEM TO THE OFFICE FOR PROCESSING.   DO NOT GIVE THEM TO YOUR DOCTOR.  PAIN CONTROL  1. First take acetaminophen (Tylenol) AND/or ibuprofen (Advil) to control your pain after surgery.  Follow directions on package.  Taking acetaminophen (Tylenol) and/or ibuprofen (Advil) regularly after surgery will help to control your pain and lower the amount of prescription pain medication you may need.  You should not take more than 4,000 mg (4 grams) of acetaminophen (Tylenol) in 24 hours.  You should not take ibuprofen (Advil), aleve, motrin, naprosyn or other NSAIDS if you have a history of stomach ulcers or chronic kidney disease.  2. A prescription for pain medication may be given to you upon discharge.  Take your pain medication as prescribed, if you still have uncontrolled pain after taking acetaminophen (Tylenol) or ibuprofen (Advil). 3. Use ice packs to help control pain. 4. If you need a refill on your pain medication, please contact your pharmacy.  They will contact our office to request authorization. Prescriptions will not be filled after 5pm or on week-ends.  HOME MEDICATIONS 5. Take your usually prescribed medications unless otherwise directed.  DIET 6. You should follow a light diet the first few days after arrival home.  Be sure to include lots of fluids daily. Avoid fatty, fried foods.   CONSTIPATION 7. It is common to experience some constipation after surgery and if you are taking pain medication.  Increasing fluid intake and taking a stool  softener (such as Colace) will usually help or prevent this problem from occurring.  A mild laxative (Milk of Magnesia or Miralax) should be taken according to package instructions if there are no bowel movements after 48 hours.  WOUND/INCISION CARE 8. Most patients will experience some swelling and bruising in the area of the incisions.  Ice packs will help.  Swelling and bruising can take several days to resolve.  9. Unless discharge instructions indicate otherwise, follow guidelines below  a. STERI-STRIPS - you may remove your outer bandages 48 hours after surgery, and you may shower at that time.  You have steri-strips (small skin tapes) in place directly over the incision.  These strips should be left on the skin for 7-10 days.   b. DERMABOND/SKIN GLUE - you may shower in 24 hours.  The glue will flake off over the next 2-3 weeks. 10. Any sutures or staples will be removed at the office during your follow-up visit.  ACTIVITIES 11. You may resume regular (light) daily activities beginning the next day--such as daily self-care, walking, climbing stairs--gradually increasing activities as tolerated.  You may have sexual intercourse when it is comfortable.  Refrain from any heavy lifting or straining until approved by your doctor. a. You may drive when you are no longer taking prescription pain medication, you can comfortably wear a seatbelt, and you can safely maneuver your car and apply brakes.  FOLLOW-UP 12. You should see your doctor in the office for a follow-up appointment approximately 2-3 weeks after your surgery.  You should have been given your post-op/follow-up appointment when   your surgery was scheduled.  If you did not receive a post-op/follow-up appointment, make sure that you call for this appointment within a day or two after you arrive home to insure a convenient appointment time.   WHEN TO CALL YOUR DOCTOR: 1. Fever over 101.0 2. Inability to urinate 3. Continued bleeding from  incision. 4. Increased pain, redness, or drainage from the incision. 5. Increasing abdominal pain  The clinic staff is available to answer your questions during regular business hours.  Please don't hesitate to call and ask to speak to one of the nurses for clinical concerns.  If you have a medical emergency, go to the nearest emergency room or call 911.  A surgeon from [facility] is always on call at the hospital. [address], [place], [place], [place]  [address] ? [address], [place], [place]   [phone] ? [phone] ? FAX [phone]   Deep Vein Thrombosis  Deep vein thrombosis (DVT) is a condition in which a blood clot forms in a deep vein, such as a lower leg, thigh, or arm vein. A clot is blood that has thickened into a gel or solid. This condition is dangerous. It can lead to serious and even life-threatening complications if the clot travels to the lungs and causes a blockage (pulmonary embolism). It can also damage veins in the leg. This can result in leg pain, swelling, discoloration, and sores (post-thrombotic syndrome). What are the causes? This condition may be caused by:  A slowdown of blood flow.  Damage to a vein.  A condition that causes blood to clot more easily, such as an inherited clotting disorder. What increases the risk? The following factors may make you more likely to develop this condition:  Being overweight.  Being older, especially over age 67.  Sitting or lying down for more than four hours.  Being in the hospital.  Lack of physical activity (sedentary lifestyle).  Pregnancy, being in childbirth, or having recently given birth.  Taking medicines that contain estrogen, such as medicines to prevent pregnancy.  Smoking.  A history of any of the following: ? Blood clots or a blood clotting disease. ? Peripheral vascular disease. ? Inflammatory bowel disease. ? Cancer. ? Heart disease. ? Genetic  conditions that affect how your blood clots, such as Factor V Leiden mutation. ? Neurological diseases that affect your legs (leg paresis). ? A recent injury, such as a car accident. ? Major or lengthy surgery. ? A central line placed inside a large vein. What are the signs or symptoms? Symptoms of this condition include:  Swelling, pain, or tenderness in an arm or leg.  Warmth, redness, or discoloration in an arm or leg. If the clot is in your leg, symptoms may be more noticeable or worse when you stand or walk. Some people may not develop any symptoms. How is this diagnosed? This condition is diagnosed with:  A medical history and physical exam.  Tests, such as: ? Blood tests. These are done to check how well your blood clots. ? Ultrasound. This is done to check for clots. ? Venogram. For this test, contrast dye is injected into a vein and X-rays are taken to check for any clots. How is this treated? Treatment for this condition depends on:  The cause of your DVT.  Your risk for bleeding or developing more clots.  Any other medical conditions that you have. Treatment may include:  Taking a blood thinner (anticoagulant). This type of  medicine prevents clots from forming. It may be taken by mouth, injected under the skin, or injected through an IV (catheter).  Injecting clot-dissolving medicines into the affected vein (catheter-directed thrombolysis).  Having surgery. Surgery may be done to: ? Remove the clot. ? Place a filter in a large vein to catch blood clots before they reach the lungs. Some treatments may be continued for up to six months. Follow these instructions at home: If you are taking blood thinners:  Take the medicine exactly as told by your health care provider. Some blood thinners need to be taken at the same time every day. Do not skip a dose.  Talk with your health care provider before you take any medicines that contain aspirin or NSAIDs. These medicines  increase your risk for dangerous bleeding.  Ask your health care provider about foods and drugs that could change the way the medicine works (may interact). Avoid those things if your health care provider tells you to do so.  Blood thinners can cause easy bruising and may make it difficult to stop bleeding. Because of this: ? Be very careful when using knives, scissors, or other sharp objects. ? Use an electric razor instead of a blade. ? Avoid activities that could cause injury or bruising, and follow instructions about how to prevent falls.  Wear a medical alert bracelet or carry a card that lists what medicines you take. General instructions  Take over-the-counter and prescription medicines only as told by your health care provider.  Return to your normal activities as told by your health care provider. Ask your health care provider what activities are safe for you.  Wear compression stockings if recommended by your health care provider.  Keep all follow-up visits as told by your health care provider. This is important. How is this prevented? To lower your risk of developing this condition again:  For 30 or more minutes every day, do an activity that: ? Involves moving your arms and legs. ? Increases your heart rate.  When traveling for longer than four hours: ? Exercise your arms and legs every hour. ? Drink plenty of water. ? Avoid drinking alcohol.  Avoid sitting or lying for a long time without moving your legs.  If you have surgery or you are hospitalized, ask about ways to prevent blood clots. These may include taking frequent walks or using anticoagulants.  Stay at a healthy weight.  If you are a woman who is older than age 6, avoid unnecessary use of medicines that contain estrogen, such as some birth control pills.  Do not use any products that contain nicotine or tobacco, such as cigarettes and e-cigarettes. This is especially important if you take estrogen  medicines. If you need help quitting, ask your health care provider. Contact a health care provider if:  You miss a dose of your blood thinner.  Your menstrual period is heavier than usual.  You have unusual bruising. Get help right away if:  You have: ? New or increased pain, swelling, or redness in an arm or leg. ? Numbness or tingling in an arm or leg. ? Shortness of breath. ? Chest pain. ? A rapid or irregular heartbeat. ? A severe headache or confusion. ? A cut that will not stop bleeding.  There is blood in your vomit, stool, or urine.  You have a serious fall or accident, or you hit your head.  You feel light-headed or dizzy.  You cough up blood. These symptoms may represent a  serious problem that is an emergency. Do not wait to see if the symptoms will go away. Get medical help right away. Call your local emergency services (911 in the [place] Do not drive yourself to the hospital. Summary  Deep vein thrombosis (DVT) is a condition in which a blood clot forms in a deep vein, such as a lower leg, thigh, or arm vein.  Symptoms can include swelling, warmth, pain, and redness in your leg or arm.  This condition may be treated with a blood thinner (anticoagulant medicine), medicine that is injected to dissolve blood clots,compression stockings, or surgery.  If you are prescribed blood thinners, take them exactly as told. This information is not intended to replace advice given to you by your health care provider. Make sure you discuss any questions you have with your health care provider. Document Revised: 03/16/2017 Document Reviewed: 09/01/2016 Elsevier Patient Education  [place].   Rivaroxaban ([place]) ED Discharge Instructions   Patient received a prescription for  Xarelto 15 & 20 mg - 51 tablet VTE STARTER PACK.   Patient understands only the FIRST 30 DAYS OF TREATMENT  will be provided by the starter pack.   Patient understands to contact primary  care doctor or ED immediately if for any reason is unable to fill the starter pack prescription.  Patient must schedule a follow-up appointment with primary care doctor within 15 days of discharge in order to receive the maintenance prescription and clinical follow up.  Patient has received an education kit containing ([facility] Trial Offer Card, DVT/PE brochure, Dosing Diary, and Xarelto Medication Guide).   If not performed in the ED, patient will receive medication counseling by a [facility] pharmacist via phone follow-up within the next 72 hours. Pharmacist to review signs and symptoms of bleeding and proper use of this medication.   Call 911 or return immediately to the nearest ED if you develop bleeding (e.g. nose, gums, vomit, urine, bloody or dark stools), unusual bruising, head trauma (even if minor), severe headache, altered mental status, change in speech, weakness on one side of body, shortness of breath, swollen lips/tongue/face/neck, chest pain, or other concerns.    Information on my medicine - XARELTO (rivaroxaban)  This medication education was provided to me or my healthcare representative as part of my discharge instructions.   WHY WAS XARELTO PRESCRIBED FOR YOU?  Xarelto was prescribed to treat blood clots that may have been found in the veins of your legs (deep vein thrombosis) or in your lungs (pulmonary embolism) and to reduce the risk of them occurring again.   WHAT DO YOU NEED TO KNOW ABOUT XARELTO?  The starting dose is one 15 mg tablet taken TWICE daily with food for the FIRST 21 DAYS then on day 22 the dose is changed to one 20 mg tablet taken ONCE A DAY with your evening meal.   DO NOT stop taking Xarelto without talking to the health care provider who prescribed the medication. Refill your prescription for 20 mg tablets before you run out.  After discharge, you should have regular check-up appointments with your healthcare provider that is prescribing your  Xarelto. In the future your dose may need to be changed if your kidney function changes by a significant amount.   WHAT DO YOU DO IF YOU MISS A DOSE?  If you are taking Xarelto TWICE DAILY and you miss a dose, take it as soon as you remember. You may take two 15 mg tablets (total 30 mg)  at the same time then resume your regularly scheduled 15 mg twice daily the next day.   If you are taking Xarelto ONCE DAILY and you miss a dose, take it as soon as you remember on the same day then continue your regularly scheduled once daily regimen the next day. Do not take two doses of Xarelto at the same time.   IMPORTANT SAFETY INFORMATION  Xarelto is a blood thinner medicine that can cause bleeding. You should call your healthcare provider right away if you experience any of the following:  -  Bleeding from an injury or your nose that does not stop.  -  Unusual colored urine (red or dark brown) or unusual colored stools (red or black).  -  Unusual bruising for unknown reasons.  -  A serious fall or if you hit your head (even if there is no bleeding).   Some medicines may interact with Xarelto and might increase your risk of bleeding while on Xarelto. To help avoid this, consult your healthcare provider or pharmacist prior to using any new prescription or non-prescription medications, including herbals, vitamins, non-steroidal anti-inflammatory drugs (NSAIDs) and supplements.   This website has more information on Xarelto: [email or web address]  Rivaroxaban oral tablets What is this medicine? RIVAROXABAN (ri va ROX a ban) is an anticoagulant (blood thinner). It is used to treat blood clots in the lungs or in the veins. It is also used to prevent blood clots in the lungs or in the veins. It is also used to lower the chance of stroke in people with a medical condition called atrial fibrillation. This medicine may be used for other purposes; ask your health care provider or pharmacist if you have  questions. COMMON BRAND NAME(S): Xarelto, Xarelto Starter Pack What should I tell my health care provider before I take this medicine? They need to know if you have any of these conditions:  antiphospholipid antibody syndrome  artificial heart valve  bleeding disorders  bleeding in the brain  blood in your stools (black or tarry stools) or if you have blood in your vomit  history of blood clots  history of stomach bleeding  kidney disease  liver disease  low blood counts, like low white cell, platelet, or red cell counts  recent or planned spinal or epidural procedure  take medicines that treat or prevent blood clots  an unusual or allergic reaction to rivaroxaban, other medicines, foods, dyes, or preservatives  pregnant or trying to get pregnant  breast-feeding How should I use this medicine? Take this medicine by mouth with a glass of water. Follow the directions on the prescription label. Take your medicine at regular intervals. Do not take it more often than directed. Do not stop taking except on your doctor's advice. Stopping this medicine may increase your risk of a blood clot. Be sure to refill your prescription before you run out of medicine. If you are taking this medicine after hip or knee replacement surgery, take it with or without food. If you are taking this medicine for atrial fibrillation, take it with your evening meal. If you are taking this medicine to treat blood clots, take it with food at the same time each day. If you are unable to swallow your tablet, you may crush the tablet and mix it in applesauce. Then, immediately eat the applesauce. You should eat more food right after you eat the applesauce containing the crushed tablet. Talk to your pediatrician regarding the use of this medicine in children. Special  care may be needed. Overdosage: If you think you have taken too much of this medicine contact a poison control center or emergency room at  once. NOTE: This medicine is only for you. Do not share this medicine with others. What if I miss a dose? If you take your medicine once a day and miss a dose, take the missed dose as soon as you remember. If it is almost time for your next dose, take only that dose. Do not take double or extra doses. If you take your medicine twice a day and miss a dose, take the missed dose immediately. In this instance, 2 tablets may be taken at the same time. The next day you should take 1 tablet twice a day as directed. What may interact with this medicine? Do not take this medicine with any of the following medications:  defibrotide This medicine may also interact with the following medications:  aspirin and aspirin-like medicines  certain antibiotics like erythromycin, azithromycin, and clarithromycin  certain medicines for fungal infections like ketoconazole and itraconazole  certain medicines for irregular heart beat like amiodarone, quinidine, dronedarone  certain medicines for seizures like carbamazepine, phenytoin  certain medicines that treat or prevent blood clots like warfarin, enoxaparin, and dalteparin  conivaptan  felodipine  indinavir  lopinavir; ritonavir  NSAIDS, medicines for pain and inflammation, like ibuprofen or naproxen  ranolazine  rifampin  ritonavir  SNRIs, medicines for depression, like desvenlafaxine, duloxetine, levomilnacipran, venlafaxine  SSRIs, medicines for depression, like citalopram, escitalopram, fluoxetine, fluvoxamine, paroxetine, sertraline  St. John's wort  verapamil This list may not describe all possible interactions. Give your health care provider a list of all the medicines, herbs, non-prescription drugs, or dietary supplements you use. Also tell them if you smoke, drink alcohol, or use illegal drugs. Some items may interact with your medicine. What should I watch for while using this medicine? Visit your healthcare professional for  regular checks on your progress. You may need blood work done while you are taking this medicine. Your condition will be monitored carefully while you are receiving this medicine. It is important not to miss any appointments. Avoid sports and activities that might cause injury while you are using this medicine. Severe falls or injuries can cause unseen bleeding. Be careful when using sharp tools or knives. Consider using an Copy. Take special care brushing or flossing your teeth. Report any injuries, bruising, or red spots on the skin to your healthcare professional. If you are going to need surgery or other procedure, tell your healthcare professional that you are taking this medicine. Wear a medical ID bracelet or chain. Carry a card that describes your disease and details of your medicine and dosage times. What side effects may I notice from receiving this medicine? Side effects that you should report to your doctor or health care professional as soon as possible:  allergic reactions like skin rash, itching or hives, swelling of the face, lips, or tongue  back pain  redness, blistering, peeling or loosening of the skin, including inside the mouth  signs and symptoms of bleeding such as bloody or black, tarry stools; red or dark-brown urine; spitting up blood or brown material that looks like coffee grounds; red spots on the skin; unusual bruising or bleeding from the eye, gums, or nose  signs and symptoms of a blood clot such as chest pain; shortness of breath; pain, swelling, or warmth in the leg  signs and symptoms of a stroke such as changes in  vision; confusion; trouble speaking or understanding; severe headaches; sudden numbness or weakness of the face, arm or leg; trouble walking; dizziness; loss of coordination Side effects that usually do not require medical attention (report to your doctor or health care professional if they continue or are bothersome):  dizziness  muscle  pain This list may not describe all possible side effects. Call your doctor for medical advice about side effects. You may report side effects to FDA at 1-800-FDA-1088. Where should I keep my medicine? Keep out of the reach of children. Store at room temperature between 15 and 30 degrees C (59 and 86 degrees F). Throw away any unused medicine after the expiration date. NOTE: This sheet is a summary. It may not cover all possible information. If you have questions about this medicine, talk to your doctor, pharmacist, or health care provider.  2020 Elsevier/Gold Standard (2018-07-01 09:45:59)

## 2019-08-15 ENCOUNTER — Inpatient Hospital Stay (HOSPITAL_COMMUNITY): Payer: Medicare HMO

## 2019-08-15 LAB — CBC
HCT: 39.3 % (ref 39.0–52.0)
Hemoglobin: 12.7 g/dL — ABNORMAL LOW (ref 13.0–17.0)
MCH: 27.5 pg (ref 26.0–34.0)
MCHC: 32.3 g/dL (ref 30.0–36.0)
MCV: 85.1 fL (ref 80.0–100.0)
Platelets: 387 10*3/uL (ref 150–400)
RBC: 4.62 MIL/uL (ref 4.22–5.81)
RDW: 13.6 % (ref 11.5–15.5)
WBC: 16 10*3/uL — ABNORMAL HIGH (ref 4.0–10.5)
nRBC: 0 % (ref 0.0–0.2)

## 2019-08-15 LAB — MAGNESIUM: Magnesium: 1.9 mg/dL (ref 1.7–2.4)

## 2019-08-15 MED ORDER — BISACODYL 10 MG RE SUPP
10.0000 mg | Freq: Once | RECTAL | Status: AC
  Administered 2019-08-15: 10 mg via RECTAL
  Filled 2019-08-15: qty 1

## 2019-08-15 MED ORDER — PIPERACILLIN-TAZOBACTAM 3.375 G IVPB
3.3750 g | Freq: Three times a day (TID) | INTRAVENOUS | Status: DC
  Administered 2019-08-15 – 2019-08-22 (×21): 3.375 g via INTRAVENOUS
  Filled 2019-08-15 (×26): qty 50

## 2019-08-15 MED ORDER — METOCLOPRAMIDE HCL 5 MG/ML IJ SOLN
10.0000 mg | Freq: Three times a day (TID) | INTRAMUSCULAR | Status: AC
  Administered 2019-08-15 – 2019-08-16 (×3): 10 mg via INTRAVENOUS
  Filled 2019-08-15 (×3): qty 2

## 2019-08-15 NOTE — Progress Notes (Signed)
Lab called. Blood clotted. Needs to be redrawn

## 2019-08-15 NOTE — Progress Notes (Signed)
3 Days Post-Op  Subjective: CC: Patient reports that he feels generalized abdominal pressure, bloating as well as nausea and sensation that he is going to vomit.  He notes that his NG tube output out anything this morning.  He did pass a small episode of flatus around 615 this morning.  No BM.  Continues to have mild, dull pain in the right lower quadrant.  He is mobilizing his room without difficulty.  Objective: Vital signs in last 24 hours: Temp:  [99.4 F (37.4 C)-100.4 F (38 C)] 99.4 F (37.4 C) (04/30 0733) Pulse Rate:  [96-99] 99 (04/30 0733) Resp:  [18-20] 18 (04/30 0733) BP: (124-131)/(77-82) 125/77 (04/30 0733) SpO2:  [92 %-95 %] 93 % (04/30 0733) Last BM Date: 08/11/19  Intake/Output from previous day: 04/29 0701 - 04/30 0700 In: 516.4 [P.O.:360; I.V.:15.3; IV Piggyback:141.1] Out: 1750 [Urine:750; Emesis/NG output:1000] Intake/Output this shift: No intake/output data recorded.  PE: Gen:  Alert, NAD, pleasant Card:  RRR, no M/G/R heard Pulm:  CTAB, no W/R/R, effort normal Abd: Soft, distended, mild tenderness of the right lower quadrant without rigidity or guarding. Otherwise NT.  Laparoscopic incisions with steristrips in place and clean, dry and intact.  Hypoactive bowel sounds with intermittent high-pitched bowel sounds. NGT without any output.  Flush and unkinked.  900 cc return of bilious output Ext:  No LE edema  Psych: A&Ox3  Skin: no rashes noted, warm and dry  Lab Results:  Recent Labs    08/13/19 0151 08/14/19 0201  WBC 12.7* 12.7*  HGB 11.6* 11.8*  HCT 36.6* 36.9*  PLT 290 321   BMET Recent Labs    08/14/19 0201 08/14/19 1033  NA 137 134*  K 4.0 4.0  CL 105 100  CO2 23 24  GLUCOSE 136* 140*  BUN 12 16  CREATININE 1.11 1.07  CALCIUM 8.4* 8.6*   PT/INR No results for input(s): LABPROT, INR in the last 72 hours. CMP     Component Value Date/Time   NA 134 (L) 08/14/2019 1033   K 4.0 08/14/2019 1033   CL 100 08/14/2019 1033    CO2 24 08/14/2019 1033   GLUCOSE 140 (H) 08/14/2019 1033   GLUCOSE 82 03/22/2006 0834   BUN 16 08/14/2019 1033   CREATININE 1.07 08/14/2019 1033   CALCIUM 8.6 (L) 08/14/2019 1033   PROT 6.8 08/12/2019 0955   ALBUMIN 3.3 (L) 08/12/2019 0955   AST 26 08/12/2019 0955   ALT 34 08/12/2019 0955   ALKPHOS 79 08/12/2019 0955   BILITOT 0.8 08/12/2019 0955   GFRNONAA >60 08/14/2019 1033   GFRAA >60 08/14/2019 1033   Lipase     Component Value Date/Time   LIPASE 23 08/12/2019 0955       Studies/Results: DG Abd 1 View  Result Date: 08/14/2019 CLINICAL DATA:  NG tube placement. EXAM: ABDOMEN - 1 VIEW COMPARISON:  Radiograph earlier this day. CT 08/12/2019 FINDINGS: Tip and side port of the enteric tube below the diaphragm in the stomach. Mild gaseous distention of small bowel and transverse colon, similar to earlier today. IMPRESSION: Tip and side port of the enteric tube below the diaphragm in the stomach. Electronically Signed   By: Keith Rake M.D.   On: 08/14/2019 17:26   DG Abd Portable 1V  Result Date: 08/15/2019 CLINICAL DATA:  Ileus EXAM: PORTABLE ABDOMEN - 1 VIEW COMPARISON:  Yesterday FINDINGS: The enteric tube tip is at the upper stomach. Diffuse gaseous distension of bowel reaching the rectum, stable to borderline  and. No concerning mass effect or gas collection IMPRESSION: Ileus pattern with stable to minimally improved distension. The stomach is moderately distended, is the NG tube functional? Electronically Signed   By: Monte Fantasia M.D.   On: 08/15/2019 08:35   DG Abd Portable 1V  Result Date: 08/14/2019 CLINICAL DATA:  Appendectomy 08/12/2019.  Emesis. EXAM: PORTABLE ABDOMEN - 1 VIEW COMPARISON:  CT 08/12/2019 FINDINGS: Dilated loops of small bowel centrally measuring up to 3.8 cm. No air-fluid levels on supine exam. There is gas in the proximal transverse colon. Gasin the rectum. No intraperitoneal free air on supine exam. IMPRESSION: Dilated loops of small bowel  centrally suggest small bowel ileus. No evidence of high-grade obstruction on supine exam. Electronically Signed   By: Suzy Bouchard M.D.   On: 08/14/2019 11:39    Anti-infectives: Anti-infectives (From admission, onward)   Start     Dose/Rate Route Frequency Ordered Stop   08/13/19 1700  cefTRIAXone (ROCEPHIN) 2 g in sodium chloride 0.9 % 100 mL IVPB     2 g 200 mL/hr over 30 Minutes Intravenous Every 24 hours 08/12/19 2222     08/12/19 2300  metroNIDAZOLE (FLAGYL) IVPB 500 mg     500 mg 100 mL/hr over 60 Minutes Intravenous Every 8 hours 08/12/19 2222     08/12/19 1645  cefTRIAXone (ROCEPHIN) 2 g in sodium chloride 0.9 % 100 mL IVPB     2 g 200 mL/hr over 30 Minutes Intravenous  Once 08/12/19 1636 08/12/19 1800   08/12/19 1645  metroNIDAZOLE (FLAGYL) IVPB 500 mg     500 mg 100 mL/hr over 60 Minutes Intravenous  Once 08/12/19 1636 08/12/19 1804       Assessment/Plan Perforated acute appendicitis with peritoneal abscess S/p laparoscopic appendectomy, Dr. Georgette Dover, 08/12/2019, POD #2 -Patient with ileus.  X-ray with continued ileus pattern.  Continue NGT.  NG tube was flushed and unkinked this morning with 900 cc of bilious return -Continue antibiotics -Mobilize - Pulmonary toilet, incentive spirometer - Am labs pending   FEN - NPO, NGT, IVF VTE - SCDs, Lovenox ID - Rocephin/Flagyl 4/27 >> WBC pending    LOS: 3 days    Jillyn Ledger , [facility] Surgery 08/15/2019, 9:33 AM Please see Amion for pager number during day hours 7:00am-4:30pm

## 2019-08-15 NOTE — Plan of Care (Signed)
  Problem: Education: Goal: Knowledge of General Education information will improve Description: Including pain rating scale, medication(s)/side effects and non-pharmacologic comfort measures Outcome: Adequate for Discharge   

## 2019-08-15 NOTE — Plan of Care (Signed)

## 2019-08-16 ENCOUNTER — Inpatient Hospital Stay (HOSPITAL_COMMUNITY): Payer: Medicare HMO

## 2019-08-16 LAB — CBC
HCT: 40.8 % (ref 39.0–52.0)
Hemoglobin: 13.3 g/dL (ref 13.0–17.0)
MCH: 28.2 pg (ref 26.0–34.0)
MCHC: 32.6 g/dL (ref 30.0–36.0)
MCV: 86.4 fL (ref 80.0–100.0)
Platelets: 403 10*3/uL — ABNORMAL HIGH (ref 150–400)
RBC: 4.72 MIL/uL (ref 4.22–5.81)
RDW: 13.5 % (ref 11.5–15.5)
WBC: 14.9 10*3/uL — ABNORMAL HIGH (ref 4.0–10.5)
nRBC: 0 % (ref 0.0–0.2)

## 2019-08-16 LAB — BASIC METABOLIC PANEL
Anion gap: 10 (ref 5–15)
BUN: 27 mg/dL — ABNORMAL HIGH (ref 8–23)
CO2: 26 mmol/L (ref 22–32)
Calcium: 8.7 mg/dL — ABNORMAL LOW (ref 8.9–10.3)
Chloride: 104 mmol/L (ref 98–111)
Creatinine, Ser: 1.3 mg/dL — ABNORMAL HIGH (ref 0.61–1.24)
GFR calc Af Amer: >60 mL/min (ref >60)
GFR calc non Af Amer: 57 mL/min — ABNORMAL LOW (ref >60)
Glucose, Bld: 136 mg/dL — ABNORMAL HIGH (ref 70–99)
Potassium: 4.4 mmol/L (ref 3.5–5.1)
Sodium: 140 mmol/L (ref 135–145)

## 2019-08-16 LAB — GLUCOSE, CAPILLARY: Glucose-Capillary: 116 mg/dL — ABNORMAL HIGH (ref 70–99)

## 2019-08-16 NOTE — Plan of Care (Signed)

## 2019-08-16 NOTE — Progress Notes (Signed)
4 Days Post-Op   Subjective/Chief Complaint: Feels better with ng, no flatus or bm   Objective: Vital signs in last 24 hours: Temp:  [97.5 F (36.4 C)-99.7 F (37.6 C)] 99.7 F (37.6 C) (05/01 0405) Pulse Rate:  [96-106] 96 (05/01 0405) Resp:  [16-18] 16 (05/01 0405) BP: (116-125)/(74-82) 116/74 (05/01 0405) SpO2:  [91 %-95 %] 91 % (05/01 0405) Last BM Date: 08/15/19  Intake/Output from previous day: 04/30 0701 - 05/01 0700 In: 1450 [I.V.:1300; IV Piggyback:150] Out: 3700 [Urine:400; Emesis/NG output:3300] Intake/Output this shift: No intake/output data recorded.  Gen: Alert, NAD, pleasant Card: RRR, no M/G/R heard Pulm: CTAB, neffort normal Abd: Soft,distended,mild tenderness of the right lower quadrant without rigidity or guarding.Otherwise NT.Laparoscopic incisions with steristrips in place and clean, dry and intact. no bs    Lab Results:  Recent Labs    08/15/19 0952 08/16/19 0319  WBC 16.0* 14.9*  HGB 12.7* 13.3  HCT 39.3 40.8  PLT 387 403*   BMET Recent Labs    08/14/19 1033 08/16/19 0319  NA 134* 140  K 4.0 4.4  CL 100 104  CO2 24 26  GLUCOSE 140* 136*  BUN 16 27*  CREATININE 1.07 1.30*  CALCIUM 8.6* 8.7*   PT/INR No results for input(s): LABPROT, INR in the last 72 hours. ABG No results for input(s): PHART, HCO3 in the last 72 hours.  Invalid input(s): PCO2, PO2  Studies/Results: DG Abd 1 View  Result Date: 08/14/2019 CLINICAL DATA:  NG tube placement. EXAM: ABDOMEN - 1 VIEW COMPARISON:  Radiograph earlier this day. CT 08/12/2019 FINDINGS: Tip and side port of the enteric tube below the diaphragm in the stomach. Mild gaseous distention of small bowel and transverse colon, similar to earlier today. IMPRESSION: Tip and side port of the enteric tube below the diaphragm in the stomach. Electronically Signed   By: Keith Rake M.D.   On: 08/14/2019 17:26   DG Abd Portable 1V  Result Date: 08/15/2019 CLINICAL DATA:  Ileus EXAM:  PORTABLE ABDOMEN - 1 VIEW COMPARISON:  Yesterday FINDINGS: The enteric tube tip is at the upper stomach. Diffuse gaseous distension of bowel reaching the rectum, stable to borderline and. No concerning mass effect or gas collection IMPRESSION: Ileus pattern with stable to minimally improved distension. The stomach is moderately distended, is the NG tube functional? Electronically Signed   By: Monte Fantasia M.D.   On: 08/15/2019 08:35    Anti-infectives: Anti-infectives (From admission, onward)   Start     Dose/Rate Route Frequency Ordered Stop   08/15/19 1600  piperacillin-tazobactam (ZOSYN) IVPB 3.375 g     3.375 g 12.5 mL/hr over 240 Minutes Intravenous Every 8 hours 08/15/19 1458     08/13/19 1700  cefTRIAXone (ROCEPHIN) 2 g in sodium chloride 0.9 % 100 mL IVPB  Status:  Discontinued     2 g 200 mL/hr over 30 Minutes Intravenous Every 24 hours 08/12/19 2222 08/15/19 1458   08/12/19 2300  metroNIDAZOLE (FLAGYL) IVPB 500 mg  Status:  Discontinued     500 mg 100 mL/hr over 60 Minutes Intravenous Every 8 hours 08/12/19 2222 08/15/19 1458   08/12/19 1645  cefTRIAXone (ROCEPHIN) 2 g in sodium chloride 0.9 % 100 mL IVPB     2 g 200 mL/hr over 30 Minutes Intravenous  Once 08/12/19 1636 08/12/19 1800   08/12/19 1645  metroNIDAZOLE (FLAGYL) IVPB 500 mg     500 mg 100 mL/hr over 60 Minutes Intravenous  Once 08/12/19 1636 08/12/19 1804  Assessment/Plan: Perforated acute appendicitis with peritoneal abscess S/plaparoscopic appendectomy, Dr. Georgette Dover, 08/12/2019, POD #3 -Patient with ileus.  X-ray with continued ileus pattern.  Continue NGT. a lot out yesterday, will continue ngt, consider ct if not better but too early right now -Continue antibiotics -Mobilize - Pulmonary toilet, incentive spirometer FEN -NPO, NGT, IVF VTE -SCDs, Lovenox ID -Rocephin/Flagyl 4/27 >> WBC improving  Rolm Bookbinder 08/16/2019

## 2019-08-16 NOTE — Plan of Care (Signed)

## 2019-08-17 LAB — BASIC METABOLIC PANEL
Anion gap: 8 (ref 5–15)
BUN: 27 mg/dL — ABNORMAL HIGH (ref 8–23)
CO2: 28 mmol/L (ref 22–32)
Calcium: 8.6 mg/dL — ABNORMAL LOW (ref 8.9–10.3)
Chloride: 109 mmol/L (ref 98–111)
Creatinine, Ser: 0.98 mg/dL (ref 0.61–1.24)
GFR calc Af Amer: >60 mL/min (ref >60)
GFR calc non Af Amer: >60 mL/min (ref >60)
Glucose, Bld: 114 mg/dL — ABNORMAL HIGH (ref 70–99)
Potassium: 4.2 mmol/L (ref 3.5–5.1)
Sodium: 145 mmol/L (ref 135–145)

## 2019-08-17 LAB — CBC
HCT: 40.2 % (ref 39.0–52.0)
Hemoglobin: 12.8 g/dL — ABNORMAL LOW (ref 13.0–17.0)
MCH: 27.1 pg (ref 26.0–34.0)
MCHC: 31.8 g/dL (ref 30.0–36.0)
MCV: 85.2 fL (ref 80.0–100.0)
Platelets: 399 10*3/uL (ref 150–400)
RBC: 4.72 MIL/uL (ref 4.22–5.81)
RDW: 13.5 % (ref 11.5–15.5)
WBC: 13.4 10*3/uL — ABNORMAL HIGH (ref 4.0–10.5)
nRBC: 0 % (ref 0.0–0.2)

## 2019-08-17 NOTE — Progress Notes (Signed)
[facility] Progress Note  5 Days Post-Op  Subjective: CC-  Feeling a little better than yesterday. Still bloated but passed a little gas this morning. No BM. NG tube with 2.9L out last 24 hours, but he is taking in some ice chips. Ambulated to restroom but that was about it yesterday.  Objective: Vital signs in last 24 hours: Temp:  [97.6 F (36.4 C)-99.6 F (37.6 C)] 97.6 F (36.4 C) (05/02 0742) Pulse Rate:  [83-97] 83 (05/02 0742) Resp:  [15-20] 17 (05/02 0742) BP: (111-140)/(70-82) 124/82 (05/02 0742) SpO2:  [93 %-95 %] 93 % (05/02 0742) Last BM Date: 08/15/19  Intake/Output from previous day: 05/01 0701 - 05/02 0700 In: 2266 [P.O.:360; I.V.:1768.7; IV Piggyback:137.3] Out: 3750 [Urine:850; Emesis/NG output:2900] Intake/Output this shift: Total I/O In: 120 [P.O.:120] Out: -   PE: Gen:  Alert, NAD, pleasant Pulm:  rate and effort normal Abd: Soft, mild distension, nontender, few BS heard, lap incisions cdi  Lab Results:  Recent Labs    08/16/19 0319 08/17/19 0027  WBC 14.9* 13.4*  HGB 13.3 12.8*  HCT 40.8 40.2  PLT 403* 399   BMET Recent Labs    08/16/19 0319 08/17/19 0400  NA 140 145  K 4.4 4.2  CL 104 109  CO2 26 28  GLUCOSE 136* 114*  BUN 27* 27*  CREATININE 1.30* 0.98  CALCIUM 8.7* 8.6*   PT/INR No results for input(s): LABPROT, INR in the last 72 hours. CMP     Component Value Date/Time   NA 145 08/17/2019 0400   K 4.2 08/17/2019 0400   CL 109 08/17/2019 0400   CO2 28 08/17/2019 0400   GLUCOSE 114 (H) 08/17/2019 0400   GLUCOSE 82 03/22/2006 0834   BUN 27 (H) 08/17/2019 0400   CREATININE 0.98 08/17/2019 0400   CALCIUM 8.6 (L) 08/17/2019 0400   PROT 6.8 08/12/2019 0955   ALBUMIN 3.3 (L) 08/12/2019 0955   AST 26 08/12/2019 0955   ALT 34 08/12/2019 0955   ALKPHOS 79 08/12/2019 0955   BILITOT 0.8 08/12/2019 0955   GFRNONAA >60 08/17/2019 0400   GFRAA >60 08/17/2019 0400   Lipase     Component Value Date/Time   LIPASE  23 08/12/2019 0955       Studies/Results: DG Abd Portable 1V  Result Date: 08/16/2019 CLINICAL DATA:  Ileus EXAM: PORTABLE ABDOMEN - 1 VIEW COMPARISON:  08/15/2019 FINDINGS: The patient's enteric tube terminates over the gastric body. There are dilated loops of small bowel measuring up to approximately 4.3 cm. These have worsened since the prior study. There is no evidence for pneumatosis or free air, however evaluation is limited by a single view technique. IMPRESSION: Worsening small bowel dilatation which may represent an ileus or small bowel obstruction. Electronically Signed   By: Constance Holster M.D.   On: 08/16/2019 15:41    Anti-infectives: Anti-infectives (From admission, onward)   Start     Dose/Rate Route Frequency Ordered Stop   08/15/19 1600  piperacillin-tazobactam (ZOSYN) IVPB 3.375 g     3.375 g 12.5 mL/hr over 240 Minutes Intravenous Every 8 hours 08/15/19 1458     08/13/19 1700  cefTRIAXone (ROCEPHIN) 2 g in sodium chloride 0.9 % 100 mL IVPB  Status:  Discontinued     2 g 200 mL/hr over 30 Minutes Intravenous Every 24 hours 08/12/19 2222 08/15/19 1458   08/12/19 2300  metroNIDAZOLE (FLAGYL) IVPB 500 mg  Status:  Discontinued     500 mg 100 mL/hr over 60  Minutes Intravenous Every 8 hours 08/12/19 2222 08/15/19 1458   08/12/19 1645  cefTRIAXone (ROCEPHIN) 2 g in sodium chloride 0.9 % 100 mL IVPB     2 g 200 mL/hr over 30 Minutes Intravenous  Once 08/12/19 1636 08/12/19 1800   08/12/19 1645  metroNIDAZOLE (FLAGYL) IVPB 500 mg     500 mg 100 mL/hr over 60 Minutes Intravenous  Once 08/12/19 1636 08/12/19 1804       Assessment/Plan Perforated acute appendicitis with peritoneal abscess S/plaparoscopic appendectomy, Dr. Georgette Dover, 08/12/2019, POD #4 - Ileus, may be starting to improve as he passed a little flatus this morning. continue NG to LIWS today. Ok to briefly clamp NG tube to allow patient to ambulate, return to LIWS afterwards. Electrolytes look good today.  Repeat labs in AM. May consider CT scan in 1-2 days if not resolving.  -Continue antibiotics  FEN -NPO, NGT, IVF VTE -SCDs, Lovenox ID -Rocephin/Flagyl 4/27 >> WBCimproving, afebrile   LOS: 5 days    Wellington Hampshire, [facility] Surgery 08/17/2019, 11:22 AM Please see Amion for pager number during day hours 7:00am-4:30pm

## 2019-08-18 LAB — BASIC METABOLIC PANEL
Anion gap: 8 (ref 5–15)
BUN: 23 mg/dL (ref 8–23)
CO2: 24 mmol/L (ref 22–32)
Calcium: 8.5 mg/dL — ABNORMAL LOW (ref 8.9–10.3)
Chloride: 114 mmol/L — ABNORMAL HIGH (ref 98–111)
Creatinine, Ser: 0.97 mg/dL (ref 0.61–1.24)
GFR calc Af Amer: >60 mL/min (ref >60)
GFR calc non Af Amer: >60 mL/min (ref >60)
Glucose, Bld: 102 mg/dL — ABNORMAL HIGH (ref 70–99)
Potassium: 4.4 mmol/L (ref 3.5–5.1)
Sodium: 146 mmol/L — ABNORMAL HIGH (ref 135–145)

## 2019-08-18 LAB — CBC
HCT: 36.4 % — ABNORMAL LOW (ref 39.0–52.0)
Hemoglobin: 11.4 g/dL — ABNORMAL LOW (ref 13.0–17.0)
MCH: 27.2 pg (ref 26.0–34.0)
MCHC: 31.3 g/dL (ref 30.0–36.0)
MCV: 86.9 fL (ref 80.0–100.0)
Platelets: 397 10*3/uL (ref 150–400)
RBC: 4.19 MIL/uL — ABNORMAL LOW (ref 4.22–5.81)
RDW: 13.8 % (ref 11.5–15.5)
WBC: 10 10*3/uL (ref 4.0–10.5)
nRBC: 0 % (ref 0.0–0.2)

## 2019-08-18 LAB — MAGNESIUM: Magnesium: 2.3 mg/dL (ref 1.7–2.4)

## 2019-08-18 NOTE — Plan of Care (Signed)
  Problem: Education: Goal: Knowledge of General Education information will improve Description: Including pain rating scale, medication(s)/side effects and non-pharmacologic comfort measures Outcome: Progressing   Problem: Health Behavior/Discharge Planning: Goal: Ability to manage health-related needs will improve Outcome: Progressing  Patient verbalizes understanding of how lovenox prevents blood clots.  Problem: Activity: Goal: Risk for activity intolerance will decrease Outcome: Progressing  Patient denies SOB while performing adl's such as ambulating to bathroom.  Problem: Pain Managment: Goal: General experience of comfort will improve Outcome: Progressing  Patient denies passing much gas at this time, denies abd pain.

## 2019-08-18 NOTE — Progress Notes (Signed)
6 Days Post-Op   Subjective/Chief Complaint: Some flatus yesterday none overnight, bloated, feels better overall   Objective: Vital signs in last 24 hours: Temp:  [97.7 F (36.5 C)-99.4 F (37.4 C)] 99.1 F (37.3 C) (05/03 0740) Pulse Rate:  [76-90] 76 (05/03 0740) Resp:  [14-17] 16 (05/03 0740) BP: (126-143)/(78-83) 135/78 (05/03 0740) SpO2:  [91 %-100 %] 94 % (05/03 0740) Last BM Date: 08/15/19  Intake/Output from previous day: 05/02 0701 - 05/03 0700 In: 490 [P.O.:490] Out: 3550 [Urine:1700; Emesis/NG output:1850] Intake/Output this shift: Total I/O In: 120 [P.O.:120] Out: 300 [Urine:300]  Gen:  Alert, NAD, pleasant Pulm:  clear Abd: Soft, mild distension, nontender, few BS heard, lap incisions cdi  Lab Results:  Recent Labs    08/17/19 0027 08/18/19 0538  WBC 13.4* 10.0  HGB 12.8* 11.4*  HCT 40.2 36.4*  PLT 399 397   BMET Recent Labs    08/17/19 0400 08/18/19 0538  NA 145 146*  K 4.2 4.4  CL 109 114*  CO2 28 24  GLUCOSE 114* 102*  BUN 27* 23  CREATININE 0.98 0.97  CALCIUM 8.6* 8.5*   PT/INR No results for input(s): LABPROT, INR in the last 72 hours. ABG No results for input(s): PHART, HCO3 in the last 72 hours.  Invalid input(s): PCO2, PO2  Studies/Results: No results found.  Anti-infectives: Anti-infectives (From admission, onward)   Start     Dose/Rate Route Frequency Ordered Stop   08/15/19 1600  piperacillin-tazobactam (ZOSYN) IVPB 3.375 g     3.375 g 12.5 mL/hr over 240 Minutes Intravenous Every 8 hours 08/15/19 1458     08/13/19 1700  cefTRIAXone (ROCEPHIN) 2 g in sodium chloride 0.9 % 100 mL IVPB  Status:  Discontinued     2 g 200 mL/hr over 30 Minutes Intravenous Every 24 hours 08/12/19 2222 08/15/19 1458   08/12/19 2300  metroNIDAZOLE (FLAGYL) IVPB 500 mg  Status:  Discontinued     500 mg 100 mL/hr over 60 Minutes Intravenous Every 8 hours 08/12/19 2222 08/15/19 1458   08/12/19 1645  cefTRIAXone (ROCEPHIN) 2 g in sodium  chloride 0.9 % 100 mL IVPB     2 g 200 mL/hr over 30 Minutes Intravenous  Once 08/12/19 1636 08/12/19 1800   08/12/19 1645  metroNIDAZOLE (FLAGYL) IVPB 500 mg     500 mg 100 mL/hr over 60 Minutes Intravenous  Once 08/12/19 1636 08/12/19 1804      Assessment/Plan: Perforated acute appendicitis with peritoneal abscess S/plaparoscopic appendectomy, Dr. Georgette Dover, 08/12/2019, POD #5 - Ileus, may be starting to improve as he passed a little flatus this morning. continue NG to LIWS today. Ok to briefly clamp NG tube to allow patient to ambulate, return to LIWS afterwards.  Repeat labs in AM. May consider CT scan in 1-2 days if not resolving. will recheck later today -Continue antibiotics FEN -NPO, NGT, IVF VTE -SCDs, Lovenox ID -Rocephin/Flagyl 4/27 >> WBCnormal now, afebrile Dispo: once able to tolerate po  Joel Lopez 08/18/2019

## 2019-08-19 ENCOUNTER — Inpatient Hospital Stay (HOSPITAL_COMMUNITY): Payer: Medicare HMO

## 2019-08-19 MED ORDER — IOHEXOL 9 MG/ML PO SOLN
500.0000 mL | ORAL | Status: AC
  Administered 2019-08-19 (×2): 500 mL via ORAL

## 2019-08-19 MED ORDER — IOHEXOL 300 MG/ML  SOLN
100.0000 mL | Freq: Once | INTRAMUSCULAR | Status: AC | PRN
  Administered 2019-08-19: 100 mL via INTRAVENOUS

## 2019-08-19 NOTE — Progress Notes (Addendum)
Walked patient around unit, verbalized feeling weak but able to walk with minimal assist. Had 4 episodes flatus after ambulating.

## 2019-08-19 NOTE — Progress Notes (Signed)
Patient ID: Joel Lopez, male   DOB: Jun 10, 1950, 69 y.o.   MRN: [id number]    7 Days Post-Op  Subjective: Feels ok today.  Had 1 episode of flatus this am, but that's the first in a couple of days.  Ambulating.  No nausea.  No significant abdominal pain.  ROS: See above, otherwise other systems negative  Objective: Vital signs in last 24 hours: Temp:  [98 F (36.7 C)-98.9 F (37.2 C)] 98.9 F (37.2 C) (05/04 0444) Pulse Rate:  [64-76] 64 (05/04 0444) Resp:  [14-18] 14 (05/04 0444) BP: (135-143)/(70-83) 143/83 (05/04 0444) SpO2:  [95 %-98 %] 96 % (05/04 0444) Last BM Date: 08/15/19  Intake/Output from previous day: 05/03 0701 - 05/04 0700 In: 720 [P.O.:360; NG/GT:360] Out: 1850 [Urine:1500; Emesis/NG output:350] Intake/Output this shift: No intake/output data recorded.  PE: Abd: soft, mild bloating, hypoactive to absent BS, NGT with significant decrease in output yesterday.  Incisions c/d/i with steri-strips in place  Lab Results:  Recent Labs    08/17/19 0027 08/18/19 0538  WBC 13.4* 10.0  HGB 12.8* 11.4*  HCT 40.2 36.4*  PLT 399 397   BMET Recent Labs    08/17/19 0400 08/18/19 0538  NA 145 146*  K 4.2 4.4  CL 109 114*  CO2 28 24  GLUCOSE 114* 102*  BUN 27* 23  CREATININE 0.98 0.97  CALCIUM 8.6* 8.5*   PT/INR No results for input(s): LABPROT, INR in the last 72 hours. CMP     Component Value Date/Time   NA 146 (H) 08/18/2019 0538   K 4.4 08/18/2019 0538   CL 114 (H) 08/18/2019 0538   CO2 24 08/18/2019 0538   GLUCOSE 102 (H) 08/18/2019 0538   GLUCOSE 82 03/22/2006 0834   BUN 23 08/18/2019 0538   CREATININE 0.97 08/18/2019 0538   CALCIUM 8.5 (L) 08/18/2019 0538   PROT 6.8 08/12/2019 0955   ALBUMIN 3.3 (L) 08/12/2019 0955   AST 26 08/12/2019 0955   ALT 34 08/12/2019 0955   ALKPHOS 79 08/12/2019 0955   BILITOT 0.8 08/12/2019 0955   GFRNONAA >60 08/18/2019 0538   GFRAA >60 08/18/2019 0538   Lipase     Component Value Date/Time   LIPASE  23 08/12/2019 0955       Studies/Results: No results found.  Anti-infectives: Anti-infectives (From admission, onward)   Start     Dose/Rate Route Frequency Ordered Stop   08/15/19 1600  piperacillin-tazobactam (ZOSYN) IVPB 3.375 g     3.375 g 12.5 mL/hr over 240 Minutes Intravenous Every 8 hours 08/15/19 1458     08/13/19 1700  cefTRIAXone (ROCEPHIN) 2 g in sodium chloride 0.9 % 100 mL IVPB  Status:  Discontinued     2 g 200 mL/hr over 30 Minutes Intravenous Every 24 hours 08/12/19 2222 08/15/19 1458   08/12/19 2300  metroNIDAZOLE (FLAGYL) IVPB 500 mg  Status:  Discontinued     500 mg 100 mL/hr over 60 Minutes Intravenous Every 8 hours 08/12/19 2222 08/15/19 1458   08/12/19 1645  cefTRIAXone (ROCEPHIN) 2 g in sodium chloride 0.9 % 100 mL IVPB     2 g 200 mL/hr over 30 Minutes Intravenous  Once 08/12/19 1636 08/12/19 1800   08/12/19 1645  metroNIDAZOLE (FLAGYL) IVPB 500 mg     500 mg 100 mL/hr over 60 Minutes Intravenous  Once 08/12/19 1636 08/12/19 1804       Assessment/Plan Perforated acute appendicitis with peritoneal abscess POD 7, S/plaparoscopic appendectomy, Dr. Georgette Dover, 08/12/2019 -Ileus, may  be starting to improve as he passed a little flatus this morning. However given persistent bloating and minimal bowel activity will do CT A/P with contrast to evaluate for post op complication vs therapeutic needs to get abdomen to wake up. -cont to mobilize -Continue antibiotics FEN -NPO, NGT, IVF VTE -SCDs, Lovenox ID -Rocephin/Flagyl 4/27 >> WBCnormal now, afebrile   LOS: 7 days    Henreitta Cea , [facility] 08/19/2019, 9:29 AM Please see Amion for pager number during day hours 7:00am-4:30pm or 7:00am -11:30am on weekends

## 2019-08-20 ENCOUNTER — Inpatient Hospital Stay: Payer: Self-pay

## 2019-08-20 ENCOUNTER — Inpatient Hospital Stay (HOSPITAL_COMMUNITY): Payer: Medicare HMO

## 2019-08-20 MED ORDER — POTASSIUM CHLORIDE IN NACL 20-0.9 MEQ/L-% IV SOLN
INTRAVENOUS | Status: DC
  Filled 2019-08-20 (×6): qty 1000

## 2019-08-20 MED ORDER — TRAVASOL 10 % IV SOLN
INTRAVENOUS | Status: DC
  Filled 2019-08-20: qty 480

## 2019-08-20 NOTE — Plan of Care (Signed)
  Problem: Education: Goal: Knowledge of General Education information will improve Description: Including pain rating scale, medication(s)/side effects and non-pharmacologic comfort measures Outcome: Progressing   Problem: Health Behavior/Discharge Planning: Goal: Ability to manage health-related needs will improve Outcome: Progressing   Problem: Clinical Measurements: Goal: Will remain free from infection Outcome: Progressing   Problem: Activity: Goal: Risk for activity intolerance will decrease Outcome: Progressing   Problem: Nutrition: Goal: Adequate nutrition will be maintained Outcome: Not Progressing   Problem: Coping: Goal: Level of anxiety will decrease Outcome: Completed/Met

## 2019-08-20 NOTE — Progress Notes (Addendum)
Initial Nutrition Assessment  DOCUMENTATION CODES:   Not applicable  INTERVENTION:   -RD will follow for diet advancement and adjust supplement regimen as appropriate  NUTRITION DIAGNOSIS:   Inadequate oral intake related to altered GI function as evidenced by NPO status.  GOAL:   Patient will meet greater than or equal to 90% of their needs  MONITOR:   Diet advancement, Labs, Weight trends, Skin, I & O's  REASON FOR ASSESSMENT:   NPO/Clear Liquid Diet, Consult New TPN/TNA  ASSESSMENT:   69 year old male who presents with a week of lower abdominal pain that has now localized to the RLQ.  He was febrile up to 101.3 with some diarrhea, but no nausea or vomiting.  He came to the ED for evaluation.  CT scan showed acute appendicitis without sign of perforation, but significant stranding.  Pt admitted with perforated acute appendicitis with peritoneal abscess.   4/27- s/p lap appendectomy 4/29- NGT placed 5/4- CT without evidence of abscess  Reviewed I/O's: +4.5 L x 24 hours and -1.6 L since admission  UOP: 1.3 L x 24 hours  NGT output: 900 ml x 24 hours  Pt receiving nursing care at time of visit. Per discussion with IV team, pt has refused PICC placement. MD notes state to hold TPN for today and hopeful for improvement. NGT output has decreased.    Reviewed wt hx; wt has been stable over the past year.   Medications reviewed and include 0.9% NaCl with Kl 20 mEq/L infusion.   Labs reviewed: Na: 146.   Diet Order:   Diet Order            Diet NPO time specified Except for: Ice Chips  Diet effective now              EDUCATION NEEDS:   No education needs have been identified at this time  Skin:  Skin Assessment: Skin Integrity Issues: Skin Integrity Issues:: Incisions Incisions: closed abdomen  Last BM:  08/20/19  Height:   Ht Readings from Last 1 Encounters:  08/12/19 5\' 11"  (1.803 m)    Weight:   Wt Readings from Last 1 Encounters:  08/12/19  88.5 kg    Ideal Body Weight:  78.2 kg  BMI:  Body mass index is 27.2 kg/m.  Estimated Nutritional Needs:   Kcal:  2150-2350  Protein:  115-130 grams  Fluid:  > 2.1 L    Loistine Chance, RD, LDN, [place] Registered Dietitian II Certified Diabetes Care and Education Specialist Please refer to [facility] for RD and/or RD on-call/weekend/after hours pager

## 2019-08-20 NOTE — Progress Notes (Addendum)
8 Days Post-Op  Subjective: CC: Frustrated. Still feels distended. No real pain or nausea. Passed a small amount of flatus this am at 630am. No BM. Mobilizing   Objective: Vital signs in last 24 hours: Temp:  [98.7 F (37.1 C)-99.5 F (37.5 C)] 98.7 F (37.1 C) (05/05 0825) Pulse Rate:  [64-78] 64 (05/05 0825) Resp:  [16-18] 18 (05/05 0825) BP: (129-140)/(75-82) 140/76 (05/05 0825) SpO2:  [94 %-96 %] 95 % (05/05 0825) Last BM Date: 08/15/19  Intake/Output from previous day: 05/04 0701 - 05/05 0700 In: [id number] [P.O.:240; I.V.:6034.8; IV Piggyback:337.2] Out: 2150 [Urine:1250; Emesis/NG output:900] Intake/Output this shift: Total I/O In: 240 [P.O.:240] Out: 250 [Urine:250]  PE: Gen: Awake and alert, NAD HEENT: NGT with 900cc/24 hours. This was occluded. I was able to flush and break up occlusion to get this working again.  Lungs: Normal rate and effort  Abd: soft, mild bloating, hypoactive to absent BS, NGT with significant decrease in output yesterday.  Incisions c/d/i with steri-strips in place Msk: No edema   Lab Results:  Recent Labs    08/18/19 0538  WBC 10.0  HGB 11.4*  HCT 36.4*  PLT 397   BMET Recent Labs    08/18/19 0538  NA 146*  K 4.4  CL 114*  CO2 24  GLUCOSE 102*  BUN 23  CREATININE 0.97  CALCIUM 8.5*   PT/INR No results for input(s): LABPROT, INR in the last 72 hours. CMP     Component Value Date/Time   NA 146 (H) 08/18/2019 0538   K 4.4 08/18/2019 0538   CL 114 (H) 08/18/2019 0538   CO2 24 08/18/2019 0538   GLUCOSE 102 (H) 08/18/2019 0538   GLUCOSE 82 03/22/2006 0834   BUN 23 08/18/2019 0538   CREATININE 0.97 08/18/2019 0538   CALCIUM 8.5 (L) 08/18/2019 0538   PROT 6.8 08/12/2019 0955   ALBUMIN 3.3 (L) 08/12/2019 0955   AST 26 08/12/2019 0955   ALT 34 08/12/2019 0955   ALKPHOS 79 08/12/2019 0955   BILITOT 0.8 08/12/2019 0955   GFRNONAA >60 08/18/2019 0538   GFRAA >60 08/18/2019 0538   Lipase     Component Value Date/Time     LIPASE 23 08/12/2019 0955       Studies/Results: CT ABDOMEN PELVIS W CONTRAST  Result Date: 08/19/2019 CLINICAL DATA:  Abdominal pain. Persistent ileus. 1 week postop from appendectomy. EXAM: CT ABDOMEN AND PELVIS WITH CONTRAST TECHNIQUE: Multidetector CT imaging of the abdomen and pelvis was performed using the standard protocol following bolus administration of intravenous contrast. CONTRAST:  155mL OMNIPAQUE IOHEXOL 300 MG/ML  SOLN COMPARISON:  08/12/2019 FINDINGS: Lower Chest: New small bilateral pleural effusions and dependent atelectasis. Hepatobiliary: No hepatic masses identified. Stable tiny cysts in posterior right and left lobes. Gallbladder is unremarkable. No evidence of biliary ductal dilatation. Pancreas:  No mass or inflammatory changes. Spleen: Within normal limits in size and appearance. Adrenals/Urinary Tract: No masses identified. No evidence of ureteral calculi or hydronephrosis. Stomach/Bowel: Postop changes from appendectomy noted. Nasogastric tube is seen with tip in the body the stomach. Markedly dilated small bowel loops are seen containing air-fluid levels. Transition point is seen involving the distal ileum in the right lower quadrant, in the region of surgical clips. This is highly suspicious for a distal small bowel obstruction due to adhesion. No evidence of free intraperitoneal air. Small amount of free fluid is seen in the dependent pelvis, however no definite abscess is visualized. Vascular/Lymphatic: No pathologically enlarged lymph nodes.  No abdominal aortic aneurysm. Aortic atherosclerosis incidentally noted. Reproductive:  No mass or other significant abnormality. Other:  None. Musculoskeletal:  No suspicious bone lesions identified. IMPRESSION: 1. High-grade distal small bowel obstruction, with transition point involving distal ileum adjacent to surgical clips, suspicious for adhesion. 2. Small amount of free fluid in dependent pelvis. No definite abscess identified.  3. New small bilateral pleural effusions and dependent atelectasis. Aortic Atherosclerosis (ICD10-I70.0). Electronically Signed   By: Marlaine Hind M.D.   On: 08/19/2019 17:02   DG Abd Portable 1V  Result Date: 08/20/2019 CLINICAL DATA:  Ileus EXAM: PORTABLE ABDOMEN - 1 VIEW COMPARISON:  08/16/2019 KUB 08/19/2019 CT abdomen/pelvis FINDINGS: Severe gaseous distension of the small bowel measuring up to 5.7 cm which appears similar to the CT of the abdomen dated 08/19/2019. Small amount air in the ascending colon. There is no bowel dilatation to suggest obstruction. There is no evidence of pneumoperitoneum, portal venous gas or pneumatosis. There are no pathologic calcifications along the expected course of the ureters. The osseous structures are unremarkable. IMPRESSION: Severe gaseous distension of the small bowel measuring up to 5.7 cm consistent with persistent small-bowel obstruction without significant interval change compared with 08/19/2019. Electronically Signed   By: Kathreen Devoid   On: 08/20/2019 07:54   [place] EKG SITE RITE  Result Date: 08/20/2019 If Site Rite image not attached, placement could not be confirmed due to current cardiac rhythm.   Anti-infectives: Anti-infectives (From admission, onward)   Start     Dose/Rate Route Frequency Ordered Stop   08/15/19 1600  piperacillin-tazobactam (ZOSYN) IVPB 3.375 g     3.375 g 12.5 mL/hr over 240 Minutes Intravenous Every 8 hours 08/15/19 1458     08/13/19 1700  cefTRIAXone (ROCEPHIN) 2 g in sodium chloride 0.9 % 100 mL IVPB  Status:  Discontinued     2 g 200 mL/hr over 30 Minutes Intravenous Every 24 hours 08/12/19 2222 08/15/19 1458   08/12/19 2300  metroNIDAZOLE (FLAGYL) IVPB 500 mg  Status:  Discontinued     500 mg 100 mL/hr over 60 Minutes Intravenous Every 8 hours 08/12/19 2222 08/15/19 1458   08/12/19 1645  cefTRIAXone (ROCEPHIN) 2 g in sodium chloride 0.9 % 100 mL IVPB     2 g 200 mL/hr over 30 Minutes Intravenous  Once 08/12/19 1636  08/12/19 1800   08/12/19 1645  metroNIDAZOLE (FLAGYL) IVPB 500 mg     500 mg 100 mL/hr over 60 Minutes Intravenous  Once 08/12/19 1636 08/12/19 1804       Assessment/Plan Perforated acute appendicitis with peritoneal abscess POD 8, S/plaparoscopic appendectomy, Dr. Georgette Dover, 08/12/2019 -CT 5/4 w/out abscess - Prolonged ileus. Dilated small bowel on xray this morning. I was able to fix occluded NGT this am.  - Will order PICC and TPN -cont to mobilize -Continue antibiotics  FEN -NPO, NGT, TPN VTE -SCDs, Lovenox ID -Rocephin/Flagyl 4/27 - 4/30. Zosyn 4/30 >> WBCnormal now, afebrile   LOS: 8 days    Jillyn Ledger , [facility] 08/20/2019, 9:21 AM Please see Amion for pager number during day hours 7:00am-4:30pm

## 2019-08-20 NOTE — Progress Notes (Signed)
Arrived to place PICC.  Patient states that he wants to speak with doctor as "I don't think I need that thing".  Iona Beard, RN updated.

## 2019-08-20 NOTE — Progress Notes (Signed)
PHARMACY - TOTAL PARENTERAL NUTRITION CONSULT NOTE   Indication: Prolonged ileus   PMH: GERD, arthritis, anemia, sleep apnea, hemorrhoids, labyrinthitis, plantar fascitis, tendon rupture  Patient Measurements: Height: 5\' 11"  (180.3 cm) Weight: 88.5 kg (195 lb) IBW/kg (Calculated) : 75.3 TPN AdjBW (KG): 88.5 Body mass index is 27.2 kg/m. Usual Weight:    Assessment: Perforated acute appendicitis with peritoneal abscess POD 8 (08/20/19),S/plaparoscopic appendectomy, Dr. Georgette Dover, 08/12/2019  GI: Minimal flatus for days.  Glucose / Insulin: 102 on BMET  Electrolytes: Cl 114 up,   Renal: Scr 0.97  LFTs / TGs: LFT's WNL on 08/12/19.   Prealbumin / albumin:  Albumin was 3.3 on 08/12/19  Intake / Output; MIVF:  - Occluded NGT fixed 5/5: 951ml yesterday - IVF: NS K20 at 48ml/hr  GI Imaging:  -5/4: CT w/out abscess,  - 5/5: Dilated small bowel on xray this morning  Surgeries / Procedures: S/plaparoscopic appendectomy 08/12/2019  ID: Rocephin/Flagyl 4/27 - 4/30.    Zosyn 4/30 >>   Central access: PICC to be placed 5/5.  TPN start date: 08/20/19  Nutritional Goals (per RD recommendation on  ): kCal:  , Protein:  , Fluid:   Goal TPN rate is   mL/hr (provides   g of protein and   kcals per day)  Current Nutrition:  NPO except ice chips  Plan:  Start TPN at 40 mL/hr at 1800 (will provide 48g AA, 19.2g lipids, 192g dextrose, 1036 kcal) . Goal pending RD assessment.  Electrolytes in TPN: 15mEq/L of Na, 85mEq/L of K, 19mEq/L of Ca, 29mEq/L of Mg, and 12mmol/L of Phos. Cl:Ac ratio 1:2  Add standard MVI and trace elements to TPN  Initiate CBG checks and start SSI tomorrow if required  Reduce MIVF to 35 mL/hr at 1800  Monitor TPN labs on Mon/Thurs, and PRN   Kyson Kupper S. Alford Highland, PharmD, BCPS Clinical Staff Pharmacist [email or web address] Alford Highland, [employer] 08/20/2019,9:52 AM

## 2019-08-21 ENCOUNTER — Inpatient Hospital Stay (HOSPITAL_COMMUNITY): Payer: Medicare HMO

## 2019-08-21 LAB — CBC
HCT: 40.1 % (ref 39.0–52.0)
Hemoglobin: 12.7 g/dL — ABNORMAL LOW (ref 13.0–17.0)
MCH: 27.3 pg (ref 26.0–34.0)
MCHC: 31.7 g/dL (ref 30.0–36.0)
MCV: 86.2 fL (ref 80.0–100.0)
Platelets: 369 10*3/uL (ref 150–400)
RBC: 4.65 MIL/uL (ref 4.22–5.81)
RDW: 13.6 % (ref 11.5–15.5)
WBC: 9.7 10*3/uL (ref 4.0–10.5)
nRBC: 0 % (ref 0.0–0.2)

## 2019-08-21 MED ORDER — BOOST / RESOURCE BREEZE PO LIQD CUSTOM
1.0000 | Freq: Three times a day (TID) | ORAL | Status: DC
  Administered 2019-08-21 – 2019-08-22 (×4): 1 via ORAL

## 2019-08-21 MED ORDER — ADULT MULTIVITAMIN W/MINERALS CH
1.0000 | ORAL_TABLET | Freq: Every day | ORAL | Status: DC
  Administered 2019-08-21 – 2019-08-27 (×7): 1 via ORAL
  Filled 2019-08-21 (×7): qty 1

## 2019-08-21 NOTE — Progress Notes (Signed)
Nutrition Follow-up  DOCUMENTATION CODES:   Not applicable  INTERVENTION:   -Boost Breeze po TID, each supplement provides 250 kcal and 9 grams of protein -MVI with minerals daily -RD will follow for diet advancement and adjust supplement regimen as appropriate  NUTRITION DIAGNOSIS:   Inadequate oral intake related to altered GI function as evidenced by NPO status.  Ongoing  GOAL:   Patient will meet greater than or equal to 90% of their needs  Progressing   MONITOR:   Diet advancement, Labs, Weight trends, Skin, I & O's  REASON FOR ASSESSMENT:   NPO/Clear Liquid Diet, Consult New TPN/TNA  ASSESSMENT:   69 year old male who presents with a week of lower abdominal pain that has now localized to the RLQ.  He was febrile up to 101.3 with some diarrhea, but no nausea or vomiting.  He came to the ED for evaluation.  CT scan showed acute appendicitis without sign of perforation, but significant stranding.  4/27- s/p lap appendectomy 4/29- NGT placed 5/4- CT without evidence of abscess  Reviewed I/O's: -2.2 L x 24 hours and -3.8 L since admission  UOP: 1.7 L x 24 hours  NGT output: 1.1 L x 24 hours  Spoke with pt pt and sister Geni Bers) at bedside. PTA, pt reports great appetite, consuming 3 meals per day (Breakfast: oatmeal, fruit, and yogurt; Lunch: soup/salad and sandwich; Dinner: meat, starch, and vegetable). Pt reports he tries to eat healthfully and consumes a lot of protein as well as fresh fruits and vegetables. He denies any trouble with appetite or weight loss PTA. Per his reports UBW is around 185-190#.  Pt still with NGT. He verbalizes plan to clamp NGT and sip clear liquids from the floor. If pt does well with clamping trials, plan to remove NGT and start CLD today. Pt very resistant to start TPN. Provided ot with encouragement and emotional support and why TPN would be an appropriate source of nutrition if pt unable to advance diet. Pt very hopeful to  advance diet, stating abdomen is less distended today.   Labs reviewed: Na: 146.   NUTRITION - FOCUSED PHYSICAL EXAM:    Most Recent Value  Orbital Region  No depletion  Upper Arm Region  No depletion  Thoracic and Lumbar Region  No depletion  Buccal Region  No depletion  Temple Region  No depletion  Clavicle Bone Region  No depletion  Clavicle and Acromion Bone Region  No depletion  Scapular Bone Region  No depletion  Dorsal Hand  No depletion  Patellar Region  No depletion  Anterior Thigh Region  No depletion  Posterior Calf Region  No depletion  Edema (RD Assessment)  None  Hair  Reviewed  Eyes  Reviewed  Mouth  Reviewed  Skin  Reviewed  Nails  Reviewed       Diet Order:   Diet Order            Diet NPO time specified Except for: Ice Chips  Diet effective now              EDUCATION NEEDS:   No education needs have been identified at this time  Skin:  Skin Assessment: Skin Integrity Issues: Skin Integrity Issues:: Incisions Incisions: closed abdomen  Last BM:  08/20/19  Height:   Ht Readings from Last 1 Encounters:  08/12/19 5\' 11"  (1.803 m)    Weight:   Wt Readings from Last 1 Encounters:  08/12/19 88.5 kg    Ideal Body  Weight:  78.2 kg  BMI:  Body mass index is 27.2 kg/m.  Estimated Nutritional Needs:   Kcal:  2150-2350  Protein:  115-130 grams  Fluid:  > 2.1 L   Loistine Chance, RD, LDN, [place] Registered Dietitian II Certified Diabetes Care and Education Specialist Please refer to [facility] for RD and/or RD on-call/weekend/after hours pager

## 2019-08-21 NOTE — Progress Notes (Signed)
9 Days Post-Op  Subjective: CC: Patient feels much better this morning.  He denies any abdominal pain, nausea or abdominal distention.  He does not feel full in his epigastrium any longer.  NG tube output 1050 cc / 24 hours.  He reports he had 2 large liquidy bowel movements yesterday and feels he is going to have another one this morning.  Objective: Vital signs in last 24 hours: Temp:  [97.9 F (36.6 C)-99 F (37.2 C)] 99 F (37.2 C) (05/06 0423) Pulse Rate:  [79-81] 79 (05/06 0423) Resp:  [15-16] 16 (05/06 0423) BP: (128-140)/(77-82) 128/77 (05/06 0423) SpO2:  [96 %-99 %] 96 % (05/06 0423) Last BM Date: 08/20/19  Intake/Output from previous day: 05/05 0701 - 05/06 0700 In: 800 [P.O.:480; NG/GT:120; IV Piggyback:200] Out: 2700 [Urine:1650; Emesis/NG output:1050] Intake/Output this shift: No intake/output data recorded.  PE: Gen: Awake and alert, NAD HEENT: NGT with 1050cc/24 hours. Bilious Lungs: Normal rate and effort  Abd: soft, mild distension, more active BS, NT.Incisions c/d/i with steri-strips in place Msk: No edema   Lab Results:  No results for input(s): WBC, HGB, HCT, PLT in the last 72 hours. BMET No results for input(s): NA, K, CL, CO2, GLUCOSE, BUN, CREATININE, CALCIUM in the last 72 hours. PT/INR No results for input(s): LABPROT, INR in the last 72 hours. CMP     Component Value Date/Time   NA 146 (H) 08/18/2019 0538   K 4.4 08/18/2019 0538   CL 114 (H) 08/18/2019 0538   CO2 24 08/18/2019 0538   GLUCOSE 102 (H) 08/18/2019 0538   GLUCOSE 82 03/22/2006 0834   BUN 23 08/18/2019 0538   CREATININE 0.97 08/18/2019 0538   CALCIUM 8.5 (L) 08/18/2019 0538   PROT 6.8 08/12/2019 0955   ALBUMIN 3.3 (L) 08/12/2019 0955   AST 26 08/12/2019 0955   ALT 34 08/12/2019 0955   ALKPHOS 79 08/12/2019 0955   BILITOT 0.8 08/12/2019 0955   GFRNONAA >60 08/18/2019 0538   GFRAA >60 08/18/2019 0538   Lipase     Component Value Date/Time   LIPASE 23 08/12/2019  0955       Studies/Results: CT ABDOMEN PELVIS W CONTRAST  Result Date: 08/19/2019 CLINICAL DATA:  Abdominal pain. Persistent ileus. 1 week postop from appendectomy. EXAM: CT ABDOMEN AND PELVIS WITH CONTRAST TECHNIQUE: Multidetector CT imaging of the abdomen and pelvis was performed using the standard protocol following bolus administration of intravenous contrast. CONTRAST:  160mL OMNIPAQUE IOHEXOL 300 MG/ML  SOLN COMPARISON:  08/12/2019 FINDINGS: Lower Chest: New small bilateral pleural effusions and dependent atelectasis. Hepatobiliary: No hepatic masses identified. Stable tiny cysts in posterior right and left lobes. Gallbladder is unremarkable. No evidence of biliary ductal dilatation. Pancreas:  No mass or inflammatory changes. Spleen: Within normal limits in size and appearance. Adrenals/Urinary Tract: No masses identified. No evidence of ureteral calculi or hydronephrosis. Stomach/Bowel: Postop changes from appendectomy noted. Nasogastric tube is seen with tip in the body the stomach. Markedly dilated small bowel loops are seen containing air-fluid levels. Transition point is seen involving the distal ileum in the right lower quadrant, in the region of surgical clips. This is highly suspicious for a distal small bowel obstruction due to adhesion. No evidence of free intraperitoneal air. Small amount of free fluid is seen in the dependent pelvis, however no definite abscess is visualized. Vascular/Lymphatic: No pathologically enlarged lymph nodes. No abdominal aortic aneurysm. Aortic atherosclerosis incidentally noted. Reproductive:  No mass or other significant abnormality. Other:  None. Musculoskeletal:  No suspicious bone lesions identified. IMPRESSION: 1. High-grade distal small bowel obstruction, with transition point involving distal ileum adjacent to surgical clips, suspicious for adhesion. 2. Small amount of free fluid in dependent pelvis. No definite abscess identified. 3. New small bilateral  pleural effusions and dependent atelectasis. Aortic Atherosclerosis (ICD10-I70.0). Electronically Signed   By: Marlaine Hind M.D.   On: 08/19/2019 17:02   DG Abd Portable 1V  Result Date: 08/21/2019 CLINICAL DATA:  Ileus EXAM: PORTABLE ABDOMEN - 1 VIEW COMPARISON:  08/20/2019 FINDINGS: Gaseous distension of small bowel predominantly, unchanged. No free air organomegaly. No acute bony abnormality. IMPRESSION: Gaseous distention of small bowel with little gas in the colon. Findings most compatible with small bowel obstruction. No change. Electronically Signed   By: Rolm Baptise M.D.   On: 08/21/2019 08:14   DG Abd Portable 1V  Result Date: 08/20/2019 CLINICAL DATA:  Ileus EXAM: PORTABLE ABDOMEN - 1 VIEW COMPARISON:  08/16/2019 KUB 08/19/2019 CT abdomen/pelvis FINDINGS: Severe gaseous distension of the small bowel measuring up to 5.7 cm which appears similar to the CT of the abdomen dated 08/19/2019. Small amount air in the ascending colon. There is no bowel dilatation to suggest obstruction. There is no evidence of pneumoperitoneum, portal venous gas or pneumatosis. There are no pathologic calcifications along the expected course of the ureters. The osseous structures are unremarkable. IMPRESSION: Severe gaseous distension of the small bowel measuring up to 5.7 cm consistent with persistent small-bowel obstruction without significant interval change compared with 08/19/2019. Electronically Signed   By: Kathreen Devoid   On: 08/20/2019 07:54   [place] EKG SITE RITE  Result Date: 08/20/2019 If Site Rite image not attached, placement could not be confirmed due to current cardiac rhythm.   Anti-infectives: Anti-infectives (From admission, onward)   Start     Dose/Rate Route Frequency Ordered Stop   08/15/19 1600  piperacillin-tazobactam (ZOSYN) IVPB 3.375 g     3.375 g 12.5 mL/hr over 240 Minutes Intravenous Every 8 hours 08/15/19 1458     08/13/19 1700  cefTRIAXone (ROCEPHIN) 2 g in sodium chloride 0.9 % 100 mL  IVPB  Status:  Discontinued     2 g 200 mL/hr over 30 Minutes Intravenous Every 24 hours 08/12/19 2222 08/15/19 1458   08/12/19 2300  metroNIDAZOLE (FLAGYL) IVPB 500 mg  Status:  Discontinued     500 mg 100 mL/hr over 60 Minutes Intravenous Every 8 hours 08/12/19 2222 08/15/19 1458   08/12/19 1645  cefTRIAXone (ROCEPHIN) 2 g in sodium chloride 0.9 % 100 mL IVPB     2 g 200 mL/hr over 30 Minutes Intravenous  Once 08/12/19 1636 08/12/19 1800   08/12/19 1645  metroNIDAZOLE (FLAGYL) IVPB 500 mg     500 mg 100 mL/hr over 60 Minutes Intravenous  Once 08/12/19 1636 08/12/19 1804       Assessment/Plan Perforated acute appendicitis with peritoneal abscess POD 9,S/plaparoscopic appendectomy, Dr. Georgette Dover, 08/12/2019 -CT 5/4 w/out abscess - Prolonged ileus. Improving. Xray likely lagging behind. Will do clamping trial - If fails clampping trial, will need PICC and TPN - Cont to mobilize - Continue antibiotics. Discuss with MD total duration.   FEN -NPO, NGT, TPN VTE -SCDs, Lovenox ID -Rocephin/Flagyl 4/27 - 4/30. Zosyn 4/30 >> WBCnormal now, afebrile   LOS: 9 days    Jillyn Ledger , [facility] 08/21/2019, 10:36 AM Please see Amion for pager number during day hours 7:00am-4:30pm

## 2019-08-21 NOTE — Plan of Care (Signed)
  Problem: Clinical Measurements: Goal: Diagnostic test results will improve Outcome: Progressing   Problem: Clinical Measurements: Goal: Cardiovascular complication will be avoided Outcome: Progressing   Problem: Pain Managment: Goal: General experience of comfort will improve Outcome: Progressing   

## 2019-08-21 NOTE — Plan of Care (Signed)

## 2019-08-21 NOTE — Progress Notes (Signed)
Patient NG tube clamped per order.

## 2019-08-22 MED ORDER — ENSURE ENLIVE PO LIQD
237.0000 mL | Freq: Two times a day (BID) | ORAL | Status: DC
  Administered 2019-08-22 (×2): 237 mL via ORAL

## 2019-08-22 NOTE — Plan of Care (Signed)

## 2019-08-22 NOTE — Progress Notes (Signed)
Pt with slight abdominal distention. Prior surgical steri strips C/D/I.  Noted Bowel Sounds are hypoactive X4 quadrants. Pt ambulated in room and to bathroom. Pt denies flatus or belching at this time.   IVF's maintained and infusing without difficulty. Pt denies pain at this time. No  Nausea of vomiting, NGT remained clamped per MD order.

## 2019-08-22 NOTE — Progress Notes (Signed)
10 Days Post-Op   Subjective/Chief Complaint: tol ng clamped for 24 hours, having bowel function, still a little bloated   Objective: Vital signs in last 24 hours: Temp:  [98.7 F (37.1 C)-99.2 F (37.3 C)] 99.2 F (37.3 C) (05/07 0503) Pulse Rate:  [80-98] 80 (05/07 0503) Resp:  [16-18] 16 (05/07 0503) BP: (132-138)/(77-83) 134/82 (05/07 0503) SpO2:  [95 %-98 %] 97 % (05/07 0503) Last BM Date: 08/21/19  Intake/Output from previous day: 05/06 0701 - 05/07 0700 In: 640 [P.O.:300; NG/GT:240; IV Piggyback:100] Out: 1500 [Urine:1100; Emesis/NG output:400] Intake/Output this shift: No intake/output data recorded.  Gen: Awake and alert, NAD Lungs: Normal rate and effort Abd: soft, mild distension, active BS, NT.Incisions c/d/i with steri-strips in place    Lab Results:  Recent Labs    08/21/19 1604  WBC 9.7  HGB 12.7*  HCT 40.1  PLT 369   BMET No results for input(s): NA, K, CL, CO2, GLUCOSE, BUN, CREATININE, CALCIUM in the last 72 hours. PT/INR No results for input(s): LABPROT, INR in the last 72 hours. ABG No results for input(s): PHART, HCO3 in the last 72 hours.  Invalid input(s): PCO2, PO2  Studies/Results: DG Abd Portable 1V  Result Date: 08/21/2019 CLINICAL DATA:  Ileus EXAM: PORTABLE ABDOMEN - 1 VIEW COMPARISON:  08/20/2019 FINDINGS: Gaseous distension of small bowel predominantly, unchanged. No free air organomegaly. No acute bony abnormality. IMPRESSION: Gaseous distention of small bowel with little gas in the colon. Findings most compatible with small bowel obstruction. No change. Electronically Signed   By: Rolm Baptise M.D.   On: 08/21/2019 08:14   [place] EKG SITE RITE  Result Date: 08/20/2019 If Site Rite image not attached, placement could not be confirmed due to current cardiac rhythm.   Anti-infectives: Anti-infectives (From admission, onward)   Start     Dose/Rate Route Frequency Ordered Stop   08/15/19 1600  piperacillin-tazobactam (ZOSYN) IVPB  3.375 g     3.375 g 12.5 mL/hr over 240 Minutes Intravenous Every 8 hours 08/15/19 1458     08/13/19 1700  cefTRIAXone (ROCEPHIN) 2 g in sodium chloride 0.9 % 100 mL IVPB  Status:  Discontinued     2 g 200 mL/hr over 30 Minutes Intravenous Every 24 hours 08/12/19 2222 08/15/19 1458   08/12/19 2300  metroNIDAZOLE (FLAGYL) IVPB 500 mg  Status:  Discontinued     500 mg 100 mL/hr over 60 Minutes Intravenous Every 8 hours 08/12/19 2222 08/15/19 1458   08/12/19 1645  cefTRIAXone (ROCEPHIN) 2 g in sodium chloride 0.9 % 100 mL IVPB     2 g 200 mL/hr over 30 Minutes Intravenous  Once 08/12/19 1636 08/12/19 1800   08/12/19 1645  metroNIDAZOLE (FLAGYL) IVPB 500 mg     500 mg 100 mL/hr over 60 Minutes Intravenous  Once 08/12/19 1636 08/12/19 1804      Assessment/Plan: Perforated acute appendicitis with peritoneal abscess POD10,S/plaparoscopic appendectomy, Dr. Georgette Dover, 08/12/2019 -CT 5/4 w/out abscess - Prolonged ileus. Improving.tol clamping, will try to remove ng today, stay on clears with supplements for now - If fails, will need PICC and TPN - Cont to mobilize  FEN -clears, ng out today VTE -SCDs, Lovenox ID -Rocephin/Flagyl 4/27- 4/30. Zosyn 4/30>> WBCnormal now, afebrile, no more abx needed  Rolm Bookbinder 08/22/2019

## 2019-08-23 MED ORDER — ENSURE ENLIVE PO LIQD
237.0000 mL | Freq: Three times a day (TID) | ORAL | Status: DC
  Administered 2019-08-23: 237 mL via ORAL

## 2019-08-23 NOTE — Progress Notes (Signed)
Progress Note: General Surgery Service   Chief Complaint/Subjective: +BM overnight, tolerating liquids, continued bloating feelings  Objective: Vital signs in last 24 hours: Temp:  [97.7 F (36.5 C)-98.5 F (36.9 C)] 98.5 F (36.9 C) (05/08 0814) Pulse Rate:  [71-81] 77 (05/08 0814) Resp:  [15-17] 17 (05/08 0814) BP: (127-142)/(79-87) 136/87 (05/08 0814) SpO2:  [95 %-100 %] 95 % (05/08 0814) Last BM Date: 08/22/19  Intake/Output from previous day: 05/07 0701 - 05/08 0700 In: 2037.5 [P.O.:960; I.V.:1077.5] Out: 1350 [Urine:1350] Intake/Output this shift: No intake/output data recorded.  Gen: NAD  Resp: nonlabored  Card: RRR  Abd: soft, slight distension, nontender  Lab Results: CBC  Recent Labs    08/21/19 1604  WBC 9.7  HGB 12.7*  HCT 40.1  PLT 369   BMET No results for input(s): NA, K, CL, CO2, GLUCOSE, BUN, CREATININE, CALCIUM in the last 72 hours. PT/INR No results for input(s): LABPROT, INR in the last 72 hours. ABG No results for input(s): PHART, HCO3 in the last 72 hours.  Invalid input(s): PCO2, PO2  Anti-infectives: Anti-infectives (From admission, onward)   Start     Dose/Rate Route Frequency Ordered Stop   08/15/19 1600  piperacillin-tazobactam (ZOSYN) IVPB 3.375 g  Status:  Discontinued     3.375 g 12.5 mL/hr over 240 Minutes Intravenous Every 8 hours 08/15/19 1458 08/22/19 0927   08/13/19 1700  cefTRIAXone (ROCEPHIN) 2 g in sodium chloride 0.9 % 100 mL IVPB  Status:  Discontinued     2 g 200 mL/hr over 30 Minutes Intravenous Every 24 hours 08/12/19 2222 08/15/19 1458   08/12/19 2300  metroNIDAZOLE (FLAGYL) IVPB 500 mg  Status:  Discontinued     500 mg 100 mL/hr over 60 Minutes Intravenous Every 8 hours 08/12/19 2222 08/15/19 1458   08/12/19 1645  cefTRIAXone (ROCEPHIN) 2 g in sodium chloride 0.9 % 100 mL IVPB     2 g 200 mL/hr over 30 Minutes Intravenous  Once 08/12/19 1636 08/12/19 1800   08/12/19 1645  metroNIDAZOLE (FLAGYL) IVPB 500 mg      500 mg 100 mL/hr over 60 Minutes Intravenous  Once 08/12/19 1636 08/12/19 1804      Medications: Scheduled Meds: . enoxaparin (LOVENOX) injection  40 mg Subcutaneous Q24H  . feeding supplement  1 Container Oral TID BM  . feeding supplement (ENSURE ENLIVE)  237 mL Oral BID BM  . multivitamin with minerals  1 tablet Oral Daily  . sodium chloride flush  10-40 mL Intracatheter Q12H   Continuous Infusions: . 0.9 % NaCl with KCl 20 mEq / L 35 mL/hr at 08/23/19 0752   PRN Meds:.acetaminophen **OR** acetaminophen, diphenhydrAMINE **OR** diphenhydrAMINE, morphine injection, ondansetron **OR** ondansetron (ZOFRAN) IV, promethazine, sodium chloride flush  Assessment/Plan: s/p Procedure(s): APPENDECTOMY LAPAROSCOPIC 08/12/2019 -CT 5/4 w/out abscess - Prolonged ileus now with bowel function -Cont to mobilize FEN -fulls and ensure today VTE -SCDs, Lovenox ID -Rocephin/Flagyl 4/27- 4/30. Zosyn 4/30>> WBCnormal now, afebrile, no more abx needed    LOS: 11 days   Mickeal Skinner, MD [facility]

## 2019-08-23 NOTE — Plan of Care (Signed)
  Problem: Education: Goal: Knowledge of General Education information will improve Description: Including pain rating scale, medication(s)/side effects and non-pharmacologic comfort measures Outcome: Progressing   Problem: Health Behavior/Discharge Planning: Goal: Ability to manage health-related needs will improve Outcome: Progressing   Problem: Activity: Goal: Risk for activity intolerance will decrease Outcome: Progressing   Problem: Nutrition: Goal: Adequate nutrition will be maintained Outcome: Progressing   Problem: Elimination: Goal: Will not experience complications related to bowel motility Outcome: Progressing   Problem: Pain Managment: Goal: General experience of comfort will improve Outcome: Progressing   

## 2019-08-23 NOTE — Plan of Care (Signed)

## 2019-08-24 NOTE — Progress Notes (Signed)
Progress Note: General Surgery Service   Chief Complaint/Subjective: Vomited 2 times lastnight. Had bowel movements before and after vomiting over 2 hour period  Objective: Vital signs in last 24 hours: Temp:  [98.1 F (36.7 C)-98.6 F (37 C)] 98.1 F (36.7 C) (05/09 0939) Pulse Rate:  [88-96] 96 (05/09 0939) Resp:  [17-19] 18 (05/09 0939) BP: (130-137)/(79-94) 135/94 (05/09 0939) SpO2:  [94 %-99 %] 96 % (05/09 0939) Last BM Date: 08/22/19  Intake/Output from previous day: 05/08 0701 - 05/09 0700 In: 365.4 [I.V.:365.4] Out: -  Intake/Output this shift: No intake/output data recorded.  Gen: NAD  Resp: nonlabored  Card: RRR  Abd: soft, slight distension, nontender, incisions c/d/i  Lab Results: CBC  Recent Labs    08/21/19 1604  WBC 9.7  HGB 12.7*  HCT 40.1  PLT 369   BMET No results for input(s): NA, K, CL, CO2, GLUCOSE, BUN, CREATININE, CALCIUM in the last 72 hours. PT/INR No results for input(s): LABPROT, INR in the last 72 hours. ABG No results for input(s): PHART, HCO3 in the last 72 hours.  Invalid input(s): PCO2, PO2  Anti-infectives: Anti-infectives (From admission, onward)   Start     Dose/Rate Route Frequency Ordered Stop   08/15/19 1600  piperacillin-tazobactam (ZOSYN) IVPB 3.375 g  Status:  Discontinued     3.375 g 12.5 mL/hr over 240 Minutes Intravenous Every 8 hours 08/15/19 1458 08/22/19 0927   08/13/19 1700  cefTRIAXone (ROCEPHIN) 2 g in sodium chloride 0.9 % 100 mL IVPB  Status:  Discontinued     2 g 200 mL/hr over 30 Minutes Intravenous Every 24 hours 08/12/19 2222 08/15/19 1458   08/12/19 2300  metroNIDAZOLE (FLAGYL) IVPB 500 mg  Status:  Discontinued     500 mg 100 mL/hr over 60 Minutes Intravenous Every 8 hours 08/12/19 2222 08/15/19 1458   08/12/19 1645  cefTRIAXone (ROCEPHIN) 2 g in sodium chloride 0.9 % 100 mL IVPB     2 g 200 mL/hr over 30 Minutes Intravenous  Once 08/12/19 1636 08/12/19 1800   08/12/19 1645  metroNIDAZOLE  (FLAGYL) IVPB 500 mg     500 mg 100 mL/hr over 60 Minutes Intravenous  Once 08/12/19 1636 08/12/19 1804      Medications: Scheduled Meds: . enoxaparin (LOVENOX) injection  40 mg Subcutaneous Q24H  . feeding supplement (ENSURE ENLIVE)  237 mL Oral TID BM  . multivitamin with minerals  1 tablet Oral Daily  . sodium chloride flush  10-40 mL Intracatheter Q12H   Continuous Infusions: . 0.9 % NaCl with KCl 20 mEq / L 35 mL/hr at 08/24/19 0248   PRN Meds:.acetaminophen **OR** acetaminophen, diphenhydrAMINE **OR** diphenhydrAMINE, morphine injection, ondansetron **OR** ondansetron (ZOFRAN) IV, promethazine, sodium chloride flush  Assessment/Plan: s/p Procedure(s): APPENDECTOMY LAPAROSCOPIC 08/12/2019 -CT 5/4 w/out abscess - Prolonged ileus now with bowel function -Cont to mobilize FEN -fulls and ensure today but encouraged to hold off on most intake today VTE -SCDs, Lovenox ID -Rocephin/Flagyl 4/27- 4/30. Zosyn 4/30>> WBCnormal now, afebrile, no more abx needed  Dispo - inpatient for ileus, labs tomorrow    LOS: 12 days   Mickeal Skinner, MD [facility]

## 2019-08-24 NOTE — Plan of Care (Signed)

## 2019-08-24 NOTE — Plan of Care (Signed)

## 2019-08-25 LAB — BASIC METABOLIC PANEL
Anion gap: 7 (ref 5–15)
BUN: 15 mg/dL (ref 8–23)
CO2: 20 mmol/L — ABNORMAL LOW (ref 22–32)
Calcium: 8.5 mg/dL — ABNORMAL LOW (ref 8.9–10.3)
Chloride: 112 mmol/L — ABNORMAL HIGH (ref 98–111)
Creatinine, Ser: 0.83 mg/dL (ref 0.61–1.24)
GFR calc Af Amer: >60 mL/min (ref >60)
GFR calc non Af Amer: >60 mL/min (ref >60)
Glucose, Bld: 87 mg/dL (ref 70–99)
Potassium: 4.1 mmol/L (ref 3.5–5.1)
Sodium: 139 mmol/L (ref 135–145)

## 2019-08-25 LAB — CBC
HCT: 34.2 % — ABNORMAL LOW (ref 39.0–52.0)
Hemoglobin: 10.9 g/dL — ABNORMAL LOW (ref 13.0–17.0)
MCH: 27.6 pg (ref 26.0–34.0)
MCHC: 31.9 g/dL (ref 30.0–36.0)
MCV: 86.6 fL (ref 80.0–100.0)
Platelets: 284 10*3/uL (ref 150–400)
RBC: 3.95 MIL/uL — ABNORMAL LOW (ref 4.22–5.81)
RDW: 13.7 % (ref 11.5–15.5)
WBC: 6.3 10*3/uL (ref 4.0–10.5)
nRBC: 0 % (ref 0.0–0.2)

## 2019-08-25 MED ORDER — OXYCODONE HCL 5 MG PO TABS: 5.0000 mg | ORAL_TABLET | ORAL | Status: DC | PRN

## 2019-08-25 NOTE — Progress Notes (Signed)
Nutrition Follow-up  DOCUMENTATION CODES:   Not applicable  INTERVENTION:   -D/c Ensure Enlive due to poor acceptance -Magic cup BID with meals, each supplement provides 290 kcal and 9 grams of protein -Continue MVI with minerals daily  NUTRITION DIAGNOSIS:   Inadequate oral intake related to altered GI function as evidenced by NPO status.  Progressing; advanced to soft diet on 08/25/19  GOAL:   Patient will meet greater than or equal to 90% of their needs  Progressing   MONITOR:   Diet advancement, Labs, Weight trends, Skin, I & O's  REASON FOR ASSESSMENT:   NPO/Clear Liquid Diet, Consult New TPN/TNA  ASSESSMENT:   69 year old male who presents with a week of lower abdominal pain that has now localized to the RLQ.  He was febrile up to 101.3 with some diarrhea, but no nausea or vomiting.  He came to the ED for evaluation.  CT scan showed acute appendicitis without sign of perforation, but significant stranding.  4/27- s/p lap appendectomy 4/29- NGT placed 5/4- CT without evidence of abscess 5/6- advanced to clear liquid diet 5/7- NGT d/c 5/8- advanced to full liquid diet 5/10- advanced to soft diet  Reviewed I/O's: +2.3 L x 24 hours and -818 ml since admission  Attempted to speak with pt via phone, however, no answer.   Per MD notes, pt tolerating full liquid diet well. Noted meal completion 100%. Pt is refusing Ensure supplements. He was just advanced to a soft diet.   Labs reviewed.    Diet Order:   Diet Order            DIET SOFT Room service appropriate? Yes; Fluid consistency: Thin  Diet effective now              EDUCATION NEEDS:   No education needs have been identified at this time  Skin:  Skin Assessment: Skin Integrity Issues: Skin Integrity Issues:: Incisions Incisions: closed abdomen  Last BM:  08/24/19  Height:   Ht Readings from Last 1 Encounters:  08/12/19 5\' 11"  (1.803 m)    Weight:   Wt Readings from Last 1 Encounters:   08/12/19 88.5 kg    Ideal Body Weight:  78.2 kg  BMI:  Body mass index is 27.2 kg/m.  Estimated Nutritional Needs:   Kcal:  2150-2350  Protein:  115-130 grams  Fluid:  > 2.1 L    Loistine Chance, RD, LDN, [place] Registered Dietitian II Certified Diabetes Care and Education Specialist Please refer to [facility] for RD and/or RD on-call/weekend/after hours pager

## 2019-08-25 NOTE — Plan of Care (Signed)
  Problem: Activity: Goal: Risk for activity intolerance will decrease Outcome: Progressing   Problem: Nutrition: Goal: Adequate nutrition will be maintained Outcome: Progressing   

## 2019-08-25 NOTE — Progress Notes (Signed)
13 Days Post-Op  Subjective: CC: Doing well. Tolerating FLD trays without any n/v yesterday. Passing flatus. Soft BM's x 4 yesterday. Mobilizing.  Patients son at bedside.   Objective: Vital signs in last 24 hours: Temp:  [98.1 F (36.7 C)-99.4 F (37.4 C)] 98.7 F (37.1 C) (05/10 0818) Pulse Rate:  [82-98] 98 (05/10 0818) Resp:  [16-19] 18 (05/10 0818) BP: (125-143)/(75-94) 143/92 (05/10 0818) SpO2:  [95 %-98 %] 95 % (05/10 0818) Last BM Date: 08/24/19  Intake/Output from previous day: 05/09 0701 - 05/10 0700 In: 2288.5 [P.O.:960; I.V.:1328.5] Out: -  Intake/Output this shift: No intake/output data recorded.  PE: Gen:  Alert, NAD, pleasant Card:  RRR, no M/G/R heard Pulm:  CTAB, no W/R/R, effort normal Abd: Soft, NT, mild distension, +BS, laparoscopic incisions c/d/i with steristrips in place.  Ext:  1+ LE edema b/l. Calves equal in size. No calf tenderness Psych: A&Ox3  Skin: no rashes noted, warm and dry   Lab Results:  Recent Labs    08/25/19 0750  WBC 6.3  HGB 10.9*  HCT 34.2*  PLT 284   BMET No results for input(s): NA, K, CL, CO2, GLUCOSE, BUN, CREATININE, CALCIUM in the last 72 hours. PT/INR No results for input(s): LABPROT, INR in the last 72 hours. CMP     Component Value Date/Time   NA 146 (H) 08/18/2019 0538   K 4.4 08/18/2019 0538   CL 114 (H) 08/18/2019 0538   CO2 24 08/18/2019 0538   GLUCOSE 102 (H) 08/18/2019 0538   GLUCOSE 82 03/22/2006 0834   BUN 23 08/18/2019 0538   CREATININE 0.97 08/18/2019 0538   CALCIUM 8.5 (L) 08/18/2019 0538   PROT 6.8 08/12/2019 0955   ALBUMIN 3.3 (L) 08/12/2019 0955   AST 26 08/12/2019 0955   ALT 34 08/12/2019 0955   ALKPHOS 79 08/12/2019 0955   BILITOT 0.8 08/12/2019 0955   GFRNONAA >60 08/18/2019 0538   GFRAA >60 08/18/2019 0538   Lipase     Component Value Date/Time   LIPASE 23 08/12/2019 0955       Studies/Results: No results found.  Anti-infectives: Anti-infectives (From admission,  onward)   Start     Dose/Rate Route Frequency Ordered Stop   08/15/19 1600  piperacillin-tazobactam (ZOSYN) IVPB 3.375 g  Status:  Discontinued     3.375 g 12.5 mL/hr over 240 Minutes Intravenous Every 8 hours 08/15/19 1458 08/22/19 0927   08/13/19 1700  cefTRIAXone (ROCEPHIN) 2 g in sodium chloride 0.9 % 100 mL IVPB  Status:  Discontinued     2 g 200 mL/hr over 30 Minutes Intravenous Every 24 hours 08/12/19 2222 08/15/19 1458   08/12/19 2300  metroNIDAZOLE (FLAGYL) IVPB 500 mg  Status:  Discontinued     500 mg 100 mL/hr over 60 Minutes Intravenous Every 8 hours 08/12/19 2222 08/15/19 1458   08/12/19 1645  cefTRIAXone (ROCEPHIN) 2 g in sodium chloride 0.9 % 100 mL IVPB     2 g 200 mL/hr over 30 Minutes Intravenous  Once 08/12/19 1636 08/12/19 1800   08/12/19 1645  metroNIDAZOLE (FLAGYL) IVPB 500 mg     500 mg 100 mL/hr over 60 Minutes Intravenous  Once 08/12/19 1636 08/12/19 1804       Assessment/Plan Perforated acute appendicitis with peritoneal abscess POD13,S/plaparoscopic appendectomy, Dr. Georgette Dover, 08/12/2019 -CT 5/4 w/out abscess - Adv to soft diet - D/c IVF - Mobilize, IS  FEN -Soft  VTE -SCDs, Lovenox ID -Rocephin/Flagyl 4/27- 4/30. Zosyn 4/30- 5/7 9.7  LOS: 13 days    Jillyn Ledger , [facility] 08/25/2019, 8:57 AM Please see Amion for pager number during day hours 7:00am-4:30pm

## 2019-08-26 ENCOUNTER — Telehealth: Payer: Self-pay

## 2019-08-26 ENCOUNTER — Inpatient Hospital Stay (HOSPITAL_COMMUNITY): Payer: Medicare HMO

## 2019-08-26 DIAGNOSIS — R609 Edema, unspecified: Secondary | ICD-10-CM

## 2019-08-26 LAB — URINALYSIS, COMPLETE (UACMP) WITH MICROSCOPIC
Bacteria, UA: NONE SEEN
Bilirubin Urine: NEGATIVE
Glucose, UA: NEGATIVE mg/dL
Hgb urine dipstick: NEGATIVE
Ketones, ur: NEGATIVE mg/dL
Leukocytes,Ua: NEGATIVE
Nitrite: NEGATIVE
Protein, ur: NEGATIVE mg/dL
Specific Gravity, Urine: 1.018 (ref 1.005–1.030)
pH: 6 (ref 5.0–8.0)

## 2019-08-26 LAB — BASIC METABOLIC PANEL
Anion gap: 10 (ref 5–15)
BUN: 11 mg/dL (ref 8–23)
CO2: 23 mmol/L (ref 22–32)
Calcium: 8.8 mg/dL — ABNORMAL LOW (ref 8.9–10.3)
Chloride: 104 mmol/L (ref 98–111)
Creatinine, Ser: 0.88 mg/dL (ref 0.61–1.24)
GFR calc Af Amer: >60 mL/min (ref >60)
GFR calc non Af Amer: >60 mL/min (ref >60)
Glucose, Bld: 97 mg/dL (ref 70–99)
Potassium: 4.2 mmol/L (ref 3.5–5.1)
Sodium: 137 mmol/L (ref 135–145)

## 2019-08-26 LAB — CBC
HCT: 38.1 % — ABNORMAL LOW (ref 39.0–52.0)
Hemoglobin: 12.2 g/dL — ABNORMAL LOW (ref 13.0–17.0)
MCH: 27.7 pg (ref 26.0–34.0)
MCHC: 32 g/dL (ref 30.0–36.0)
MCV: 86.6 fL (ref 80.0–100.0)
Platelets: 292 10*3/uL (ref 150–400)
RBC: 4.4 MIL/uL (ref 4.22–5.81)
RDW: 14.1 % (ref 11.5–15.5)
WBC: 14.6 10*3/uL — ABNORMAL HIGH (ref 4.0–10.5)
nRBC: 0 % (ref 0.0–0.2)

## 2019-08-26 MED ORDER — ENOXAPARIN SODIUM 100 MG/ML ~~LOC~~ SOLN
90.0000 mg | Freq: Two times a day (BID) | SUBCUTANEOUS | Status: DC
  Administered 2019-08-26 – 2019-08-27 (×2): 90 mg via SUBCUTANEOUS
  Filled 2019-08-26 (×3): qty 0.9

## 2019-08-26 MED ORDER — IOHEXOL 300 MG/ML  SOLN
100.0000 mL | Freq: Once | INTRAMUSCULAR | Status: AC | PRN
  Administered 2019-08-26: 100 mL via INTRAVENOUS

## 2019-08-26 NOTE — Telephone Encounter (Signed)
Patient l/m asking for Dr Damita Dunnings or at least his CMA to call him back. He wanted to let Dr Damita Dunnings know that he is still in the hospital and wanted to let him know what all has been going on while there. Patient also asked if Dr Damita Dunnings could come to see him at the hospital-his room is [facility] [address]. Patient said if both things could happen that would be great. Spoke with Dr Damita Dunnings and he asked me to send him this note. CB to patient is [phone]

## 2019-08-26 NOTE — Progress Notes (Addendum)
   08/26/19 0113  Assess: MEWS Score  Temp (!) 102.1 F (38.9 C)  BP 126/71  Pulse Rate (!) 109  Resp 20  Level of Consciousness Alert  SpO2 95 %  O2 Device Room Air  Assess: MEWS Score  MEWS Temp 2  MEWS Systolic 0  MEWS Pulse 1  MEWS RR 0  MEWS LOC 0  MEWS Score 3  MEWS Score Color Yellow  Assess: if the MEWS score is Yellow or Red  Were vital signs taken at a resting state? Yes  Focused Assessment Documented focused assessment  Early Detection of Sepsis Score *See Row Information* Low  MEWS guidelines implemented *See Row Information* Yes  Treat  MEWS Interventions Escalated (See documentation below)  Take Vital Signs  Increase Vital Sign Frequency  Yellow: Q 2hr X 2 then Q 4hr X 2, if remains yellow, continue Q 4hrs  Escalate  MEWS: Escalate Yellow: discuss with charge nurse/RN and consider discussing with provider and RRT  Notify: Charge Nurse/RN  Name of Charge Nurse/RN Notified Blanch Media  Date Charge Nurse/RN Notified 08/26/19  Time Charge Nurse/RN Notified 0135  Notify: Provider  Provider Name/Title Lovick   Date Provider Notified 08/26/19  Time Provider Notified 0128  Notification Type Page  Notification Reason Change in status  Response No new orders  Date of Provider Response 08/26/19  Time of Provider Response 0148  Notify: Rapid Response  Name of Rapid Response RN Notified n/a   2245 Patient notified nurse of having chills and wanting his temperature taken 2248 Temp 101.4 PRN tylenol administered   0113 Temp 102.1 despite tylenol, patient also reported having a bowel movement that was "just water" patient no longer feels like he has chills but still has a temperature of 102.1 On call LOvick MD notified and returned page via call at [place], advised to continue to monitor patient's temp.   Increased vital signs to Q2hr x 2 then Q4hr x 2  Will continue to monitor

## 2019-08-26 NOTE — Progress Notes (Signed)
ANTICOAGULATION CONSULT NOTE - Initial Consult  Pharmacy Consult for Lovenox Indication: bilateral DVT  No Known Allergies  Patient Measurements: Height: 5\' 11"  (180.3 cm) Weight: 88.5 kg (195 lb) IBW/kg (Calculated) : 75.3 Lovenox Dosing Weight: 88.5 kg  Vital Signs: Temp: 98 F (36.7 C) (05/11 1313) Temp Source: Oral (05/11 1313) BP: 117/76 (05/11 1313) Pulse Rate: 86 (05/11 1313)  Labs: Recent Labs    08/25/19 0750 08/26/19 0909  HGB 10.9* 12.2*  HCT 34.2* 38.1*  PLT 284 292  CREATININE 0.83 0.88    Estimated Creatinine Clearance: 87.9 mL/min (by C-G formula based on SCr of 0.88 mg/dL).   Medical History: Past Medical History:  Diagnosis Date  . Anemia of other chronic disease    normal blood blood counts since 2007  . Arthritis   . GERD (gastroesophageal reflux disease)   . Hemorrhoids   . Labyrinthitis   . Obstructive sleep apnea    study '07 - AHI 17, lowest O2 sat 86%. not using CPAP, couldn't tolerate CPAP  . Plantar fasciitis   . Right rotator cuff tear 2013   90% normal function.  . Rupture long head biceps tendon 2014   right. 90% normal function   Assessment:  69 yr old male to begin Lovenox for bilateral DVT per duplex today.  Has been on Lovenox 40 mg SQ daily + SCDs for VTE prophylaxis.  Last Lovenox dose ~9:30am today.  POD#14 appendectomy. CBC stable.  Goal of Therapy:  Anti-Xa level 0.6-1 units/ml 4hrs after LMWH dose given Monitor platelets by anticoagulation protocol: Yes   Plan:   Changed Lovenox to 90 mg (~1 mg/kg) SQ Q12hrs.  CBC at least every 3 days while on Lovenox.  Follow up oral anticoagulation plans.    Arty Baumgartner,  Phone: [phone] 08/26/2019,4:02 PM

## 2019-08-26 NOTE — Progress Notes (Signed)
Lower venous duplex       has been completed. Preliminary results can be found under CV proc through chart review. Salsabeel Gorelick, BS, RDMS, RVT   

## 2019-08-26 NOTE — Progress Notes (Signed)
14 Days Post-Op  Subjective: CC: Patient reports that yesterday for lunch she tolerated a chicken salad sandwich and fries, finishing roughly 50% without any abdominal pain, nausea or emesis.  He reports he finished his dinner last night and shortly after had a loose, watery stool.  When he returned to bed around 8 PM he began having chills as well as periumbilical abdominal pain.  He began running a fever over night. Did have some nausea that he describes as hypersalivation.  He denies any chest pain, shortness of breath, dysuria, or increased urinary frequency.  He reports his lower extremity edema has improved slightly. He feels more distended this morning.   Objective: Vital signs in last 24 hours: Temp:  [98.7 F (37.1 C)-102.1 F (38.9 C)] 98.7 F (37.1 C) (05/11 0910) Pulse Rate:  [91-109] 92 (05/11 0910) Resp:  [16-20] 16 (05/11 0910) BP: (107-159)/(69-84) 107/73 (05/11 0910) SpO2:  [95 %-99 %] 99 % (05/11 0910) Last BM Date: 08/26/19  Intake/Output from previous day: 05/10 0701 - 05/11 0700 In: 970 [P.O.:960; I.V.:10] Out: -  Intake/Output this shift: No intake/output data recorded.  PE: Gen:  Alert, NAD, pleasant Card:  RRR, no M/G/R heard Pulm:  CTAB, no W/R/R, effort normal Abd: Soft, mod distension, tenderness of the RLQ and suprapubic abdomen without rigidity or guarding. Bowel sounds hypoactive. Laparoscopic incisions c/d/i with steristrips in place and without signs of infection.  Ext:  1+ LE edema b/l. Calves equal in size. No calf tenderness Psych: A&Ox3  Skin: no rashes noted, warm and dry   Lab Results:  Recent Labs    08/25/19 0750 08/26/19 0909  WBC 6.3 14.6*  HGB 10.9* 12.2*  HCT 34.2* 38.1*  PLT 284 292   BMET Recent Labs    08/25/19 0750  NA 139  K 4.1  CL 112*  CO2 20*  GLUCOSE 87  BUN 15  CREATININE 0.83  CALCIUM 8.5*   PT/INR No results for input(s): LABPROT, INR in the last 72 hours. CMP     Component Value Date/Time    NA 139 08/25/2019 0750   K 4.1 08/25/2019 0750   CL 112 (H) 08/25/2019 0750   CO2 20 (L) 08/25/2019 0750   GLUCOSE 87 08/25/2019 0750   GLUCOSE 82 03/22/2006 0834   BUN 15 08/25/2019 0750   CREATININE 0.83 08/25/2019 0750   CALCIUM 8.5 (L) 08/25/2019 0750   PROT 6.8 08/12/2019 0955   ALBUMIN 3.3 (L) 08/12/2019 0955   AST 26 08/12/2019 0955   ALT 34 08/12/2019 0955   ALKPHOS 79 08/12/2019 0955   BILITOT 0.8 08/12/2019 0955   GFRNONAA >60 08/25/2019 0750   GFRAA >60 08/25/2019 0750   Lipase     Component Value Date/Time   LIPASE 23 08/12/2019 0955       Studies/Results: No results found.  Anti-infectives: Anti-infectives (From admission, onward)   Start     Dose/Rate Route Frequency Ordered Stop   08/15/19 1600  piperacillin-tazobactam (ZOSYN) IVPB 3.375 g  Status:  Discontinued     3.375 g 12.5 mL/hr over 240 Minutes Intravenous Every 8 hours 08/15/19 1458 08/22/19 0927   08/13/19 1700  cefTRIAXone (ROCEPHIN) 2 g in sodium chloride 0.9 % 100 mL IVPB  Status:  Discontinued     2 g 200 mL/hr over 30 Minutes Intravenous Every 24 hours 08/12/19 2222 08/15/19 1458   08/12/19 2300  metroNIDAZOLE (FLAGYL) IVPB 500 mg  Status:  Discontinued     500 mg 100 mL/hr  over 60 Minutes Intravenous Every 8 hours 08/12/19 2222 08/15/19 1458   08/12/19 1645  cefTRIAXone (ROCEPHIN) 2 g in sodium chloride 0.9 % 100 mL IVPB     2 g 200 mL/hr over 30 Minutes Intravenous  Once 08/12/19 1636 08/12/19 1800   08/12/19 1645  metroNIDAZOLE (FLAGYL) IVPB 500 mg     500 mg 100 mL/hr over 60 Minutes Intravenous  Once 08/12/19 1636 08/12/19 1804       Assessment/Plan LE Edema - Check LE [place] and CXR  Perforated acute appendicitis with peritoneal abscess POD14,S/plaparoscopic appendectomy, Dr. Georgette Dover, 08/12/2019 -CT 5/4 w/out abscess - Febrile overnight. WBC up at 14.6. Repeat CT A/P today.  - Mobilize, IS  FEN -Soft  VTE -SCDs, Lovenox ID -Rocephin/Flagyl 4/27- 4/30. Zosyn 4/30-  5/7. Febrile to 102.1. WBC 14.6 (CT A/P, CXR, UA, LE [place])   LOS: 14 days    Jillyn Ledger , [facility] 08/26/2019, 9:59 AM Please see Amion for pager number during day hours 7:00am-4:30pm

## 2019-08-26 NOTE — Plan of Care (Signed)

## 2019-08-27 ENCOUNTER — Telehealth: Payer: Self-pay

## 2019-08-27 DIAGNOSIS — Z86718 Personal history of other venous thrombosis and embolism: Secondary | ICD-10-CM

## 2019-08-27 LAB — CBC
HCT: 33.6 % — ABNORMAL LOW (ref 39.0–52.0)
Hemoglobin: 10.8 g/dL — ABNORMAL LOW (ref 13.0–17.0)
MCH: 27.8 pg (ref 26.0–34.0)
MCHC: 32.1 g/dL (ref 30.0–36.0)
MCV: 86.4 fL (ref 80.0–100.0)
Platelets: 262 10*3/uL (ref 150–400)
RBC: 3.89 MIL/uL — ABNORMAL LOW (ref 4.22–5.81)
RDW: 13.9 % (ref 11.5–15.5)
WBC: 5.7 10*3/uL (ref 4.0–10.5)
nRBC: 0 % (ref 0.0–0.2)

## 2019-08-27 LAB — BASIC METABOLIC PANEL
Anion gap: 9 (ref 5–15)
BUN: 15 mg/dL (ref 8–23)
CO2: 22 mmol/L (ref 22–32)
Calcium: 8.3 mg/dL — ABNORMAL LOW (ref 8.9–10.3)
Chloride: 106 mmol/L (ref 98–111)
Creatinine, Ser: 0.81 mg/dL (ref 0.61–1.24)
GFR calc Af Amer: >60 mL/min (ref >60)
GFR calc non Af Amer: >60 mL/min (ref >60)
Glucose, Bld: 86 mg/dL (ref 70–99)
Potassium: 4 mmol/L (ref 3.5–5.1)
Sodium: 137 mmol/L (ref 135–145)

## 2019-08-27 MED ORDER — ONDANSETRON 4 MG PO TBDP: 4.0000 mg | ORAL_TABLET | Freq: Four times a day (QID) | ORAL | 0 refills | Status: DC | PRN

## 2019-08-27 MED ORDER — ACETAMINOPHEN 325 MG PO TABS: 650.0000 mg | ORAL_TABLET | Freq: Four times a day (QID) | ORAL | Status: DC | PRN

## 2019-08-27 MED ORDER — RIVAROXABAN (XARELTO) EDUCATION KIT FOR DVT/PE PATIENTS: PACK | 0 refills | Status: DC

## 2019-08-27 MED ORDER — RIVAROXABAN (XARELTO) VTE STARTER PACK (15 & 20 MG)
ORAL_TABLET | ORAL | 0 refills | Status: DC
Start: 2019-08-27 — End: 2019-09-02

## 2019-08-27 MED ORDER — RIVAROXABAN (XARELTO) EDUCATION KIT FOR DVT/PE PATIENTS
PACK | Freq: Once | Status: DC
  Filled 2019-08-27: qty 1

## 2019-08-27 MED ORDER — ADULT MULTIVITAMIN W/MINERALS CH: 1.0000 | ORAL_TABLET | Freq: Every day | ORAL | Status: DC

## 2019-08-27 MED ORDER — OXYCODONE HCL 5 MG PO TABS: 5.0000 mg | ORAL_TABLET | Freq: Four times a day (QID) | ORAL | 0 refills | Status: DC | PRN

## 2019-08-27 NOTE — Plan of Care (Signed)

## 2019-08-27 NOTE — Discharge Summary (Signed)
Patient ID: Joel Lopez [id number] 19-Jan-1951 69 y.o.  Admit date: 08/12/2019 Discharge date: 08/27/2019  Admitting Diagnosis: Acute Appendicitis   Discharge Diagnosis Patient Active Problem List   Diagnosis Date Noted  . DVT (deep venous thrombosis) ([place]) 08/27/2019  . Acute appendicitis with appendiceal abscess 08/12/2019  . Acute pain of left knee 09/19/2018  . Right hand pain 02/17/2018  . Advance care planning 01/15/2015  . Incomplete tear of rotator cuff 08/17/2011  . Routine general medical examination at a health care facility 01/11/2011  . ARTHRITIS, LEFT KNEE 10/09/2007  . Anemia of other chronic disease 09/18/2007  . GERD 09/18/2007   Consultants None   Procedures Dr. Georgette Dover - Laparoscopic Appendectomy - 08/12/2019  Hospital Course:  Patient presented with 1 week of lower abdominal pain that localized to the right lower quadrant and was associated with fever and diarrhea.  His CT scan showed acute appendicitis without perforation.  He was admitted to the general surgery service and taken to the operating room by Dr. Georgette Dover on 4/27 for laparoscopic appendectomy.  Intraoperatively patient was noted to have perforated appendicitis with peritoneal abscess. Patient tolerated the procedure well was transferred back to the floor.  He remained on IV antibiotics.  Patient developed an ileus postoperatively that required NG tube and bowel rest for several days.  He was placed on TPN.  On POD 7, CT was obtained that showed ileus-like pattern without evidence of abscess.  Over the next several days the patients ileus improved and NG tube was removed.  Diet was advanced and tolerated. TPN was weaned off. Abx were stopped after 10d course. On the night of 5/10 patient developed a fever of 102.1.  He underwent work-up for this that showed no evidence of intra-abdominal abscess.  Chest x-ray was negative. UA negative. Lower extremity ultrasounds that show bilateral DVTs.  He was  started on Lovenox and transitioned to Xarelto at the time of discharge.  Xarelto voucher with prior added for the patient. Pharmacy provided education on taking Xarelto prior to discharge. On POD 15, the patient was voiding well, tolerating diet, ambulating well, pain well controlled, vital signs stable, wbc normalized, afebrile, incisions c/d/i and felt stable for discharge home. Patient to follow up with PCP for DVT's. Follow-up arranged in the office.   Physical Exam: Gen: Alert, NAD, pleasant Card: RRR Pulm: CTAB, no W/R/R, effort normal Abd: Soft, mild to no distension, NT without rigidity or guarding. +BS. Laparoscopic incisions c/d/i and without signs of infection. Steri-strips removed. Ext:1+ LE edema b/l. Calves equal in size. No calf tenderness Psych: A&Ox3  Skin: no rashes noted, warm and dry  Allergies as of 08/27/2019   No Known Allergies     Medication List    TAKE these medications   acetaminophen 325 MG tablet Commonly known as: TYLENOL Take 2 tablets (650 mg total) by mouth every 6 (six) hours as needed for mild pain or fever.   multivitamin with minerals Tabs tablet Take 1 tablet by mouth daily. Start taking on: Aug 28, 2019   ondansetron 4 MG disintegrating tablet Commonly known as: ZOFRAN-ODT Take 1 tablet (4 mg total) by mouth every 6 (six) hours as needed for nausea.   oxyCODONE 5 MG immediate release tablet Commonly known as: Oxy IR/ROXICODONE Take 1 tablet (5 mg total) by mouth every 6 (six) hours as needed for breakthrough pain.   rivaroxaban Kit Commonly known as: XARELTO 1 Kit   Rivaroxaban Stater Pack (15 mg and 20 mg)  Commonly known as: Dollene Primrose PACK Follow package directions: Take one 11m tablet by mouth twice a day. On day 22, switch to one 218mtablet once a day. Take with food.        Follow-up Information    TsDonnie MesaMD. Go on 09/18/2019.   Specialty: General Surgery Why: @ 3pm. Please arrive 30 minutes early for  paperwork. Please bring a copy of your photo ID and insurance card to the appointment.  Contact information: [phone]        DuTonia GhentMD. Call in 1 day(s).   Specialty: Family Medicine Why: Please call the day of discharge to arrange a follow up for your DVTs (blood clots) that were found in your legs while you were in the hospital Contact information: [phone]           Signed: MiAlferd ApaPASouth [facility] 08/27/2019, 9:48 AM Please see Amion for pager number during day hours 7:00am-4:30pm

## 2019-08-27 NOTE — Progress Notes (Signed)
Discharge instructions addressed;pt in stable condition;pt.'s sister in room and will be his ride home.

## 2019-08-27 NOTE — Telephone Encounter (Signed)
Late entry.  I called patient last night.  He and his son wore in the hospital room and they both talked to me.  I appreciated him taking the call and they thanked me for calling.  The inpatient team had been sending me his records and I let him know about that.  I saw his history of appendicitis and his recent DVT.  We talked about his management in general.  He did have a fever the night prior to our phone call.  The inpatient team is aware.  If he has another fever or new symptoms/worsening symptoms, then I want him to update the inpatient team.  I am still optimistic that he will improve in the next few days.  I wished him well and I appreciate the help of all involved.  I will await further recommendations from the inpatient team and the patient is aware.

## 2019-08-27 NOTE — Telephone Encounter (Signed)
1st attempt- Left HIPAA complaint voicemail asking patient to return my call- Need to complete TCM.

## 2019-08-27 NOTE — TOC Transition Note (Signed)
Transition of Care (TOC) - CM/SW Discharge Note   Patient Details  Name: Joel Lopez MRN: [id number] Date of Birth: 01/01/1951  Transition of Care (TOC) CM/SW Contact:  Cole, Angela Hudson, RN Phone Number: [phone] 08/27/2019, 11:53 AM   Clinical Narrative:    Admitted with perforated acute appendicitis with peritoneal abscess.  -S/plaparoscopic appendectomy,  08/12/2019 Pt will transition to home today. Xarelto kit filled and delivered to pt's bedside by TOC pharmacy.  Wife to provide transportation to home. Post hospital f/u appointments noted on AVS.    Barriers to Discharge: No Barriers Identified   Patient Goals and CMS Choice   Discharge Placement     Discharge Plan and Services     Social Determinants of Health (SDOH) Interventions     Readmission Risk Interventions No flowsheet data found.     

## 2019-08-28 NOTE — Telephone Encounter (Signed)
Transition Care Management Follow-up Telephone Call  Date of discharge and from where: [facility]  How have you been since you were released from the hospital? Patient states that he is feeling much better.   Any questions or concerns? No   Items Reviewed:  Did the pt receive and understand the discharge instructions provided? Yes   Medications obtained and verified? Yes   Any new allergies since your discharge? No   Dietary orders reviewed? Yes  Do you have support at home? Yes   Functional Questionnaire: (I = Independent and D = Dependent) ADLs: I  Bathing/Dressing- I  Meal Prep- I  Eating- I  Maintaining continence- I  Transferring/Ambulation- I  Managing Meds- I  Follow up appointments reviewed:   PCP Hospital f/u appt confirmed? Yes  Scheduled to see Dr. Damita Dunnings on 08/29/2019 @ 2:15 pm.  [facility] f/u appt confirmed? N/A   Are transportation arrangements needed? No   If their condition worsens, is the pt aware to call PCP or go to the Emergency Dept.? Yes  Was the patient provided with contact information for the PCP's office or ED? Yes  Was to pt encouraged to call back with questions or concerns? Yes

## 2019-08-29 ENCOUNTER — Encounter: Payer: Self-pay | Admitting: Family Medicine

## 2019-08-29 ENCOUNTER — Other Ambulatory Visit: Payer: Self-pay

## 2019-08-29 ENCOUNTER — Ambulatory Visit (INDEPENDENT_AMBULATORY_CARE_PROVIDER_SITE_OTHER): Payer: Medicare HMO | Admitting: Family Medicine

## 2019-08-29 VITALS — BP 116/72 | HR 88 | Temp 97.5°F | Ht 71.0 in | Wt 176.4 lb

## 2019-08-29 DIAGNOSIS — K3533 Acute appendicitis with perforation and localized peritonitis, with abscess: Secondary | ICD-10-CM

## 2019-08-29 DIAGNOSIS — Z8719 Personal history of other diseases of the digestive system: Secondary | ICD-10-CM | POA: Diagnosis not present

## 2019-08-29 DIAGNOSIS — I824Y9 Acute embolism and thrombosis of unspecified deep veins of unspecified proximal lower extremity: Secondary | ICD-10-CM

## 2019-08-29 DIAGNOSIS — R29898 Other symptoms and signs involving the musculoskeletal system: Secondary | ICD-10-CM | POA: Diagnosis not present

## 2019-08-29 NOTE — Patient Instructions (Addendum)
Recheck labs at a nonfasting visit in about 1 week.   We'll call about PT.   Update me as needed.  I'll send in the follow up rx for the xarelto.   Take care.  Glad to see you.

## 2019-08-29 NOTE — Telephone Encounter (Signed)
Noted. Thanks.

## 2019-08-29 NOTE — Progress Notes (Signed)
This visit occurred during the SARS-CoV-2 public health emergency.  Safety protocols were in place, including screening questions prior to the visit, additional usage of staff PPE, and extensive cleaning of exam room while observing appropriate contact time as indicated for disinfecting solutions.  ====================== Inpatient follow-up.  Inpatient course discussed with patient.  History of appendicitis with rupture noted.  Postop course complicated by ileus and then bilateral lower extremity DVT.  He improved enough as an inpatient to be discharged.  He is tolerating p.o. diet and has been started on anticoagulation in the meantime.  Since coming home the hospital he still feels weak and it doesn't take much for him to want to go lay down.  He has bloating after eating but not usually prior to eating.  No fevers in the meantime.  + flatus and BM w/o blood in stool.   He has been treated with Xarelto in the meantime.  His lower extremity edema has resolved.  We talked about his DVT and his history.  No history of prior DVT.  No family history of clotting disorder.  This is his first event and I presume it to be related to his concurrent illness.  Discussed.  We talked about DVT pathophysiology and rationale for treatment.  Even though he had a DVT in each leg I would still count this as one episode.  Discussed with patient.  He has surgery f/u pending.    Staying with his sister in the meantime.  She is cooking for him.  His appetite is not normal but better.  He wanted to get set up with PT. he wants to get back to playing tennis at some point and we talked about a gradual return to exercise as his condition allows.  ROS: Per HPI unless specifically indicated in ROS section   Meds, vitals, and allergies reviewed.   GEN: nad, alert and oriented HEENT: ncat NECK: supple w/o LA CV: rrr. PULM: ctab, no inc wob ABD: soft, +bs, port sites are clean dry and intact and appear to be healing.   Abdomen not tender. EXT: no edema SKIN: no acute rash

## 2019-09-01 ENCOUNTER — Telehealth: Payer: Self-pay | Admitting: *Deleted

## 2019-09-01 NOTE — Telephone Encounter (Signed)
Patient left a voicemail stating that he was recently in the hospital to have his appendix removed. Patient stated while he was in the hospital they gave him a vitamin that worked well for him. Patient stated that he was advised to take a multivitamin with minerals and wants to know what vitamin they gave him while he was in the hospital.

## 2019-09-02 DIAGNOSIS — R29898 Other symptoms and signs involving the musculoskeletal system: Secondary | ICD-10-CM | POA: Insufficient documentation

## 2019-09-02 MED ORDER — RIVAROXABAN 20 MG PO TABS
20.0000 mg | ORAL_TABLET | Freq: Every day | ORAL | 4 refills | Status: DC
Start: 2019-09-02 — End: 2019-12-10

## 2019-09-02 NOTE — Assessment & Plan Note (Signed)
With weight loss noted.  Refer to PT.

## 2019-09-02 NOTE — Assessment & Plan Note (Signed)
He has follow-up with surgery pending.  He is not having abdominal pain now and is able to tolerate p.o. diet.  I expect him to be able to gradually advance his diet.  He is deconditioned and we talked about getting PT set up.  No fevers.  He is passing gas and having bowel movements.  No blood in stool.  Still okay for outpatient follow-up.

## 2019-09-02 NOTE — Assessment & Plan Note (Addendum)
Pathophysiology discussed.  It likely makes sense for 6 months of treatment based on this episode.  I do not see an indication for lifelong anticoagulation.  I would not expect him to have an inherited issue without any other symptoms or family history at age 69.  He understood the rationale for treatment and routine anticoagulation cautions were given.  Discussed rechecking routine labs in about 1 week.

## 2019-09-03 NOTE — Telephone Encounter (Signed)
I can't see the brand vitamin that was used as inpatient.  If you can get that info from inpatient pharmacy, then let him know.  O/w he should be able to use any generic OTC MVI.  Thanks.

## 2019-09-04 ENCOUNTER — Ambulatory Visit: Payer: Medicare HMO | Admitting: Internal Medicine

## 2019-09-04 ENCOUNTER — Other Ambulatory Visit: Payer: Self-pay

## 2019-09-04 ENCOUNTER — Encounter: Payer: Self-pay | Admitting: Internal Medicine

## 2019-09-04 VITALS — BP 112/72 | HR 93 | Temp 97.7°F | Ht 71.0 in | Wt 172.2 lb

## 2019-09-04 DIAGNOSIS — I824Y9 Acute embolism and thrombosis of unspecified deep veins of unspecified proximal lower extremity: Secondary | ICD-10-CM | POA: Diagnosis not present

## 2019-09-04 DIAGNOSIS — G4733 Obstructive sleep apnea (adult) (pediatric): Secondary | ICD-10-CM

## 2019-09-04 NOTE — Progress Notes (Signed)
09/04/19- 68 yoM never smoker for sleep evaluation Medical problem list includes hx DVT, GERD,  NPSG  11/06/05- AHI 17/ hr, desaturation to 86%, body weight 174 lbs Assessed by Dr Gwenette Greet in 2012 with psychophysiologic insomnia CPAP use did help snoring and daytime fatigue, but was never used consistently.  Epworth score 14 Body weight today 172 lbs Tried CPAP but restless sleeper, never comfortable. Now bothered by frequent waking, daytime tiredness, waking with very dry mouth. Sleeps alone with no reported of snoring or apneas. Denies ENT surgery, heart or lung problem.  No sleep meds, Little caffeine.  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   Prior to Admission medications   Medication Sig Start Date End Date Taking? Authorizing Provider  acetaminophen (TYLENOL) 325 MG tablet Take 2 tablets (650 mg total) by mouth every 6 (six) hours as needed for mild pain or fever. 08/27/19  Yes Maczis, Barth Kirks, PA-C  Multiple Vitamin (MULTIVITAMIN WITH MINERALS) TABS tablet Take 1 tablet by mouth daily. 08/28/19  Yes Maczis, Barth Kirks, PA-C  ondansetron (ZOFRAN-ODT) 4 MG disintegrating tablet Take 1 tablet (4 mg total) by mouth every 6 (six) hours as needed for nausea. 08/27/19  Yes Maczis, Barth Kirks, PA-C  oxyCODONE (OXY IR/ROXICODONE) 5 MG immediate release tablet Take 1 tablet (5 mg total) by mouth every 6 (six) hours as needed for breakthrough pain. 08/27/19  Yes Maczis, Barth Kirks, PA-C  rivaroxaban (XARELTO) 20 MG TABS tablet Take 1 tablet (20 mg total) by mouth daily with supper. Use this prescription after completing starter pack 09/02/19  Yes Tonia Ghent, MD   Past Medical History:  Diagnosis Date  . Anemia of other chronic disease    normal blood blood counts since 2007  . Arthritis   . GERD (gastroesophageal reflux disease)   . Hemorrhoids   . Labyrinthitis   . Obstructive sleep apnea    study '07 - AHI 17, lowest O2 sat 86%. not using CPAP, couldn't tolerate CPAP  . Plantar fasciitis    . Right rotator cuff tear 2013   90% normal function.  . Rupture long head biceps tendon 2014   right. 90% normal function   Past Surgical History:  Procedure Laterality Date  . DENTAL SURGERY     implant  . INGUINAL HERNIA REPAIR Left 04/26/2018   Procedure: LEFT INGUINAL HERNIA REPAIR WITH MESH;  Surgeon: Jovita Kussmaul, MD;  Location: [place];  Service: General;  Laterality: Left;  . INSERTION OF MESH Left 04/26/2018   Procedure: INSERTION OF MESH;  Surgeon: Jovita Kussmaul, MD;  Location: [place];  Service: General;  Laterality: Left;  . LAPAROSCOPIC APPENDECTOMY N/A 08/12/2019   Procedure: APPENDECTOMY LAPAROSCOPIC;  Surgeon: Donnie Mesa, MD;  Location: [place];  Service: General;  Laterality: N/A;  . ORIF ULNAR FRACTURE Left    had screws and plate; had screws and plate removed [id number]-GRM   Family History  Problem Relation Age of Onset  . Colon cancer Maternal Aunt   . Esophageal cancer Maternal Uncle   . Leukemia Mother   . Diabetes Neg Hx   . Heart disease Neg Hx   . Hyperlipidemia Neg Hx   . Stroke Neg Hx   . Prostate cancer Neg Hx   . Rectal cancer Neg Hx   . Stomach cancer Neg Hx    Social History   Socioeconomic History  . Marital status: Divorced    Spouse name: Not on file  . Number of children: 3  .  Years of education: 49  . Highest education level: Not on file  Occupational History  . Occupation: METER TECH    Employer: [employer].  Tobacco Use  . Smoking status: Never Smoker  . Smokeless tobacco: Never Used  Substance and Sexual Activity  . Alcohol use: Yes    Alcohol/week: 0.0 standard drinks    Comment: social  . Drug use: No  . Sexual activity: Yes    Partners: Female  Other Topics Concern  . Not on file  Social History Narrative   [employer].    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    Lives alone.    Pt has children: 2 sons - '72, '80; 1 dtr -'76; 9 grandchildren, 1 great grandchild   Current  relationship status off/on. No complaint of sexual dysfunction. Regular Exercise- yes, tennis 3 x a week. Resistance training. Discussed ACP and referred to [email or web address] for his consideration (Dec '14)    Enjoys playing tennis.    Social Determinants of Health   Financial Resource Strain:   . Difficulty of Paying Living Expenses:   Food Insecurity:   . Worried About Charity fundraiser in the Last Year:   . Arboriculturist in the Last Year:   Transportation Needs:   . Film/video editor (Medical):   Marland Kitchen Lack of Transportation (Non-Medical):   Physical Activity:   . Days of Exercise per Week:   . Minutes of Exercise per Session:   Stress:   . Feeling of Stress :   Social Connections:   . Frequency of Communication with Friends and Family:   . Frequency of Social Gatherings with Friends and Family:   . Attends Religious Services:   . Active Member of Clubs or Organizations:   . Attends Archivist Meetings:   Marland Kitchen Marital Status:   Intimate Partner Violence:   . Fear of Current or Ex-Partner:   . Emotionally Abused:   Marland Kitchen Physically Abused:   . Sexually Abused:    ROS-see HPI   + = positive Constitutional:    weight loss, night sweats, fevers, chills, fatigue, lassitude. HEENT:    headaches, difficulty swallowing, tooth/dental problems, sore throat,       sneezing, itching, ear ache, nasal congestion, post nasal drip, snoring CV:    chest pain, orthopnea, PND, swelling in lower extremities, anasarca,                                  dizziness, palpitations Resp:   shortness of breath with exertion or at rest.                productive cough,   non-productive cough, coughing up of blood.              change in color of mucus.  wheezing.   Skin:    rash or lesions. GI:  No-   heartburn, indigestion, abdominal pain, nausea, vomiting, diarrhea,                 change in bowel habits, loss of appetite GU: dysuria, change in color of urine, no urgency or  frequency.   flank pain. MS:   joint pain, stiffness, decreased range of motion, back pain. Neuro-     nothing unusual Psych:  change in mood or affect.  depression or anxiety.   memory loss.  OBJ- Physical  Exam General- Alert, Oriented, Affect-appropriate, Distress- none acute, tall, lean Skin- rash-none, lesions- none, excoriation- none Lymphadenopathy- none Head- atraumatic            Eyes- Gross vision intact, PERRLA, conjunctivae and secretions clear            Ears- Hearing, canals-normal            Nose- Clear, no-Septal dev, mucus, polyps, erosion, perforation             Throat- Mallampati III , mucosa clear , drainage- none, tonsils- atrophic, + own teeth Neck- flexible , trachea midline, no stridor , thyroid nl, carotid no bruit Chest - symmetrical excursion , unlabored           Heart/CV- RRR , no murmur , no gallop  , no rub, nl s1 s2                           - JVD- none , edema- none, stasis changes- none, varices- none           Lung- clear to P&A, wheeze- none, cough- none , dullness-none, rub- none           Chest wall-  Abd-  Br/ Gen/ Rectal- Not done, not indicated Extrem- cyanosis- none, clubbing, none, atrophy- none, strength- nl Neuro- grossly intact to observation

## 2019-09-04 NOTE — Patient Instructions (Signed)
Order- schedule HST   Dx OSA  Please cal [place] 2 weeks after your sleep test is done, to see if results and recommendations are ready yet. If appropriate, we may be able to start treatment before we see you next.

## 2019-09-04 NOTE — Telephone Encounter (Signed)
Patient advised but patient was adiment that he know what brand.  I phoned the inpatient pharmacy and they stated that it was just a MTV for men, they use no certain brand.  Patient advised.

## 2019-09-04 NOTE — Assessment & Plan Note (Signed)
He thinks both legs were involved,  But unsure. Managed by PCP.

## 2019-09-04 NOTE — Assessment & Plan Note (Signed)
Probable OSA. Before we manage for insomnia we need to reassess. Home sleep test discussed. Might do better than before with modern CPAP and mask, but might also consider oral appliance.

## 2019-09-05 ENCOUNTER — Telehealth: Payer: Self-pay

## 2019-09-05 ENCOUNTER — Other Ambulatory Visit: Payer: Self-pay

## 2019-09-05 ENCOUNTER — Other Ambulatory Visit (INDEPENDENT_AMBULATORY_CARE_PROVIDER_SITE_OTHER): Payer: Medicare HMO

## 2019-09-05 DIAGNOSIS — K3533 Acute appendicitis with perforation and localized peritonitis, with abscess: Secondary | ICD-10-CM | POA: Diagnosis not present

## 2019-09-05 LAB — CBC WITH DIFFERENTIAL/PLATELET
Basophils Absolute: 0.1 10*3/uL (ref 0.0–0.1)
Basophils Relative: 1.3 % (ref 0.0–3.0)
Eosinophils Absolute: 0.1 10*3/uL (ref 0.0–0.7)
Eosinophils Relative: 1.5 % (ref 0.0–5.0)
HCT: 34.9 % — ABNORMAL LOW (ref 39.0–52.0)
Hemoglobin: 11.3 g/dL — ABNORMAL LOW (ref 13.0–17.0)
Lymphocytes Relative: 24.1 % (ref 12.0–46.0)
Lymphs Abs: 1.3 10*3/uL (ref 0.7–4.0)
MCHC: 32.5 g/dL (ref 30.0–36.0)
MCV: 85.9 fl (ref 78.0–100.0)
Monocytes Absolute: 0.7 10*3/uL (ref 0.1–1.0)
Monocytes Relative: 12.9 % — ABNORMAL HIGH (ref 3.0–12.0)
Neutro Abs: 3.3 10*3/uL (ref 1.4–7.7)
Neutrophils Relative %: 60.2 % (ref 43.0–77.0)
Platelets: 337 10*3/uL (ref 150.0–400.0)
RBC: 4.06 Mil/uL — ABNORMAL LOW (ref 4.22–5.81)
RDW: 14.3 % (ref 11.5–15.5)
WBC: 5.5 10*3/uL (ref 4.0–10.5)

## 2019-09-05 NOTE — Telephone Encounter (Signed)
Pt is calling to get advice on what he should do... pt reports he has had 2 BM in the past 4 days that it seemed a little harder to come out, but the stool is soft... He does report he has Hemorrhoids that has been bleeding intermittently.... also some abd cramping that seems to resolve following the BM... Pt does drink 6-8 cups of water daily as well... please advise

## 2019-09-05 NOTE — Telephone Encounter (Signed)
This can occ happen but should get better.  I would add on either extra fiber/metamucil or start taking miralax.  I would try prep H for the hemorrhoids.  Please update [place] as needed.  Thanks.

## 2019-09-05 NOTE — Telephone Encounter (Signed)
Patient advised.

## 2019-09-09 DIAGNOSIS — H25813 Combined forms of age-related cataract, bilateral: Secondary | ICD-10-CM | POA: Diagnosis not present

## 2019-09-09 DIAGNOSIS — H353131 Nonexudative age-related macular degeneration, bilateral, early dry stage: Secondary | ICD-10-CM | POA: Diagnosis not present

## 2019-09-15 ENCOUNTER — Other Ambulatory Visit: Payer: Self-pay | Admitting: Family Medicine

## 2019-09-15 DIAGNOSIS — Z8719 Personal history of other diseases of the digestive system: Secondary | ICD-10-CM

## 2019-09-16 ENCOUNTER — Telehealth: Payer: Self-pay | Admitting: Radiology

## 2019-09-16 NOTE — Telephone Encounter (Signed)
Patient needs to schedule a non fasting lab appt for the end of June. LVM

## 2019-09-17 ENCOUNTER — Encounter: Payer: Self-pay | Admitting: Family Medicine

## 2019-09-18 DIAGNOSIS — K3532 Acute appendicitis with perforation and localized peritonitis, without abscess: Secondary | ICD-10-CM | POA: Diagnosis not present

## 2019-09-26 ENCOUNTER — Telehealth: Payer: Self-pay

## 2019-09-26 ENCOUNTER — Ambulatory Visit (INDEPENDENT_AMBULATORY_CARE_PROVIDER_SITE_OTHER): Payer: Medicare HMO | Admitting: Family Medicine

## 2019-09-26 ENCOUNTER — Other Ambulatory Visit: Payer: Self-pay

## 2019-09-26 ENCOUNTER — Encounter: Payer: Self-pay | Admitting: Family Medicine

## 2019-09-26 VITALS — BP 104/62 | HR 80 | Temp 97.4°F | Ht 71.0 in | Wt 174.0 lb

## 2019-09-26 DIAGNOSIS — K625 Hemorrhage of anus and rectum: Secondary | ICD-10-CM | POA: Diagnosis not present

## 2019-09-26 DIAGNOSIS — Z8719 Personal history of other diseases of the digestive system: Secondary | ICD-10-CM

## 2019-09-26 LAB — CBC WITH DIFFERENTIAL/PLATELET
Basophils Absolute: 0 10*3/uL (ref 0.0–0.1)
Basophils Relative: 0.7 % (ref 0.0–3.0)
Eosinophils Absolute: 0.1 10*3/uL (ref 0.0–0.7)
Eosinophils Relative: 3.3 % (ref 0.0–5.0)
HCT: 37.9 % — ABNORMAL LOW (ref 39.0–52.0)
Hemoglobin: 12.3 g/dL — ABNORMAL LOW (ref 13.0–17.0)
Lymphocytes Relative: 39.6 % (ref 12.0–46.0)
Lymphs Abs: 1.4 10*3/uL (ref 0.7–4.0)
MCHC: 32.4 g/dL (ref 30.0–36.0)
MCV: 86.1 fl (ref 78.0–100.0)
Monocytes Absolute: 0.5 10*3/uL (ref 0.1–1.0)
Monocytes Relative: 15.1 % — ABNORMAL HIGH (ref 3.0–12.0)
Neutro Abs: 1.5 10*3/uL (ref 1.4–7.7)
Neutrophils Relative %: 41.3 % — ABNORMAL LOW (ref 43.0–77.0)
Platelets: 284 10*3/uL (ref 150.0–400.0)
RBC: 4.4 Mil/uL (ref 4.22–5.81)
RDW: 14.8 % (ref 11.5–15.5)
WBC: 3.5 10*3/uL — ABNORMAL LOW (ref 4.0–10.5)

## 2019-09-26 NOTE — Telephone Encounter (Signed)
Pt said for 3 wks pt has been having constipated stools and having bleeding with blood in the water of commode from external hemorrhoid. Pt is on xarelto; pt is not having H/A, dizziness, weakness and no abd pain. Pt has no covid symptoms, no travel and no known exposure to + covid. Pt scheduled in office visit today at 10:30. UC & ED precautions given and pt voiced understanding.FYI to Dr Damita Dunnings.

## 2019-09-26 NOTE — Telephone Encounter (Signed)
See OV note.  Thanks.  

## 2019-09-26 NOTE — Progress Notes (Signed)
This visit occurred during the SARS-CoV-2 public health emergency.  Safety protocols were in place, including screening questions prior to the visit, additional usage of staff PPE, and extensive cleaning of exam room while observing appropriate contact time as indicated for disinfecting solutions.  On xarelto at baseline.  He stopped MVI due to it possibly causing constipation.  D/w pt.    No bleeding except from possible hemorrhoid.  Not painful but annoying and "letting me know that it is present."  No leg swelling.  No blood in urine.  No FCNAVD.  He has some occ mild lower stomach "irritation" but not severe pain.  BRBPR this AM.  He isn't lightheaded.    Meds, vitals, and allergies reviewed.   ROS: Per HPI unless specifically indicated in ROS section   nad ncat rrr ctab abd soft, normal BS Rectal exam without gross blood.  No external hemorrhoid or fissure noted but he does have a small amount of irritation at 12:00 just above the rectum.  This appears to be separate from the rectum itself.  No visible internal hemorrhoids noted.

## 2019-09-26 NOTE — Patient Instructions (Signed)
I would keep using prep H and take miralax daily as needed.   I would hold the multivitamin for now.   Go to the lab on the way out.   If you have mychart we'll likely use that to update you.    Try to avoid straining.   If you keep having trouble then let me know.  Take care.  Glad to see you.

## 2019-09-28 DIAGNOSIS — K625 Hemorrhage of anus and rectum: Secondary | ICD-10-CM | POA: Insufficient documentation

## 2019-09-28 NOTE — Assessment & Plan Note (Addendum)
Could be from external irritation or from internal hemorrhoids.  Discussed options.  Reasonable to recheck CBC today, use Preparation H, avoid straining, and stop multivitamin if it possibly contributed to constipation.  He will update me as needed.  He agrees to plan.  Okay for outpatient follow-up.  He can use MiraLAX as needed.

## 2019-10-09 ENCOUNTER — Ambulatory Visit: Payer: Medicare HMO

## 2019-10-09 ENCOUNTER — Other Ambulatory Visit: Payer: Self-pay

## 2019-10-09 DIAGNOSIS — G4733 Obstructive sleep apnea (adult) (pediatric): Secondary | ICD-10-CM | POA: Diagnosis not present

## 2019-10-13 ENCOUNTER — Other Ambulatory Visit (INDEPENDENT_AMBULATORY_CARE_PROVIDER_SITE_OTHER): Payer: Medicare HMO

## 2019-10-13 ENCOUNTER — Other Ambulatory Visit: Payer: Self-pay | Admitting: Family Medicine

## 2019-10-13 DIAGNOSIS — Z8719 Personal history of other diseases of the digestive system: Secondary | ICD-10-CM

## 2019-10-13 LAB — CBC WITH DIFFERENTIAL/PLATELET
Basophils Absolute: 0 10*3/uL (ref 0.0–0.1)
Basophils Relative: 1.1 % (ref 0.0–3.0)
Eosinophils Absolute: 0.1 10*3/uL (ref 0.0–0.7)
Eosinophils Relative: 4.8 % (ref 0.0–5.0)
HCT: 35.4 % — ABNORMAL LOW (ref 39.0–52.0)
Hemoglobin: 11.5 g/dL — ABNORMAL LOW (ref 13.0–17.0)
Lymphocytes Relative: 43.8 % (ref 12.0–46.0)
Lymphs Abs: 1.3 10*3/uL (ref 0.7–4.0)
MCHC: 32.6 g/dL (ref 30.0–36.0)
MCV: 86.4 fl (ref 78.0–100.0)
Monocytes Absolute: 0.4 10*3/uL (ref 0.1–1.0)
Monocytes Relative: 15.3 % — ABNORMAL HIGH (ref 3.0–12.0)
Neutro Abs: 1 10*3/uL — ABNORMAL LOW (ref 1.4–7.7)
Neutrophils Relative %: 35 % — ABNORMAL LOW (ref 43.0–77.0)
Platelets: 246 10*3/uL (ref 150.0–400.0)
RBC: 4.1 Mil/uL — ABNORMAL LOW (ref 4.22–5.81)
RDW: 15.2 % (ref 11.5–15.5)
WBC: 2.9 10*3/uL — ABNORMAL LOW (ref 4.0–10.5)

## 2019-10-21 DIAGNOSIS — G4733 Obstructive sleep apnea (adult) (pediatric): Secondary | ICD-10-CM | POA: Diagnosis not present

## 2019-10-21 NOTE — Progress Notes (Signed)
Patient states that he has not seen any black stools but has had some bright red bleeding intermittently when cleaning himself after a BM.  Patient says he feels tired and sluggish and is not motivated to do much of anything.

## 2019-10-23 ENCOUNTER — Other Ambulatory Visit: Payer: Self-pay | Admitting: Family Medicine

## 2019-10-23 DIAGNOSIS — D649 Anemia, unspecified: Secondary | ICD-10-CM

## 2019-10-28 ENCOUNTER — Other Ambulatory Visit: Payer: Self-pay

## 2019-10-29 ENCOUNTER — Other Ambulatory Visit (INDEPENDENT_AMBULATORY_CARE_PROVIDER_SITE_OTHER): Payer: Medicare HMO

## 2019-10-29 ENCOUNTER — Other Ambulatory Visit: Payer: Self-pay | Admitting: Family Medicine

## 2019-10-29 DIAGNOSIS — D649 Anemia, unspecified: Secondary | ICD-10-CM

## 2019-10-29 LAB — FERRITIN: Ferritin: 82.5 ng/mL (ref 22.0–322.0)

## 2019-10-29 LAB — CBC WITH DIFFERENTIAL/PLATELET
Basophils Absolute: 0 10*3/uL (ref 0.0–0.1)
Basophils Relative: 0.9 % (ref 0.0–3.0)
Eosinophils Absolute: 0.2 10*3/uL (ref 0.0–0.7)
Eosinophils Relative: 5.6 % — ABNORMAL HIGH (ref 0.0–5.0)
HCT: 38 % — ABNORMAL LOW (ref 39.0–52.0)
Hemoglobin: 12.5 g/dL — ABNORMAL LOW (ref 13.0–17.0)
Lymphocytes Relative: 45.4 % (ref 12.0–46.0)
Lymphs Abs: 1.5 10*3/uL (ref 0.7–4.0)
MCHC: 33 g/dL (ref 30.0–36.0)
MCV: 86.4 fl (ref 78.0–100.0)
Monocytes Absolute: 0.5 10*3/uL (ref 0.1–1.0)
Monocytes Relative: 14.5 % — ABNORMAL HIGH (ref 3.0–12.0)
Neutro Abs: 1.1 10*3/uL — ABNORMAL LOW (ref 1.4–7.7)
Neutrophils Relative %: 33.6 % — ABNORMAL LOW (ref 43.0–77.0)
Platelets: 260 10*3/uL (ref 150.0–400.0)
RBC: 4.4 Mil/uL (ref 4.22–5.81)
RDW: 15.3 % (ref 11.5–15.5)
WBC: 3.3 10*3/uL — ABNORMAL LOW (ref 4.0–10.5)

## 2019-11-26 ENCOUNTER — Ambulatory Visit: Payer: Medicare HMO | Admitting: Nurse Practitioner

## 2019-11-26 ENCOUNTER — Other Ambulatory Visit (INDEPENDENT_AMBULATORY_CARE_PROVIDER_SITE_OTHER): Payer: Medicare HMO

## 2019-11-26 ENCOUNTER — Encounter: Payer: Self-pay | Admitting: Nurse Practitioner

## 2019-11-26 ENCOUNTER — Telehealth: Payer: Self-pay

## 2019-11-26 VITALS — BP 110/78 | HR 70 | Ht 71.0 in | Wt 185.4 lb

## 2019-11-26 DIAGNOSIS — K625 Hemorrhage of anus and rectum: Secondary | ICD-10-CM | POA: Diagnosis not present

## 2019-11-26 DIAGNOSIS — Z86010 Personal history of colonic polyps: Secondary | ICD-10-CM | POA: Diagnosis not present

## 2019-11-26 DIAGNOSIS — D649 Anemia, unspecified: Secondary | ICD-10-CM

## 2019-11-26 DIAGNOSIS — K648 Other hemorrhoids: Secondary | ICD-10-CM

## 2019-11-26 LAB — CBC
HCT: 38.1 % — ABNORMAL LOW (ref 39.0–52.0)
Hemoglobin: 12.4 g/dL — ABNORMAL LOW (ref 13.0–17.0)
MCHC: 32.5 g/dL (ref 30.0–36.0)
MCV: 86.2 fl (ref 78.0–100.0)
Platelets: 264 10*3/uL (ref 150.0–400.0)
RBC: 4.42 Mil/uL (ref 4.22–5.81)
RDW: 14.7 % (ref 11.5–15.5)
WBC: 3.9 10*3/uL — ABNORMAL LOW (ref 4.0–10.5)

## 2019-11-26 LAB — B12 AND FOLATE PANEL
Folate: 12.7 ng/mL (ref >5.9)
Vitamin B-12: 449 pg/mL (ref 211–911)

## 2019-11-26 LAB — IRON: Iron: 98 ug/dL (ref 42–165)

## 2019-11-26 LAB — FERRITIN: Ferritin: 59 ng/mL (ref 22.0–322.0)

## 2019-11-26 NOTE — Telephone Encounter (Signed)
°  Request for clearance: hemorrhoid banding  When is this procedure scheduled?     01/20/2020  What type of clearance is required ?   Pharmacy  Are there any medications that need to be held prior to surgery and how long? Xarelto x 2 days  Practice name and name of physician performing surgery?      [facility]  What is your office phone and fax number?      Phone- [phone]  Fax- [phone]

## 2019-11-26 NOTE — Progress Notes (Signed)
11/26/2019 Joel Lopez [id number] December 21, 1950   Chief Complaint: Rectal bleeding   History of Present Illness: Joel Lopez is a 69 year old male with a past medical history of arthritis, OSA not using cpap, anemia, DVT on Xarelto GERD and colon polyps. He presents to our office today as recommended by Dr. Damita Dunnings for further evaluation for rectal bleeding. He complains of having bright red rectal bleeding which he associates to having hemorrhoids.  He reorts having intermittent chronic hemorrhoidal bleeding for many years.  However, his rectal bleeding has worsened since his appendectomy surgery due to having a perforated appendix with a peritoneal abscess 08/12/2019.  His postoperative course was complicated by the development of an ileus which required a NGT and bowel rest and bilateral DVTs placed on Lovenox and transitioned to Xarelto. He received IV antibiotics. He required TPN for about one week. He was discharged home on 08/27/2019.  He developed some constipation with straining following to appendectomy and prolonged hospital admission which continued when he returned home.  He denied taking any narcotic pain medications once discharged home.  He took MiraLAX with improvement.  He was seen by his surgeon  Dr. Odis Luster  who recommended he try Colace which he continues to take.  He reports passing a BM  most days.  The first part of his stool is often hard and dry then he passes a few balls of stool followed by a softer formed stool.  He is eating a fiber cereal daily.  He reported having daily rectal bleeding for less than 1 week following his appendectomy. He is not having rectal bleeding for a few days then none for a week or so. He describes seeing a small amount of bright red blood on the toilet tissue and sometimes on the stool. He denies seeing large amounts of blood in the toilet water. No associated abdominal pain. He has increased anal discomfort and itchiness when his internal  hemorrhoids are prolapsed. He used Anusol suppositories and Preparation H with improvement.  His most recent colonoscopy was on 01/24/2018, 2 polyps were removed from the colon and small internal hemorrhoids were noted.  He is due for repeat colonoscopy October 2024.  He denies having any dysphagia or heartburn.  No upper abdominal pain.  No melena but he questioned seeing a small black area of a solid stool on one occasion in June.  No NSAID use.  No fever, sweats or chills.  No weight loss.  His maternal uncle had esophageal cancer history of tobacco and alcohol abuse.  Laboratory studies 10/29/2019 showed a hemoglobin 12.5 and hematocrit 38.    CBC Latest Ref Rng & Units 10/29/2019 10/13/2019 09/26/2019  WBC 4.0 - 10.5 K/uL 3.3(L) 2.9(L) 3.5(L)  Hemoglobin 13.0 - 17.0 g/dL 12.5(L) 11.5(L) 12.3(L)  Hematocrit 39 - 52 % 38.0(L) 35.4(L) 37.9(L)  Platelets 150 - 400 K/uL 260.0 246.0 284.0   CMP Latest Ref Rng & Units 08/27/2019 08/26/2019 08/25/2019  Glucose 70 - 99 mg/dL 86 97 87  BUN 8 - 23 mg/dL 15 11 15   Creatinine 0.61 - 1.24 mg/dL 0.81 0.88 0.83  Sodium 135 - 145 mmol/L 137 137 139  Potassium 3.5 - 5.1 mmol/L 4.0 4.2 4.1  Chloride 98 - 111 mmol/L 106 104 112(H)  CO2 22 - 32 mmol/L 22 23 20(L)  Calcium 8.9 - 10.3 mg/dL 8.3(L) 8.8(L) 8.5(L)  Total Protein 6.5 - 8.1 g/dL - - -  Total Bilirubin 0.3 - 1.2 mg/dL - - -  Alkaline Phos 38 - 126 U/L - - -  AST 15 - 41 U/L - - -  ALT 0 - 44 U/L - - -    01/24/2018 by Dr. Hilarie Fredrickson: - One 3 mm polyp in the ascending colon, removed with a cold snare. Resected and retrieved. - One 6 mm polyp at the recto-sigmoid colon, removed with a cold snare. Resected and retrieved. - Small internal hemorrhoids. - SESSILE SERRATED POLYP WITHOUT DYSPLASIA (ONE). - HAMARTOMATOUS POLYP (ONE). - NO EVIDENCE OF MALIGNANCY. - Recall colonoscopy 5 years.   Current Outpatient Medications on File Prior to Visit  Medication Sig Dispense Refill  . docusate sodium (COLACE)  100 MG capsule Take 100 mg by mouth 2 (two) times daily.    . hydrocortisone cream (PREPARATION H) 1 % Apply 1 application topically 2 (two) times daily.    . Multiple Vitamin (MULTIVITAMIN) tablet Take 1 tablet by mouth daily.    . rivaroxaban (XARELTO) 20 MG TABS tablet Take 1 tablet (20 mg total) by mouth daily with supper. Use this prescription after completing starter pack 30 tablet 4   No current facility-administered medications on file prior to visit.    No Known Allergies   Current Medications, Allergies, Past Medical History, Past Surgical History, Family History and Social History were reviewed in [employer] record.   Physical Exam: BP 110/78   Pulse 70   Ht 5\' 11"  (1.803 m)   Wt 185 lb 6 oz (84.1 kg)   BMI 25.85 kg/m   General: Well developed 69 year old male in no acute distress. Head: Normocephalic and atraumatic. Eyes: No scleral icterus. Conjunctiva pink . Ears: Normal auditory acuity. Mouth: Dentition intact. No ulcers or lesions.  Lungs: Clear throughout to auscultation. Heart: Regular rate and rhythm, no murmur. Abdomen: Soft, nontender and nondistended. No masses or hepatomegaly. Normal bowel sounds x 4 quadrants.  Rectal: Small papule with a scant amount of blood  less 1 cm from the posterior anal orifice (does not appear to be a fistula), no anal fissure, non bleeding inflamed internal hemorrhoids confirmed by anoscopy. Internal hemorrhoids without prolapse at this time. Maya CNA present during exam.  Musculoskeletal: Symmetrical with no gross deformities. Extremities: No edema. Neurological: Alert oriented x 4. No focal deficits.  Psychological: Alert and cooperative. Normal mood and affect  Assessment and Recommendations:  65. 69 year old male with rectal bleeding most likely due to internal hemorrhoids on Xarelto. -Schedule appointment for hemorrhoid banding with Dr. Hilarie Fredrickson. I will consult with Dr. Hilarie Fredrickson to verify if ok to  proceed with hemorrhoid banding or if he prefers to defer for a few months as due to the patient's hx of bilateral  DVTs following his appendectomy surgery 08/12/2019 on Xarelto.  -Our office will contact Dr. Damita Dunnings to verify Xarelto instructions prior to hemorrhoid banding -MiraLAX nightly as needed -Desitin insert a small amount inside the anal area into the external area 3 times daily as needed for hemorrhoidal irritation and bleeding -Patient to call our office if rectal bleeding worsens.   2. Normocytic anemia.  Chronic hemorrhoidal bleeding most likely contributing to his anemia. -Iron, iron saturation, TIBC, Ferritin  -Discussed EGD +/-  flexible sigmoidoscopy if the above lab results identify iron deficiency  3.  S/P appendectomy secondary to perforated appendix with abscess 08/12/2019 postoperative bilateral DVTs on Xarelto  4.  History of colon polyps.  -Next colonoscopy due 01/2023

## 2019-11-26 NOTE — Patient Instructions (Addendum)
If you are age 67 or older, your body mass index should be between 23-30. Your Body mass index is 25.85 kg/m. If this is out of the aforementioned range listed, please consider follow up with your Primary Care Provider.  If you are age 38 or younger, your body mass index should be between 19-25. Your Body mass index is 25.85 kg/m. If this is out of the aformentioned range listed, please consider follow up with your Primary Care Provider.   Your provider has requested that you go to the basement level for lab work before leaving today. Press "B" on the elevator. The lab is located at the first door on the left as you exit the elevator.  Due to recent changes in healthcare laws, you may see the results of your imaging and laboratory studies on MyChart before your provider has had a chance to review them.  We understand that in some cases there may be results that are confusing or concerning to you. Not all laboratory results come back in the same time frame and the provider may be waiting for multiple results in order to interpret others.  Please give [place] 48 hours in order for your provider to thoroughly review all the results before contacting the office for clarification of your results.   You are scheduled to see Dr Hilarie Fredrickson on 01/20/20 at 3:40pm for hemorrhoid banding.  Please purchase the following medications over the counter and take as directed:  1. START: Miralax one capful at bedtime each night. 2. START: Desitin for hemorrhoids three times daily as needed.  Further evaluation will be determined after lab results have been received.   Please call the office if symptoms worsen or fail to improve.   Thank you for entrusting me with your care and choosing [facility].  Carl Best, NP

## 2019-11-27 ENCOUNTER — Telehealth: Payer: Self-pay

## 2019-11-27 DIAGNOSIS — D649 Anemia, unspecified: Secondary | ICD-10-CM

## 2019-11-27 LAB — IRON, TOTAL/TOTAL IRON BINDING CAP
%SAT: 31 % (calc) (ref 20–48)
Iron: 93 ug/dL (ref 50–180)
TIBC: 298 mcg/dL (calc) (ref 250–425)

## 2019-11-27 NOTE — Telephone Encounter (Signed)
Pt has CBC with diff scheduled at [facility] lab on 12/01/19 at 12:15 pm. Pt said GI did labs on 11/26/19 and did CBC only with no diff. Pt wants to know if still needs to have CBC with diff done on 12/01/19. Pt said he will come in to have lab done on 12/01/19 if Dr Damita Dunnings prefers the CBC with diff. Pt request cb after reviewed by Dr Damita Dunnings. 11/26/19 CBC and other labs done at GI office is under pt's lab tab in [facility].

## 2019-11-28 NOTE — Telephone Encounter (Signed)
1) Patient says he is ok with canceling the lab appt here but wonders if he needs to reschedule it for next month or whenever. 2) Patient also says GI is planning to band his hemorrhoid and he wants your opinion on that as well.

## 2019-11-28 NOTE — Telephone Encounter (Signed)
I would cancel the lab appointment here.  Thanks.

## 2019-12-01 ENCOUNTER — Other Ambulatory Visit: Payer: Self-pay

## 2019-12-01 ENCOUNTER — Other Ambulatory Visit (INDEPENDENT_AMBULATORY_CARE_PROVIDER_SITE_OTHER): Payer: Medicare HMO

## 2019-12-01 DIAGNOSIS — D649 Anemia, unspecified: Secondary | ICD-10-CM

## 2019-12-01 LAB — CBC WITH DIFFERENTIAL/PLATELET
Basophils Absolute: 0 10*3/uL (ref 0.0–0.1)
Basophils Relative: 1 % (ref 0.0–3.0)
Eosinophils Absolute: 0.2 10*3/uL (ref 0.0–0.7)
Eosinophils Relative: 6.6 % — ABNORMAL HIGH (ref 0.0–5.0)
HCT: 38.9 % — ABNORMAL LOW (ref 39.0–52.0)
Hemoglobin: 12.8 g/dL — ABNORMAL LOW (ref 13.0–17.0)
Lymphocytes Relative: 46.9 % — ABNORMAL HIGH (ref 12.0–46.0)
Lymphs Abs: 1.4 10*3/uL (ref 0.7–4.0)
MCHC: 33 g/dL (ref 30.0–36.0)
MCV: 85.5 fl (ref 78.0–100.0)
Monocytes Absolute: 0.3 10*3/uL (ref 0.1–1.0)
Monocytes Relative: 11.4 % (ref 3.0–12.0)
Neutro Abs: 1 10*3/uL — ABNORMAL LOW (ref 1.4–7.7)
Neutrophils Relative %: 34.1 % — ABNORMAL LOW (ref 43.0–77.0)
Platelets: 253 10*3/uL (ref 150.0–400.0)
RBC: 4.55 Mil/uL (ref 4.22–5.81)
RDW: 14.8 % (ref 11.5–15.5)
WBC: 3 10*3/uL — ABNORMAL LOW (ref 4.0–10.5)

## 2019-12-01 NOTE — Telephone Encounter (Signed)
It is reasonable to hold Xarelto for 2 days prior to the procedure and then restart the day afterward.

## 2019-12-01 NOTE — Telephone Encounter (Signed)
We can recheck his labs in a month.  I put in the order.  Please schedule.  It is reasonable to get the hemorrhoid banding done.  He would need to hold his Xarelto for 2 days prior.  That is reasonable and should be okay to do.  I sent a note back to the GI clinic about that.  Thanks.

## 2019-12-01 NOTE — Telephone Encounter (Signed)
Patient advised to hold Xarelto 2 days prior to hemorrhoid banding starting on 01-18-2020.  Last dose of Xarelto will be taken on the evenign of 01-17-2020.  Patient agreed to plan and verbalized understanding.  No further questions.

## 2019-12-01 NOTE — Telephone Encounter (Signed)
Patient came in for labs anyway even though I had told him it was ok to cancel.  Patient advised about hemorrhoid banding and holding Xarelto 2 days prior.

## 2019-12-07 ENCOUNTER — Other Ambulatory Visit: Payer: Self-pay | Admitting: Family Medicine

## 2019-12-08 ENCOUNTER — Other Ambulatory Visit: Payer: Self-pay

## 2019-12-08 ENCOUNTER — Encounter: Payer: Self-pay | Admitting: *Deleted

## 2019-12-08 ENCOUNTER — Ambulatory Visit: Payer: Medicare HMO | Admitting: Internal Medicine

## 2019-12-08 VITALS — BP 110/70 | HR 69 | Temp 97.3°F | Ht 71.0 in | Wt 187.4 lb

## 2019-12-08 DIAGNOSIS — K219 Gastro-esophageal reflux disease without esophagitis: Secondary | ICD-10-CM | POA: Diagnosis not present

## 2019-12-08 DIAGNOSIS — G4733 Obstructive sleep apnea (adult) (pediatric): Secondary | ICD-10-CM | POA: Diagnosis not present

## 2019-12-08 NOTE — Patient Instructions (Signed)
Order- referral to Dr Ron Parker, orthodontist    Consider oral appliance to treat OSA  Please  Call if we can help

## 2019-12-08 NOTE — Progress Notes (Signed)
Addendum: Reviewed and agree with assessment and management plan. Rodel Glaspy M, MD  

## 2019-12-08 NOTE — Telephone Encounter (Signed)
Electronic refill request. Xarelto Last office visit:   09/26/2019 Last Filled:     30 tablet 4 09/02/2019

## 2019-12-08 NOTE — Progress Notes (Signed)
HPI M never smoker followed for OSA, complicated by hx DVT, GERD NPSG  11/06/05- AHI 17/ hr, desaturation to 86%, body weight 174 lbs HST 10/09/19 AHI 8.6/ hr, desaturation to 86%, Body weight 172 lbs  ---------------------------------------------------------------------------------------------  09/04/19- 68 yoM never smoker for sleep evaluation Medical problem list includes hx DVT, GERD,  NPSG  11/06/05- AHI 17/ hr, desaturation to 86%, body weight 174 lbs Assessed by Dr Gwenette Greet in 2012 with psychophysiologic insomnia CPAP use did help snoring and daytime fatigue, but was never used consistently.  Epworth score 14 Body weight today 172 lbs Tried CPAP but restless sleeper, never comfortable. Now bothered by frequent waking, daytime tiredness, waking with very dry mouth. Sleeps alone with no reported of snoring or apneas. Denies ENT surgery, heart or lung problem.  No sleep meds, Little caffeine.  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   12/08/19- 69 yoM never smoker followed for OSA, complicated by hx DVT, GERD HST 10/09/19 AHI 8.6/ hr, desaturation to 86%, Body weight 172 lbs Body weight today 187 lbs Had 2 Phizer Covax Had appendectomy 4/27. Sleeping fairly well now, but still some daytime drowsiness. Discussed options. He never got comfortable with CPAP when tried at initial sleep study in 2007. Considered trying again with newer technology, but interested in learning about oral appliance option, which could do very well with his mild OSA.  ROS-see HPI   + = positive Constitutional:    weight loss, night sweats, fevers, chills, fatigue, lassitude. HEENT:    headaches, difficulty swallowing, tooth/dental problems, sore throat,       sneezing, itching, ear ache, nasal congestion, post nasal drip, snoring CV:    chest pain, orthopnea, PND, swelling in lower extremities, anasarca,                                  dizziness, palpitations Resp:   shortness of breath with exertion or at  rest.                productive cough,   non-productive cough, coughing up of blood.              change in color of mucus.  wheezing.   Skin:    rash or lesions. GI:  No-   heartburn, indigestion, abdominal pain, nausea, vomiting, diarrhea,                 change in bowel habits, loss of appetite GU: dysuria, change in color of urine, no urgency or frequency.   flank pain. MS:   joint pain, stiffness, decreased range of motion, back pain. Neuro-     nothing unusual Psych:  change in mood or affect.  depression or anxiety.   memory loss.  OBJ- Physical Exam General- Alert, Oriented, Affect-appropriate, Distress- none acute, tall, lean Skin- rash-none, lesions- none, excoriation- none Lymphadenopathy- none Head- atraumatic            Eyes- Gross vision intact, PERRLA, conjunctivae and secretions clear            Ears- Hearing, canals-normal            Nose- Clear, no-Septal dev, mucus, polyps, erosion, perforation             Throat- Mallampati III , mucosa clear , drainage- none, tonsils- atrophic, + own teeth Neck- flexible , trachea midline, no stridor , thyroid nl, carotid no bruit Chest - symmetrical  excursion , unlabored           Heart/CV- RRR , no murmur , no gallop  , no rub, nl s1 s2                           - JVD- none , edema- none, stasis changes- none, varices- none           Lung- clear to P&A, wheeze- none, cough- none , dullness-none, rub- none           Chest wall-  Abd-  Br/ Gen/ Rectal- Not done, not indicated Extrem- cyanosis- none, clubbing, none, atrophy- none, strength- nl Neuro- grossly intact to observation

## 2019-12-09 NOTE — Telephone Encounter (Signed)
Patient says he has only taken it for 4 months.  He has about a 15 day supply remaining so he thinks he needs another 2 months.

## 2019-12-09 NOTE — Telephone Encounter (Signed)
He shouldn't need refills.  The plan was for 6 months of treatment.  Please check with pharmacy.

## 2019-12-10 NOTE — Assessment & Plan Note (Signed)
Continue GERD prophylaxis with OSA therapy.

## 2019-12-10 NOTE — Telephone Encounter (Signed)
Noted.  Sent.  Thanks. 

## 2019-12-10 NOTE — Assessment & Plan Note (Signed)
Discussed options Plan- refer for oral appliance consideration to treat OSA

## 2020-01-05 ENCOUNTER — Other Ambulatory Visit (INDEPENDENT_AMBULATORY_CARE_PROVIDER_SITE_OTHER): Payer: Medicare HMO

## 2020-01-05 ENCOUNTER — Other Ambulatory Visit: Payer: Self-pay

## 2020-01-05 DIAGNOSIS — D649 Anemia, unspecified: Secondary | ICD-10-CM | POA: Diagnosis not present

## 2020-01-05 LAB — CBC WITH DIFFERENTIAL/PLATELET
Basophils Absolute: 0 10*3/uL (ref 0.0–0.1)
Basophils Relative: 1.3 % (ref 0.0–3.0)
Eosinophils Absolute: 0.3 10*3/uL (ref 0.0–0.7)
Eosinophils Relative: 7.4 % — ABNORMAL HIGH (ref 0.0–5.0)
HCT: 39.7 % (ref 39.0–52.0)
Hemoglobin: 12.9 g/dL — ABNORMAL LOW (ref 13.0–17.0)
Lymphocytes Relative: 44.4 % (ref 12.0–46.0)
Lymphs Abs: 1.6 10*3/uL (ref 0.7–4.0)
MCHC: 32.4 g/dL (ref 30.0–36.0)
MCV: 85.9 fl (ref 78.0–100.0)
Monocytes Absolute: 0.5 10*3/uL (ref 0.1–1.0)
Monocytes Relative: 13.7 % — ABNORMAL HIGH (ref 3.0–12.0)
Neutro Abs: 1.2 10*3/uL — ABNORMAL LOW (ref 1.4–7.7)
Neutrophils Relative %: 33.2 % — ABNORMAL LOW (ref 43.0–77.0)
Platelets: 226 10*3/uL (ref 150.0–400.0)
RBC: 4.63 Mil/uL (ref 4.22–5.81)
RDW: 14 % (ref 11.5–15.5)
WBC: 3.5 10*3/uL — ABNORMAL LOW (ref 4.0–10.5)

## 2020-01-05 LAB — IRON: Iron: 102 ug/dL (ref 42–165)

## 2020-01-05 LAB — FERRITIN: Ferritin: 43.6 ng/mL (ref 22.0–322.0)

## 2020-01-09 ENCOUNTER — Other Ambulatory Visit: Payer: Self-pay | Admitting: Family Medicine

## 2020-01-09 NOTE — Telephone Encounter (Signed)
Please check with pharmacy to make sure this isn't on auto refill.  He should not be running out of medication at this point.  Thanks.

## 2020-01-12 ENCOUNTER — Telehealth: Payer: Self-pay | Admitting: Family Medicine

## 2020-01-12 NOTE — Telephone Encounter (Signed)
Pt called wanting to schedule pneumonia and flu shot at same time.  Ok to schedule pneumonia vaccine

## 2020-01-13 DIAGNOSIS — R69 Illness, unspecified: Secondary | ICD-10-CM | POA: Diagnosis not present

## 2020-01-13 NOTE — Telephone Encounter (Signed)
Pharmacy doesn't know why this was requested.  They say they still have a refill remaining and they will fill that when it is time.

## 2020-01-13 NOTE — Telephone Encounter (Signed)
Should be fine to do both, flu and PNA-13.  Thanks.

## 2020-01-14 NOTE — Telephone Encounter (Signed)
Noted. Thanks.

## 2020-01-15 DIAGNOSIS — R69 Illness, unspecified: Secondary | ICD-10-CM | POA: Diagnosis not present

## 2020-01-20 ENCOUNTER — Ambulatory Visit: Payer: Medicare HMO | Admitting: Internal Medicine

## 2020-01-20 ENCOUNTER — Encounter: Payer: Self-pay | Admitting: Internal Medicine

## 2020-01-20 VITALS — BP 134/68 | HR 69 | Ht 71.0 in | Wt 184.0 lb

## 2020-01-20 DIAGNOSIS — K648 Other hemorrhoids: Secondary | ICD-10-CM | POA: Diagnosis not present

## 2020-01-20 NOTE — Telephone Encounter (Signed)
Called patient to schedule. Pt scheduled for 10/14.

## 2020-01-20 NOTE — Patient Instructions (Signed)
Continue Miralax and Colace as directed.  If your rectal bleeding persists after your come off your xarelto in November, please let us know. We will see you in the office again for possible banding.  If you are age 67 or older, your body mass index should be between 23-30. Your Body mass index is 25.66 kg/m. If this is out of the aforementioned range listed, please consider follow up with your Primary Care Provider.  If you are age 51 or younger, your body mass index should be between 19-25. Your Body mass index is 25.66 kg/m. If this is out of the aformentioned range listed, please consider follow up with your Primary Care Provider.   Due to recent changes in healthcare laws, you may see the results of your imaging and laboratory studies on MyChart before your provider has had a chance to review them.  We understand that in some cases there may be results that are confusing or concerning to you. Not all laboratory results come back in the same time frame and the provider may be waiting for multiple results in order to interpret others.  Please give [place] 48 hours in order for your provider to thoroughly review all the results before contacting the office for clarification of your results.

## 2020-01-21 NOTE — Progress Notes (Signed)
Subjective:    Patient ID: Joel Lopez, male    DOB: 12-20-1950, 69 y.o.   MRN: [id number]  HPI Joel Lopez is a 69 year old male with a history of colon polyps, hemorrhoids, DVT on Xarelto, GERD who is seen to consider hemorrhoidal banding.  He is here alone today.  He saw Carl Best on 11/26/2019.  He has had intermittent rectal bleeding with bowel movement.  This tends to be bright red and painless in nature.  He did have a small papule above/superior to the anus towards the lumbar spine which was tender and bleeding occasionally with wiping.  He has been using MiraLAX and Colace and with this stools have been soft and bleeding has been somewhat less.  He has been on Xarelto since May when he is diagnosed with bilateral DVT after having appendectomy.  The plan is for 6 months of Xarelto may be able to stop next month in November 2021  No abdominal pain   Review of Systems As per HPI, otherwise negative  Current Medications, Allergies, Past Medical History, Past Surgical History, Family History and Social History were reviewed in [employer] record.     Objective:   Physical Exam BP 134/68   Pulse 69   Ht 5\' 11"  (1.803 m)   Wt 184 lb (83.5 kg)   SpO2 98%   BMI 25.66 kg/m  Gen: awake, alert, NAD Rectal: External exam without masses, small prolapsing hemorrhoids in the anal canal are visible, internal exam not performed, superior and right lateral to the anus approximately 2 cm above is a small papule which is nontender Neuro: nonfocal      Assessment & Plan:  69 year old male with a history of colon polyps, hemorrhoids, DVT on Xarelto, GERD who is seen to consider hemorrhoidal banding.  1.  Internal hemorrhoids with bleeding --he is having intermittent hemorrhoidal bleeding related to internal hemorrhoids.  We have discussed this at length today.  We also discussed hemorrhoidal banding.  Hemorrhoidal banding does have a small risk of  bleeding which goes up slightly with Xarelto.  He may in fact be coming off Xarelto in the next 4 to 6 weeks as he will have completed 6 months of anticoagulation for a postoperative DVT.  Once he comes off of Xarelto the hemorrhoidal bleeding may stop now that he is on MiraLAX and Colace.  If his rectal bleeding or internal hemorrhoids remain symptomatic then banding after Xarelto has been stopped will carry less risk of post banding hemorrhage.  That said, if the decision is made by primary care to continue more long-term anticoagulation, I would still be willing to perform hemorrhoidal banding without interrupting anticoagulation therapy.  We discussed this at length today. --Continue MiraLAX and Colace daily --If hemorrhoidal bleeding persists after Xarelto was stopped, contact my office for banding appointments --If decision made to continue Xarelto more long-term and hemorrhoidal bleeding continues, contact my office for banding appointments --He asked about toileting and cleaning after bowel movement and I advised that he use Charmin moisture wipes in place of toilet paper which should be less abrasive --Also he can raise his feet by propping feet on a stool during defecation to help with bowel movement  2.  Small papule above anus is likely a resolving folliculitis/small follicular abscess --no further treatment needed.  He should contact me if this fails to heal completely  30 minutes total spent today including patient facing time, coordination of care, reviewing medical history/procedures/pertinent radiology studies,  and documentation of the encounter.

## 2020-01-22 ENCOUNTER — Ambulatory Visit: Payer: Medicare HMO | Attending: Internal Medicine

## 2020-01-22 DIAGNOSIS — Z23 Encounter for immunization: Secondary | ICD-10-CM

## 2020-01-22 NOTE — Progress Notes (Signed)
   Covid-19 Vaccination Clinic  Name:  Joel Lopez    MRN: [id number] DOB: [birth year]  01/22/2020  [name] was observed post Covid-19 immunization for 15 minutes without incident. He was provided with Vaccine Information Sheet and instruction to access the V-Safe system.   [name] was instructed to call 911 with any severe reactions post vaccine: Marland Kitchen Difficulty breathing  . Swelling of face and throat  . A fast heartbeat  . A bad rash all over body  . Dizziness and weakness

## 2020-01-29 ENCOUNTER — Other Ambulatory Visit: Payer: Self-pay

## 2020-01-29 ENCOUNTER — Ambulatory Visit (INDEPENDENT_AMBULATORY_CARE_PROVIDER_SITE_OTHER): Payer: Medicare HMO

## 2020-01-29 DIAGNOSIS — Z23 Encounter for immunization: Secondary | ICD-10-CM | POA: Diagnosis not present

## 2020-02-05 DIAGNOSIS — H524 Presbyopia: Secondary | ICD-10-CM | POA: Diagnosis not present

## 2020-02-05 DIAGNOSIS — H25813 Combined forms of age-related cataract, bilateral: Secondary | ICD-10-CM | POA: Diagnosis not present

## 2020-02-06 ENCOUNTER — Other Ambulatory Visit: Payer: Self-pay | Admitting: Family Medicine

## 2020-02-06 NOTE — Telephone Encounter (Signed)
Electronic refill request. Xarelto Last office visit:  09/26/2019 Last Filled:     30 tablet 1 12/10/2019

## 2020-02-08 NOTE — Telephone Encounter (Signed)
Sent. Thanks.  The plan was for 6 months of anticoagulation, so he should be able to stop Xarelto in the middle of November.  I did not want him to run out in the meantime.

## 2020-02-09 DIAGNOSIS — Z0100 Encounter for examination of eyes and vision without abnormal findings: Secondary | ICD-10-CM | POA: Diagnosis not present

## 2020-02-09 NOTE — Telephone Encounter (Signed)
Spoke with patient who says he already has enough medication from his last refill to carry him through until mid November.  We discussed that his may have been an auto refill and that if he didn't need it, he could cancel it at the pharmacy.  Patient asks if there will be another test/scan to see if the clots are resolved or no new ones have formed?

## 2020-02-10 DIAGNOSIS — Z0100 Encounter for examination of eyes and vision without abnormal findings: Secondary | ICD-10-CM | POA: Diagnosis not present

## 2020-02-10 NOTE — Telephone Encounter (Signed)
Patient advised.

## 2020-02-10 NOTE — Telephone Encounter (Signed)
Thank you for checking on this.  Usually you do not run another repeat scan unless the patient is having persistent swelling or pain or other localized symptoms.  If he is having such troubles then please let me know.  Thanks.

## 2020-03-04 ENCOUNTER — Other Ambulatory Visit: Payer: Self-pay | Admitting: Family Medicine

## 2020-03-05 NOTE — Telephone Encounter (Signed)
Pharmacy requests refill on: Xarelto 20 mg  LAST REFILL: 02/08/2020 LAST OV: 11/12/2018 NEXT OV: Not Scheduled  PHARMACY: [id number] [place], [place]

## 2020-03-07 NOTE — Telephone Encounter (Signed)
Please check with patient/pharmacy. I think this was an automatic refill that he is not going to need. He was only going to take this for 6 months and then stop. Thanks.

## 2020-03-08 NOTE — Telephone Encounter (Signed)
Called and left voiucemail for patient to call office back.

## 2020-03-08 NOTE — Telephone Encounter (Signed)
Pt called in set up physical and also he does not need the refill

## 2020-03-14 ENCOUNTER — Other Ambulatory Visit: Payer: Self-pay | Admitting: Family Medicine

## 2020-03-14 DIAGNOSIS — Z8639 Personal history of other endocrine, nutritional and metabolic disease: Secondary | ICD-10-CM

## 2020-03-14 DIAGNOSIS — D649 Anemia, unspecified: Secondary | ICD-10-CM

## 2020-03-14 DIAGNOSIS — Z125 Encounter for screening for malignant neoplasm of prostate: Secondary | ICD-10-CM

## 2020-03-22 ENCOUNTER — Other Ambulatory Visit (INDEPENDENT_AMBULATORY_CARE_PROVIDER_SITE_OTHER): Payer: Medicare HMO

## 2020-03-22 ENCOUNTER — Other Ambulatory Visit: Payer: Self-pay

## 2020-03-22 DIAGNOSIS — Z125 Encounter for screening for malignant neoplasm of prostate: Secondary | ICD-10-CM

## 2020-03-22 DIAGNOSIS — D649 Anemia, unspecified: Secondary | ICD-10-CM

## 2020-03-22 DIAGNOSIS — Z8639 Personal history of other endocrine, nutritional and metabolic disease: Secondary | ICD-10-CM | POA: Diagnosis not present

## 2020-03-22 LAB — COMPREHENSIVE METABOLIC PANEL
ALT: 28 U/L (ref 0–53)
AST: 22 U/L (ref 0–37)
Albumin: 4.1 g/dL (ref 3.5–5.2)
Alkaline Phosphatase: 70 U/L (ref 39–117)
BUN: 20 mg/dL (ref 6–23)
CO2: 28 mEq/L (ref 19–32)
Calcium: 9.4 mg/dL (ref 8.4–10.5)
Chloride: 105 mEq/L (ref 96–112)
Creatinine, Ser: 1.09 mg/dL (ref 0.40–1.50)
GFR: 70.22 mL/min (ref >60.00)
Glucose, Bld: 80 mg/dL (ref 70–99)
Potassium: 4.7 mEq/L (ref 3.5–5.1)
Sodium: 139 mEq/L (ref 135–145)
Total Bilirubin: 1 mg/dL (ref 0.2–1.2)
Total Protein: 6.8 g/dL (ref 6.0–8.3)

## 2020-03-22 LAB — CBC WITH DIFFERENTIAL/PLATELET
Basophils Absolute: 0 10*3/uL (ref 0.0–0.1)
Basophils Relative: 1.3 % (ref 0.0–3.0)
Eosinophils Absolute: 0.3 10*3/uL (ref 0.0–0.7)
Eosinophils Relative: 8.9 % — ABNORMAL HIGH (ref 0.0–5.0)
HCT: 39.2 % (ref 39.0–52.0)
Hemoglobin: 12.8 g/dL — ABNORMAL LOW (ref 13.0–17.0)
Lymphocytes Relative: 40.8 % (ref 12.0–46.0)
Lymphs Abs: 1.3 10*3/uL (ref 0.7–4.0)
MCHC: 32.6 g/dL (ref 30.0–36.0)
MCV: 85.7 fl (ref 78.0–100.0)
Monocytes Absolute: 0.4 10*3/uL (ref 0.1–1.0)
Monocytes Relative: 13.2 % — ABNORMAL HIGH (ref 3.0–12.0)
Neutro Abs: 1.1 10*3/uL — ABNORMAL LOW (ref 1.4–7.7)
Neutrophils Relative %: 35.8 % — ABNORMAL LOW (ref 43.0–77.0)
Platelets: 274 10*3/uL (ref 150.0–400.0)
RBC: 4.57 Mil/uL (ref 4.22–5.81)
RDW: 14.3 % (ref 11.5–15.5)
WBC: 3.1 10*3/uL — ABNORMAL LOW (ref 4.0–10.5)

## 2020-03-22 LAB — LIPID PANEL
Cholesterol: 206 mg/dL — ABNORMAL HIGH (ref 0–200)
HDL: 53.7 mg/dL (ref >39.00)
LDL Cholesterol: 142 mg/dL — ABNORMAL HIGH (ref 0–99)
NonHDL: 152.28
Total CHOL/HDL Ratio: 4
Triglycerides: 53 mg/dL (ref 0.0–149.0)
VLDL: 10.6 mg/dL (ref 0.0–40.0)

## 2020-03-22 LAB — PSA, MEDICARE: PSA: 0.77 ng/ml (ref 0.10–4.00)

## 2020-03-26 ENCOUNTER — Other Ambulatory Visit: Payer: Self-pay

## 2020-03-29 ENCOUNTER — Other Ambulatory Visit: Payer: Self-pay

## 2020-03-29 ENCOUNTER — Encounter: Payer: Self-pay | Admitting: Family Medicine

## 2020-03-29 ENCOUNTER — Ambulatory Visit (INDEPENDENT_AMBULATORY_CARE_PROVIDER_SITE_OTHER): Payer: Medicare HMO | Admitting: Family Medicine

## 2020-03-29 VITALS — BP 126/80 | HR 73 | Temp 97.0°F | Resp 16 | Ht 70.0 in | Wt 198.0 lb

## 2020-03-29 DIAGNOSIS — Z86718 Personal history of other venous thrombosis and embolism: Secondary | ICD-10-CM

## 2020-03-29 DIAGNOSIS — R29898 Other symptoms and signs involving the musculoskeletal system: Secondary | ICD-10-CM | POA: Diagnosis not present

## 2020-03-29 DIAGNOSIS — Z0000 Encounter for general adult medical examination without abnormal findings: Secondary | ICD-10-CM | POA: Diagnosis not present

## 2020-03-29 DIAGNOSIS — Z7189 Other specified counseling: Secondary | ICD-10-CM

## 2020-03-29 DIAGNOSIS — Z23 Encounter for immunization: Secondary | ICD-10-CM

## 2020-03-29 DIAGNOSIS — G4733 Obstructive sleep apnea (adult) (pediatric): Secondary | ICD-10-CM

## 2020-03-29 NOTE — Progress Notes (Signed)
This visit occurred during the SARS-CoV-2 public health emergency.  Safety protocols were in place, including screening questions prior to the visit, additional usage of staff PPE, and extensive cleaning of exam room while observing appropriate contact time as indicated for disinfecting solutions.  I have personally reviewed the Medicare Annual Wellness questionnaire and have noted 1. The patient's medical and social history 2. Their use of alcohol, tobacco or illicit drugs 3. Their current medications and supplements 4. The patient's functional ability including ADL's, fall risks, home safety risks and hearing or visual             impairment. 5. Diet and physical activities 6. Evidence for depression or mood disorders  The patients weight, height, BMI have been recorded in the chart and visual acuity is per eye clinic.  I have made referrals, counseling and provided education to the patient based review of the above and I have provided the pt with a written personalized care plan for preventive services.  Provider list updated- see scanned forms.  Routine anticipatory guidance given to patient.  See health maintenance. The possibility exists that previously documented standard health maintenance information may have been brought forward from a previous encounter into this note.  If needed, that same information has been updated to reflect the current situation based on today's encounter.    Flu 2021 Shingles discussed with patient PNA 2021 Tetanus 2020 Covid vaccine 2021 Colon cancer screening 2019 Prostate cancer screening 2021 Living will d/w pt. Would have [name] designated if patient were incapacitated.   Cognitive function addressed- see scanned forms- and if abnormal then additional documentation follows.   History of appendicitis with complicated course.  History of muscular deconditioning he doesn't feel depressed but occ his motivation lags and he isn't as  active those days.  This is not worse but not improving.  He has gone back to playing tennis some and he feels good about getting back to that.  Discussed all this and he can update me as needed.  CPAP per pulmonary.  I'll defer.  Patient is sleeping well.  He'll update me/pulmonary as needed.     History of DVT.  He had full 6 months of anticoagulation.  He feels good about being off xarelto.  No bleeding.  This was a single event of thrombosis, no other lifetime events.    1 episode with painful urination.  This self resolved.  This was 3 weeks ago.  No other similar sx.  No blood seen in urine.   He will update me as needed.  He has some edema that may have improved with salt restriction and inc activity.    PMH and SH reviewed  Meds, vitals, and allergies reviewed.   ROS: Per HPI.  Unless specifically indicated otherwise in HPI, the patient denies:  General: fever. Eyes: acute vision changes ENT: sore throat Cardiovascular: chest pain Respiratory: SOB GI: vomiting GU: dysuria Musculoskeletal: acute back pain Derm: acute rash Neuro: acute motor dysfunction Psych: worsening mood Endocrine: polydipsia Heme: bleeding Allergy: hayfever  GEN: nad, alert and oriented HEENT: mucous membranes moist NECK: supple w/o LA CV: rrr. PULM: ctab, no inc wob, Skin tag x2 inf to R axilla with small amount of subq adipose tissue notes.   ABD: soft, +bs EXT: trace edema- he has minimal sock line BLE  SKIN: no acute rash No clavicular or axillary lymphadenopathy bilaterally.

## 2020-03-29 NOTE — Patient Instructions (Addendum)
Shingles shot today.  Next shot after 05/31/19.   Update me as needed.  Thanks for your effort.  Take care.  Glad to see you.

## 2020-03-30 NOTE — Progress Notes (Signed)
Hearing Screening   125Hz  250Hz  500Hz  1000Hz  2000Hz  3000Hz  4000Hz  6000Hz  8000Hz   Right ear:   20 20 20  20     Left ear:   20 20 20  20     Vision Screening Comments: Patient reports having annual vision screening in early November of this year approximate 02/20/2020 with Dr. Marica Otter in [place].

## 2020-03-31 NOTE — Assessment & Plan Note (Signed)
Living will d/w pt. Would have [name] designated if patient were incapacitated.

## 2020-03-31 NOTE — Assessment & Plan Note (Signed)
CPAP per pulmonary.  I'll defer.  Patient is sleeping well.  He'll update me/pulmonary as needed.

## 2020-03-31 NOTE — Assessment & Plan Note (Addendum)
He is not all the way back to his previous baseline but he is clearly improved compared to the last time I saw him and I think it makes sense to continue as is.  See above.

## 2020-03-31 NOTE — Assessment & Plan Note (Signed)
Flu 2021 Shingles discussed with patient PNA 2021 Tetanus 2020 Covid vaccine 2021 Colon cancer screening 2019 Prostate cancer screening 2021 Living will d/w pt. Would have [name] designated if patient were incapacitated.   Cognitive function addressed- see scanned forms- and if abnormal then additional documentation follows.

## 2020-03-31 NOTE — Assessment & Plan Note (Signed)
History of DVT.  He had full 6 months of anticoagulation.  He feels good about being off xarelto.  No bleeding.  This was a single event of thrombosis, no other lifetime events.  It is reasonable for him to stop anticoagulation.  Discussed.

## 2020-04-26 DIAGNOSIS — R310 Gross hematuria: Secondary | ICD-10-CM | POA: Diagnosis not present

## 2020-04-26 DIAGNOSIS — Z125 Encounter for screening for malignant neoplasm of prostate: Secondary | ICD-10-CM | POA: Diagnosis not present

## 2020-04-26 DIAGNOSIS — N5201 Erectile dysfunction due to arterial insufficiency: Secondary | ICD-10-CM | POA: Diagnosis not present

## 2020-04-26 DIAGNOSIS — R350 Frequency of micturition: Secondary | ICD-10-CM | POA: Diagnosis not present

## 2020-06-01 DIAGNOSIS — R350 Frequency of micturition: Secondary | ICD-10-CM | POA: Diagnosis not present

## 2020-06-01 DIAGNOSIS — R310 Gross hematuria: Secondary | ICD-10-CM | POA: Diagnosis not present

## 2020-06-01 DIAGNOSIS — N5201 Erectile dysfunction due to arterial insufficiency: Secondary | ICD-10-CM | POA: Diagnosis not present

## 2020-06-02 ENCOUNTER — Ambulatory Visit: Payer: Medicare HMO

## 2020-06-16 DIAGNOSIS — H16141 Punctate keratitis, right eye: Secondary | ICD-10-CM | POA: Diagnosis not present

## 2020-09-06 ENCOUNTER — Encounter: Payer: Self-pay | Admitting: Family Medicine

## 2020-09-06 ENCOUNTER — Other Ambulatory Visit: Payer: Self-pay

## 2020-09-06 ENCOUNTER — Ambulatory Visit (INDEPENDENT_AMBULATORY_CARE_PROVIDER_SITE_OTHER): Payer: Medicare HMO | Admitting: Family Medicine

## 2020-09-06 ENCOUNTER — Ambulatory Visit: Payer: Medicare HMO | Admitting: Family Medicine

## 2020-09-06 DIAGNOSIS — U071 COVID-19: Secondary | ICD-10-CM

## 2020-09-06 DIAGNOSIS — R519 Headache, unspecified: Secondary | ICD-10-CM

## 2020-09-06 NOTE — Progress Notes (Signed)
Virtual visit completed through WebEx or similar program Patient location: home  Provider location: [place] at [facility], office  Participants: Patient and me (unless stated otherwise below)  Pandemic considerations d/w pt.   Limitations and rationale for visit method d/w patient.  Patient agreed to proceed.   CC: COVID   HPI: Patient has had Headaches, cough, congestion, fever, chills, body ache since Friday (09/03/20). Patient did home covid test on Friday evening and was positive. Patient has been taking tylenol for the fever.   He is feeling much better today, as of today.  No fevers or aches today, he clearly feels better.  He was prev vaccinated.  No vomiting. Not SOB.  Would defer tx at this point, given the sig improvement.  He'll update that he'll update me as needed.   HA for the last 4-6 weeks.  Slight mild HA- that was the sx that led to scheduling the visit.  L side of the top of the head/L frontal.  No occipital pain.  Could happen at night prior to bed and then wake up with pain.  No throbbing.  No vomiting.  Not noted on the R side of the head.  No double vision.  Wears glasses at baseline, no noticed change with wear glasses or not.  No eye pain.  Some posterior neck pain.  He has an old pillow.  No change sin hearing.  No rash.  No trauma.  Tylenol helped, no HA now.    He had incidental nosebleed a few weeks ago.  Self resolved.    Meds and allergies reviewed.   ROS: Per HPI unless specifically indicated in ROS section   NAD Speech wnl CN 2-12 wnl B, movement wnl x4  A/P:  Headache, likely from occipital neuralgia, differential diagnosis d/w pt. reasonable to try changing pillows, use ice as needed and take prn tylenol.  He will update me as needed.  Defer covid treatment at this point given his improvement.  Risk/benefit discussed with patient and he agrees.  He will update me as needed.

## 2020-09-08 DIAGNOSIS — U071 COVID-19: Secondary | ICD-10-CM | POA: Insufficient documentation

## 2020-09-08 NOTE — Assessment & Plan Note (Signed)
  Defer covid treatment at this point given his improvement.  Risk/benefit discussed with patient and he agrees.  He will update me as needed.

## 2020-09-08 NOTE — Assessment & Plan Note (Signed)
Headache, likely from occipital neuralgia, differential diagnosis d/w pt. reasonable to try changing pillows, use ice as needed and take prn tylenol.  He will update me as needed.

## 2020-10-13 DIAGNOSIS — Z0100 Encounter for examination of eyes and vision without abnormal findings: Secondary | ICD-10-CM | POA: Diagnosis not present

## 2020-10-16 ENCOUNTER — Other Ambulatory Visit: Payer: Self-pay

## 2020-10-16 ENCOUNTER — Ambulatory Visit
Admission: EM | Admit: 2020-10-16 | Discharge: 2020-10-16 | Disposition: A | Discharge status: Home / Self Care | Payer: Medicare HMO | Attending: Family Medicine | Admitting: Family Medicine

## 2020-10-16 ENCOUNTER — Encounter: Payer: Self-pay | Admitting: Emergency Medicine

## 2020-10-16 DIAGNOSIS — J029 Acute pharyngitis, unspecified: Secondary | ICD-10-CM | POA: Insufficient documentation

## 2020-10-16 LAB — POCT RAPID STREP A (OFFICE): Rapid Strep A Screen: NEGATIVE

## 2020-10-16 MED ORDER — PREDNISONE 20 MG PO TABS: 40.0000 mg | ORAL_TABLET | Freq: Every day | ORAL | 0 refills | Status: DC

## 2020-10-16 NOTE — Discharge Instructions (Addendum)
You may use over the counter ibuprofen or acetaminophen as needed.  For a sore throat, over the counter products such as Colgate Peroxyl Mouth Sore Rinse or Chloraseptic Sore Throat Spray may provide some temporary relief. Your rapid strep test was negative today. We have sent your throat swab for culture and will let you know of any positive results. 

## 2020-10-16 NOTE — ED Triage Notes (Signed)
Patient c/o sore throat x 1 week.   Patient denies fever at home.   Patient denies cough, nasal congestion, and headache.   Patient endorses having COVID 19 illness in May of this year.   Patient hasn't taken any medications for symptoms.

## 2020-10-18 NOTE — ED Provider Notes (Signed)
[facility]   [id number] 10/16/20 Arrival Time: 1030  ASSESSMENT & PLAN:  1. Sore throat     No signs of peritonsillar abscess. Discussed. Rapid strep negative. Culture sent.  Given inflammation of throat, begin trial of: Meds ordered this encounter  Medications   predniSONE (DELTASONE) 20 MG tablet    Sig: Take 2 tablets (40 mg total) by mouth daily.    Dispense:  10 tablet    Refill:  0     Discharge Instructions      You may use over the counter ibuprofen or acetaminophen as needed.  For a sore throat, over the counter products such as Colgate Peroxyl Mouth Sore Rinse or Chloraseptic Sore Throat Spray may provide some temporary relief. Your rapid strep test was negative today. We have sent your throat swab for culture and will let you know of any positive results.    Reviewed expectations re: course of current medical issues. Questions answered. Outlined signs and symptoms indicating need for more acute intervention. Patient verbalized understanding. After Visit Summary given.   SUBJECTIVE:  Joel Lopez is a 70 y.o. male who reports a sore throat. Describes as sharp pain, esp with swallowing. Onset gradual beginning  last wek . Symptoms have progressed to a point and plateaued since beginning; without voice changes. No respiratory symptoms. Normal PO intake but reports discomfort with swallowing. No specific alleviating factors. Fever: absent. No neck pain or swelling. No associated nausea, vomiting, or abdominal pain. Known sick contacts: none. Recent travel: none. OTC treatment: none.  Social History   Tobacco Use  Smoking Status Never  Smokeless Tobacco Never     OBJECTIVE:  Vitals:   10/16/20 1215  BP: 136/80  Pulse: (!) 55  Resp: 18  Temp: 97.9 F (36.6 C)  TempSrc: Oral  SpO2: 98%     General appearance: alert; no distress HEENT: throat with moderate erythema and cobblestoning; uvula is midline Neck: supple with FROM; no  lymphadenopathy Lungs: speaks full sentences without difficulty; unlabored Abd: soft; non-tender Skin: reveals no rash; warm and dry Psychological: alert and cooperative; normal mood and affect  No Known Allergies  Past Medical History:  Diagnosis Date   Anemia of other chronic disease    normal blood blood counts since 2007   Arthritis    GERD (gastroesophageal reflux disease)    Hamartomatous polyp of large intestine (HCC)    Hemorrhoids    History of DVT in adulthood    Ileus (HCC)    Internal hemorrhoids    Labyrinthitis    Obstructive sleep apnea    study '07 - AHI 17, lowest O2 sat 86%. not using CPAP, couldn't tolerate CPAP   Plantar fasciitis    Right rotator cuff tear 2013   90% normal function.   Rupture long head biceps tendon 2014   right. 90% normal function   Social History   Socioeconomic History   Marital status: Divorced    Spouse name: Not on file   Number of children: 3   Years of education: 12   Highest education level: Not on file  Occupational History   Occupation: METER TECH    Employer: [employer].  Tobacco Use   Smoking status: Never   Smokeless tobacco: Never  Vaping Use   Vaping Use: Never used  Substance and Sexual Activity   Alcohol use: Yes    Alcohol/week: 0.0 standard drinks    Comment: social   Drug use: No   Sexual activity:  Yes    Partners: Female  Other Topics Concern   Not on file  Social History Narrative   [employer].    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    Lives alone.    Pt has children: 2 sons - '72, '80; 1 dtr -'76; 9 grandchildren, 1 great grandchild   Current relationship status off/on. No complaint of sexual dysfunction. Regular Exercise- yes, tennis 3 x a week. Resistance training. Discussed ACP and referred to [email or web address] for his consideration (Dec '14)    Enjoys playing tennis.    Social Determinants of Health   Financial Resource  Strain: Not on file  Food Insecurity: Not on file  Transportation Needs: Not on file  Physical Activity: Not on file  Stress: Not on file  Social Connections: Not on file  Intimate Partner Violence: Not on file   Family History  Problem Relation Age of Onset   Colon cancer Maternal Aunt    Esophageal cancer Maternal Uncle    Leukemia Mother    Diabetes Neg Hx    Heart disease Neg Hx    Hyperlipidemia Neg Hx    Stroke Neg Hx    Prostate cancer Neg Hx    Rectal cancer Neg Hx    Stomach cancer Neg Hx            Vanessa Kick, MD 10/18/20 [phone]

## 2020-10-19 ENCOUNTER — Telehealth: Payer: Self-pay

## 2020-10-19 LAB — CULTURE, GROUP A STREP (THRC)

## 2020-10-19 NOTE — Telephone Encounter (Signed)
Called and checked on patient. He stated he is doing better but still not 100% better. He has one more day left on the prednisone and states he is going to continue to monitor himself and if not better in a couple of days he would like to call back and make an appt to be seen.

## 2020-10-19 NOTE — Telephone Encounter (Signed)
[facility] - Client TELEPHONE ADVICE RECORD [facility] Patient Name: Joel Lopez Gender: Male DOB: 15-Oct-1950 Age: 70 Y 71 M 7 D Return Phone Number: [phone] (Primary), [phone] (Secondary) Address: City/ State/ Zip: [place]  [address] Client [facility] [facility] - Client Client Site [facility] Physician [name] - MD Contact Type Call Who Is Calling Patient / Member / Family / Caregiver Call Type Triage / Clinical Relationship To Patient Self Return Phone Number [phone] (Primary) Chief Complaint Swallowing Difficulty Reason for Call Symptomatic / Request for [place] states his throat is hurting. He states he can not swallow or eat. Translation No Nurse Assessment Nurse: Thad Ranger, RN, Denise Date/Time (Eastern Time): 10/16/2020 8:31:42 AM Confirm and document reason for call. If symptomatic, describe symptoms. ---Caller states his throat is hurting. He is taking po but has pain. Does the patient have any new or worsening symptoms? ---Yes Will a triage be completed? ---Yes Related visit to physician within the last 2 weeks? ---No Does the PT have any chronic conditions? (i.e. diabetes, asthma, this includes High risk factors for pregnancy, etc.) ---No Is this a behavioral health or substance abuse call? ---No Guidelines Guideline Title Affirmed Question Affirmed Notes Nurse Date/Time [name] Time) Sore Throat SEVERE (e.g., excruciating) throat pain Carmon, RN, Langley Gauss 10/16/2020 8:32:26 AM Disp. Time [name] Time) Disposition Final User 10/16/2020 8:35:43 AM See PCP within 24 Hours Yes Carmon, RN, Yevette Edwards Disagree/Comply Comply Caller Understands Yes PLEASE NOTE: All timestamps contained within this report are represented as [place] Standard Time. CONFIDENTIALTY NOTICE: This fax transmission is intended only for the  addressee. It contains information that is legally privileged, confidential or otherwise protected from use or disclosure. If you are not the intended recipient, you are strictly prohibited from reviewing, disclosing, copying using or disseminating any of this information or taking any action in reliance on or regarding this information. If you have received this fax in error, please notify us immediately by telephone so that we can arrange for its return to [place]. Phone: [phone], Toll-Free: [phone], Fax: [phone] Page: 2 of 2 Call Id: [id number] PreDisposition Call Doctor Care Advice Given Per Guideline SEE PCP WITHIN 24 HOURS: SORE THROAT: * Gargle with warm salt water four times a day. To make salt water, put 1/2 teaspoon of salt in 8 oz (240 ml) of warm water. * Sip warm chicken broth or apple juice. PAIN AND FEVER MEDICINES: * IBUPROFEN (E.G., MOTRIN, ADVIL): Take 400 mg (two 200 mg pills) by mouth every 6 hours. The most you should take each day is 1,200 mg (six 200 mg pills), unless your doctor has told you to take more. SOFT DIET: * Eat a soft diet. DRINK PLENTY OF LIQUIDS: * Drink plenty of liquids. It is important to stay well-hydrated. CALL BACK IF: * You become worse CARE ADVICE given per Sore Throat (Adult) guideline. Referrals GO TO FACILITY UNDECIDED

## 2020-10-19 NOTE — Telephone Encounter (Signed)
Pt was seen at [facility] on 10/16/20 and was tested for strep which was negative, both POCT and culture. Pt was prescribed prednisone and advised to use OTC throat spray for pain relief.

## 2020-10-19 NOTE — Telephone Encounter (Signed)
Please check on patient.  Thanks.

## 2020-10-21 NOTE — Telephone Encounter (Signed)
Patient scheduled with Dr. Silvio Pate tomorrow at 9:00 am.

## 2020-10-21 NOTE — Telephone Encounter (Addendum)
Check with patient and check on schedule- does anyone have an open slot tomorrow?  If no slots on schedule, then see about 130 with me if we can get coverage.  Thanks.

## 2020-10-21 NOTE — Telephone Encounter (Signed)
Patient called in stating that he isnt feeling any better and wanted to know if he can be worked in for an in office visit. He states that his throat is yellow looking, and still a bit sore.

## 2020-10-22 ENCOUNTER — Other Ambulatory Visit: Payer: Self-pay

## 2020-10-22 ENCOUNTER — Encounter: Payer: Self-pay | Admitting: Internal Medicine

## 2020-10-22 ENCOUNTER — Ambulatory Visit (INDEPENDENT_AMBULATORY_CARE_PROVIDER_SITE_OTHER): Payer: Medicare HMO | Admitting: Internal Medicine

## 2020-10-22 VITALS — BP 120/82 | HR 62 | Temp 97.5°F | Ht 71.0 in | Wt 182.0 lb

## 2020-10-22 DIAGNOSIS — Z8709 Personal history of other diseases of the respiratory system: Secondary | ICD-10-CM | POA: Insufficient documentation

## 2020-10-22 NOTE — Progress Notes (Signed)
Subjective:    Patient ID: Joel Lopez, male    DOB: Feb 11, 1951, 70 y.o.   MRN: [id number]  HPI Here due to persistent throat problems This visit occurred during the SARS-CoV-2 public health emergency.  Safety protocols were in place, including screening questions prior to the visit, additional usage of staff PPE, and extensive cleaning of exam room while observing appropriate contact time as indicated for disinfecting solutions.   Had COVID in May---improved but has felt persistent throat symptoms Noticed it more 6/27 Got progressively worse--so seen at urgent care 7/2 Strep negative--got prednisone for a few days (may have helped briefly)  Now feels his throat is "cream colored" The discomfort is better now No trouble swallowing now---able to eat without pain Back of tongue is irritated  Current Outpatient Medications on File Prior to Visit  Medication Sig Dispense Refill   docusate sodium (COLACE) 100 MG capsule Take 100 mg by mouth daily.      Multiple Vitamin (MULTIVITAMIN) tablet Take 1 tablet by mouth daily.     No current facility-administered medications on file prior to visit.    No Known Allergies  Past Medical History:  Diagnosis Date   Anemia of other chronic disease    normal blood blood counts since 2007   Arthritis    GERD (gastroesophageal reflux disease)    Hamartomatous polyp of large intestine (HCC)    Hemorrhoids    History of DVT in adulthood    Ileus ([facility])    Internal hemorrhoids    Labyrinthitis    Obstructive sleep apnea    study '07 - AHI 17, lowest O2 sat 86%. not using CPAP, couldn't tolerate CPAP   Plantar fasciitis    Right rotator cuff tear 2013   90% normal function.   Rupture long head biceps tendon 2014   right. 90% normal function    Past Surgical History:  Procedure Laterality Date   DENTAL SURGERY     implant   INGUINAL HERNIA REPAIR Left 04/26/2018   Procedure: LEFT INGUINAL HERNIA REPAIR WITH MESH;  Surgeon: Jovita Kussmaul, MD;  Location: [facility];  Service: General;  Laterality: Left;   INSERTION OF MESH Left 04/26/2018   Procedure: INSERTION OF MESH;  Surgeon: Jovita Kussmaul, MD;  Location: [facility];  Service: General;  Laterality: Left;   LAPAROSCOPIC APPENDECTOMY N/A 08/12/2019   Procedure: APPENDECTOMY LAPAROSCOPIC;  Surgeon: Donnie Mesa, MD;  Location: [facility];  Service: General;  Laterality: N/A;   ORIF ULNAR FRACTURE Left    had screws and plate; had screws and plate removed 3151    Family History  Problem Relation Age of Onset   Colon cancer Maternal Aunt    Esophageal cancer Maternal Uncle    Leukemia Mother    Diabetes Neg Hx    Heart disease Neg Hx    Hyperlipidemia Neg Hx    Stroke Neg Hx    Prostate cancer Neg Hx    Rectal cancer Neg Hx    Stomach cancer Neg Hx     Social History   Socioeconomic History   Marital status: Divorced    Spouse name: Not on file   Number of children: 3   Years of education: 12   Highest education level: Not on file  Occupational History   Occupation: METER TECH    Employer: [employer].  Tobacco Use   Smoking status: Never   Smokeless tobacco: Never  Vaping Use   Vaping Use: Never used  Substance and Sexual Activity   Alcohol use: Yes    Alcohol/week: 0.0 standard drinks    Comment: social   Drug use: No   Sexual activity: Yes    Partners: Female  Other Topics Concern   Not on file  Social History Narrative   [employer].    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    Lives alone.    Pt has children: 2 sons - '72, '80; 1 dtr -'76; 9 grandchildren, 1 great grandchild   Current relationship status off/on. No complaint of sexual dysfunction. Regular Exercise- yes, tennis 3 x a week. Resistance training. Discussed ACP and referred to [email or web address] for his consideration (Dec '14)    Enjoys playing tennis.    Social Determinants of Health   Financial Resource Strain: Not on file   Food Insecurity: Not on file  Transportation Needs: Not on file  Physical Activity: Not on file  Stress: Not on file  Social Connections: Not on file  Intimate Partner Violence: Not on file   Review of Systems No fever No SOB      Objective:   Physical Exam Constitutional:      Appearance: Normal appearance.  HENT:     Right Ear: Tympanic membrane and ear canal normal.     Left Ear: Tympanic membrane and ear canal normal.     Nose: No congestion or rhinorrhea.     Mouth/Throat:     Comments: No thrush Slight yellow discoloration on hard palate Cardiovascular:     Heart sounds: No murmur heard. Pulmonary:     Effort: Pulmonary effort is normal.     Breath sounds: Normal breath sounds. No wheezing or rales.  Musculoskeletal:     Cervical back: Neck supple.  Lymphadenopathy:     Cervical: No cervical adenopathy.  Neurological:     Mental Status: He is alert.           Assessment & Plan:

## 2020-10-22 NOTE — Assessment & Plan Note (Signed)
Dysphagia is better with prednisone---could have been from [place] 2 months ago Some discoloration of palate--but nothing to suggest infection (like thrush) He is very uncomfortable with this so will set up with ENT for better exam

## 2021-01-20 ENCOUNTER — Encounter (HOSPITAL_COMMUNITY): Payer: Self-pay | Admitting: Emergency Medicine

## 2021-01-20 ENCOUNTER — Ambulatory Visit (INDEPENDENT_AMBULATORY_CARE_PROVIDER_SITE_OTHER): Payer: Medicare HMO

## 2021-01-20 ENCOUNTER — Ambulatory Visit (HOSPITAL_COMMUNITY)
Admission: EM | Admit: 2021-01-20 | Discharge: 2021-01-20 | Disposition: A | Discharge status: Home / Self Care | Payer: Medicare HMO | Attending: Emergency Medicine | Admitting: Emergency Medicine

## 2021-01-20 ENCOUNTER — Ambulatory Visit (HOSPITAL_COMMUNITY): Payer: Medicare HMO

## 2021-01-20 ENCOUNTER — Other Ambulatory Visit: Payer: Self-pay

## 2021-01-20 DIAGNOSIS — S63284A Dislocation of proximal interphalangeal joint of right ring finger, initial encounter: Secondary | ICD-10-CM | POA: Diagnosis not present

## 2021-01-20 DIAGNOSIS — S63289A Dislocation of proximal interphalangeal joint of unspecified finger, initial encounter: Secondary | ICD-10-CM

## 2021-01-20 DIAGNOSIS — S63259A Unspecified dislocation of unspecified finger, initial encounter: Secondary | ICD-10-CM

## 2021-01-20 DIAGNOSIS — M7989 Other specified soft tissue disorders: Secondary | ICD-10-CM | POA: Diagnosis not present

## 2021-01-20 MED ORDER — HYDROCODONE-ACETAMINOPHEN 5-325 MG PO TABS: 1.0000 | ORAL_TABLET | Freq: Four times a day (QID) | ORAL | 0 refills | Status: AC | PRN

## 2021-01-20 MED ORDER — KETOROLAC TROMETHAMINE 30 MG/ML IJ SOLN
INTRAMUSCULAR | Status: AC
  Filled 2021-01-20: qty 1

## 2021-01-20 MED ORDER — LIDOCAINE HCL 1 % IJ SOLN: 5.0000 mL | Freq: Once | INTRAMUSCULAR | Status: DC

## 2021-01-20 MED ORDER — KETOROLAC TROMETHAMINE 30 MG/ML IJ SOLN
30.0000 mg | Freq: Once | INTRAMUSCULAR | Status: AC
  Administered 2021-01-20: 30 mg via INTRAMUSCULAR

## 2021-01-20 MED ORDER — NAPROXEN 500 MG PO TABS: 500.0000 mg | ORAL_TABLET | Freq: Two times a day (BID) | ORAL | 0 refills | Status: DC

## 2021-01-20 NOTE — ED Triage Notes (Signed)
Pt is present today with a right ring finger injury. Pt states that he injured his finger playing tennis. Pt states that it happened on hour ago

## 2021-01-20 NOTE — Consult Note (Addendum)
Reason for Consult:Right ring finger dislocation Referring Physician: Meredeth Ide Time called: [id number] Time at bedside: Joel Lopez is an 70 y.o. male.  HPI: Cherlynn Kaiser was playing tennis today when he swung his racquet into the court. He had immediate right ring finger pain and deformity. He came to [facility] and x-rays showed a PIP dislocation and hand surgery was consulted. He is RHD and retired.  Past Medical History:  Diagnosis Date   Anemia of other chronic disease    normal blood blood counts since 2007   Arthritis    GERD (gastroesophageal reflux disease)    Hamartomatous polyp of large intestine (HCC)    Hemorrhoids    History of DVT in adulthood    Ileus ([place])    Internal hemorrhoids    Labyrinthitis    Obstructive sleep apnea    study '07 - AHI 17, lowest O2 sat 86%. not using CPAP, couldn't tolerate CPAP   Plantar fasciitis    Right rotator cuff tear 2013   90% normal function.   Rupture long head biceps tendon 2014   right. 90% normal function    Past Surgical History:  Procedure Laterality Date   DENTAL SURGERY     implant   INGUINAL HERNIA REPAIR Left 04/26/2018   Procedure: LEFT INGUINAL HERNIA REPAIR WITH MESH;  Surgeon: Jovita Kussmaul, MD;  Location: [place];  Service: General;  Laterality: Left;   INSERTION OF MESH Left 04/26/2018   Procedure: INSERTION OF MESH;  Surgeon: Jovita Kussmaul, MD;  Location: [place];  Service: General;  Laterality: Left;   LAPAROSCOPIC APPENDECTOMY N/A 08/12/2019   Procedure: APPENDECTOMY LAPAROSCOPIC;  Surgeon: Donnie Mesa, MD;  Location: [place];  Service: General;  Laterality: N/A;   ORIF ULNAR FRACTURE Left    had screws and plate; had screws and plate removed [id number]    Family History  Problem Relation Age of Onset   Colon cancer Maternal Aunt    Esophageal cancer Maternal Uncle    Leukemia Mother    Diabetes Neg Hx    Heart disease Neg Hx    Hyperlipidemia Neg Hx    Stroke Neg Hx    Prostate cancer Neg Hx    Rectal  cancer Neg Hx    Stomach cancer Neg Hx     Social History:  reports that he has never smoked. He has never used smokeless tobacco. He reports current alcohol use. He reports that he does not use drugs.  Allergies: No Known Allergies  Medications: I have reviewed the patient's current medications.  No results found for this or any previous visit (from the past 48 hour(s)).  DG Finger Ring Right  Result Date: 01/20/2021 CLINICAL DATA:  Right ring finger injury playing tennis EXAM: RIGHT RING FINGER 2+V COMPARISON:  02/15/2018 FINDINGS: Dorsal dislocation of the right ring finger proximal interphalangeal joint. No definite fracture. Remaining osseous structures are intact. There is soft tissue swelling of the ring finger. IMPRESSION: 1. Dorsal dislocation at the right ring finger proximal interphalangeal joint. 2. No definite fracture.  Attention on postreduction views. Electronically Signed   By: Davina Poke D.O.   On: 01/20/2021 13:19    Review of Systems  HENT:  Negative for ear discharge, ear pain, hearing loss and tinnitus.   Eyes:  Negative for photophobia and pain.  Respiratory:  Negative for cough and shortness of breath.   Cardiovascular:  Negative for chest pain.  Gastrointestinal:  Negative for abdominal pain, nausea and  vomiting.  Genitourinary:  Negative for dysuria, flank pain, frequency and urgency.  Musculoskeletal:  Positive for arthralgias (Right ring finger). Negative for back pain, myalgias and neck pain.  Neurological:  Negative for dizziness and headaches.  Hematological:  Does not bruise/bleed easily.  Psychiatric/Behavioral:  The patient is not nervous/anxious.   Blood pressure 136/76, pulse 66, temperature 98.3 F (36.8 C), resp. rate 18, SpO2 98 %. Physical Exam Constitutional:      General: He is not in acute distress.    Appearance: He is well-developed. He is not diaphoretic.  HENT:     Head: Normocephalic and atraumatic.  Eyes:     General: No  scleral icterus.       Right eye: No discharge.        Left eye: No discharge.     Conjunctiva/sclera: Conjunctivae normal.  Cardiovascular:     Rate and Rhythm: Normal rate and regular rhythm.  Pulmonary:     Effort: Pulmonary effort is normal. No respiratory distress.  Musculoskeletal:     Cervical back: Normal range of motion.     Comments: Right shoulder, elbow, wrist, digits- no skin wounds, ring finger PIP deformity, mod TTP, no other instability or blocks to motion  Sens  Ax/R/M/U intact  Mot   Ax/ R/ PIN/ M/ AIN/ U intact  Rad 2+  Skin:    General: Skin is warm and dry.  Neurological:     Mental Status: He is alert.  Psychiatric:        Mood and Affect: Mood normal.        Behavior: Behavior normal.    Assessment/Plan: Right ring finger PIP dislocation -- Will attempt CR under digital block. If successful will splint and have f/u with Dr. Fredna Dow next week.    Lisette Abu, PA-C Orthopedic Surgery [phone] 01/20/2021, 1:50 PM

## 2021-01-20 NOTE — ED Provider Notes (Signed)
[facility]    CSN: [id number] Arrival date & time: 01/20/21  1203      History   Chief Complaint Chief Complaint  Patient presents with   Finger Injury    Right     HPI Joel Lopez is a 70 y.o. male.   Patient presents with right fourth finger pain beginning today after fall.  Finger alignment skewed.  Endorses numbness of finger. has not attempted treatment.  No history of prior injury.   Past Medical History:  Diagnosis Date   Anemia of other chronic disease    normal blood blood counts since 2007   Arthritis    GERD (gastroesophageal reflux disease)    Hamartomatous polyp of large intestine (HCC)    Hemorrhoids    History of DVT in adulthood    Ileus ([place])    Internal hemorrhoids    Labyrinthitis    Obstructive sleep apnea    study '07 - AHI 17, lowest O2 sat 86%. not using CPAP, couldn't tolerate CPAP   Plantar fasciitis    Right rotator cuff tear 2013   90% normal function.   Rupture long head biceps tendon 2014   right. 90% normal function    Patient Active Problem List   Diagnosis Date Noted   History of throat problem 10/22/2020   COVID 09/08/2020   Muscular deconditioning 09/02/2019   History of DVT (deep vein thrombosis) 08/27/2019   History of appendicitis 08/12/2019   Right hand pain 02/17/2018   Headache 11/18/2015   Advance care planning 01/15/2015   Incomplete tear of rotator cuff 08/17/2011   Medicare annual wellness visit, initial 01/11/2011   GERD 09/18/2007   Obstructive sleep apnea 04/17/2007    Past Surgical History:  Procedure Laterality Date   DENTAL SURGERY     implant   INGUINAL HERNIA REPAIR Left 04/26/2018   Procedure: LEFT INGUINAL HERNIA REPAIR WITH MESH;  Surgeon: Jovita Kussmaul, MD;  Location: [place];  Service: General;  Laterality: Left;   INSERTION OF MESH Left 04/26/2018   Procedure: INSERTION OF MESH;  Surgeon: Jovita Kussmaul, MD;  Location: [place];  Service: General;  Laterality: Left;   LAPAROSCOPIC  APPENDECTOMY N/A 08/12/2019   Procedure: APPENDECTOMY LAPAROSCOPIC;  Surgeon: Donnie Mesa, MD;  Location: [place];  Service: General;  Laterality: N/A;   ORIF ULNAR FRACTURE Left    had screws and plate; had screws and plate removed [id number]       Home Medications    Prior to Admission medications   Medication Sig Start Date End Date Taking? Authorizing Provider  docusate sodium (COLACE) 100 MG capsule Take 100 mg by mouth daily.     [provider]  Multiple Vitamin (MULTIVITAMIN) tablet Take 1 tablet by mouth daily.    [provider]    Family History Family History  Problem Relation Age of Onset   Colon cancer Maternal Aunt    Esophageal cancer Maternal Uncle    Leukemia Mother    Diabetes Neg Hx    Heart disease Neg Hx    Hyperlipidemia Neg Hx    Stroke Neg Hx    Prostate cancer Neg Hx    Rectal cancer Neg Hx    Stomach cancer Neg Hx     Social History Social History   Tobacco Use   Smoking status: Never   Smokeless tobacco: Never  Vaping Use   Vaping Use: Never used  Substance Use Topics   Alcohol use: Yes  Alcohol/week: 0.0 standard drinks    Comment: social   Drug use: No     Allergies   Patient has no known allergies.   Review of Systems Review of Systems  Constitutional: Negative.   Respiratory: Negative.    Cardiovascular: Negative.   Neurological: Negative.     Physical Exam Triage Vital Signs ED Triage Vitals  Enc Vitals Group     BP 01/20/21 1241 136/76     Pulse Rate 01/20/21 1241 66     Resp 01/20/21 1241 18     Temp 01/20/21 1241 98.3 F (36.8 C)     Temp src --      SpO2 01/20/21 1241 98 %     Weight --      Height --      Head Circumference --      Peak Flow --      Pain Score 01/20/21 1240 4     Pain Loc --      Pain Edu? --      Excl. in [place]? --    No data found.  Updated Vital Signs BP 136/76   Pulse 66   Temp 98.3 F (36.8 C)   Resp 18   SpO2 98%   Visual Acuity Right Eye Distance:   Left  Eye Distance:   Bilateral Distance:    Right Eye Near:   Left Eye Near:    Bilateral Near:     Physical Exam Constitutional:      Appearance: Normal appearance. He is normal weight.  HENT:     Head: Normocephalic.  Eyes:     Extraocular Movements: Extraocular movements intact.  Pulmonary:     Effort: Pulmonary effort is normal.  Musculoskeletal:     Comments: Finger dislocated at the proximal phalanx joint of the right fourth finger, decreased sensation, 2+ radial pulse, no tenderness, ecchymosis or erythema noted, unable to complete range of motion  Skin:    General: Skin is warm and dry.  Neurological:     Mental Status: He is alert and oriented to person, place, and time. Mental status is at baseline.  Psychiatric:        Mood and Affect: Mood normal.        Behavior: Behavior normal.     UC Treatments / Results  Labs (all labs ordered are listed, but only abnormal results are displayed) Labs Reviewed - No data to display  EKG   Radiology DG Finger Ring Right  Result Date: 01/20/2021 CLINICAL DATA:  Right ring finger injury playing tennis EXAM: RIGHT RING FINGER 2+V COMPARISON:  02/15/2018 FINDINGS: Dorsal dislocation of the right ring finger proximal interphalangeal joint. No definite fracture. Remaining osseous structures are intact. There is soft tissue swelling of the ring finger. IMPRESSION: 1. Dorsal dislocation at the right ring finger proximal interphalangeal joint. 2. No definite fracture.  Attention on postreduction views. Electronically Signed   By: Davina Poke D.O.   On: 01/20/2021 13:19    Procedures Procedures (including critical care time)  Medications Ordered in UC Medications  lidocaine (XYLOCAINE) 1 % (with pres) injection 5 mL (has no administration in time range)  ketorolac (TORADOL) 30 MG/ML injection 30 mg (has no administration in time range)    Initial Impression / Assessment and Plan / UC Course  I have reviewed the triage vital  signs and the nursing notes.  Pertinent labs & imaging results that were available during my care of the patient were reviewed by me and  considered in my medical decision making (see chart for details).  Close dislocation of finger right hand, initial encounter  Digital block and reduction completed by Hilbert Odor Initial x-ray confirmed dislocation, post reduction x-ray completed 3.  Buddy tape and splint placed by nursing staff, neurovascularly intact prior to and after placement, patient instructed to leave in place until seen by orthopedic specialist, nonweightbearing 4.  Naproxen 500 mg twice daily as needed 5.  Oxycodone-acetaminophen 5-3 25 every 6 hours as needed, PDMP reviewed, low risk 6.  Orthopedic follow-up in 1 week Final Clinical Impressions(s) / UC Diagnoses   Final diagnoses:  None   Discharge Instructions   None    ED Prescriptions   None    PDMP not reviewed this encounter.   Hans Eden, NP 01/20/21 1425

## 2021-01-20 NOTE — Discharge Instructions (Addendum)
Your x-ray today showed a discolation ( break in bone) of finger. Which has been set back in place today.   You may use naproxen twice a day as needed for comfort  You may use hydrocodone-acetaminophen every 6 hours as needed for pain, you have been dispensed 12 pills, use sparingly  A splint has been applied to your finger. This is used to protect your injury and prevent further damage. Leave in place until seen by orthopedic specialist. Do not put objects into splint to scratch skin. If numbness or tingling occurs after placement please return to Urgent Care for evaluation.   Follow-up with orthopedic doctor in 1 week  Please do not put weight on fracture.

## 2021-01-20 NOTE — Procedures (Signed)
Procedure: Right ring PIP joint closed reduction   Indication: Right ring PIP joint dislocation   Surgeon: Silvestre Gunner, PA-C   Assist: None   Anesthesia: 1% plain lidocaine as digital block   EBL: None   Complications: None   Findings: After risks/benefits explained patient desires to undergo procedure. Verbal consent obtained (no consent forms available) and time out performed. After digital block with 42ml lidocaine via tendon sheath the joint was relocated without difficulty. Pt tolerated the procedure well. Post-reduction films pending.       Lisette Abu, PA-C Orthopedic Surgery [phone]

## 2021-01-21 ENCOUNTER — Ambulatory Visit: Payer: Medicare HMO

## 2021-01-26 DIAGNOSIS — S63284A Dislocation of proximal interphalangeal joint of right ring finger, initial encounter: Secondary | ICD-10-CM | POA: Diagnosis not present

## 2021-02-09 DIAGNOSIS — S63284A Dislocation of proximal interphalangeal joint of right ring finger, initial encounter: Secondary | ICD-10-CM | POA: Diagnosis not present

## 2021-02-15 DIAGNOSIS — H52221 Regular astigmatism, right eye: Secondary | ICD-10-CM | POA: Diagnosis not present

## 2021-02-15 DIAGNOSIS — H25013 Cortical age-related cataract, bilateral: Secondary | ICD-10-CM | POA: Diagnosis not present

## 2021-02-23 DIAGNOSIS — S63284A Dislocation of proximal interphalangeal joint of right ring finger, initial encounter: Secondary | ICD-10-CM | POA: Diagnosis not present

## 2021-03-16 ENCOUNTER — Emergency Department (HOSPITAL_COMMUNITY): Payer: Medicare HMO

## 2021-03-16 ENCOUNTER — Telehealth: Payer: Self-pay

## 2021-03-16 ENCOUNTER — Encounter (HOSPITAL_COMMUNITY): Payer: Self-pay | Admitting: Emergency Medicine

## 2021-03-16 ENCOUNTER — Emergency Department (HOSPITAL_COMMUNITY)
Admission: EM | Admit: 2021-03-16 | Discharge: 2021-03-17 | Disposition: A | Discharge status: Home / Self Care | Payer: Medicare HMO | Attending: Emergency Medicine | Admitting: Emergency Medicine

## 2021-03-16 ENCOUNTER — Other Ambulatory Visit: Payer: Self-pay

## 2021-03-16 DIAGNOSIS — Z20822 Contact with and (suspected) exposure to covid-19: Secondary | ICD-10-CM | POA: Diagnosis not present

## 2021-03-16 DIAGNOSIS — R000 Tachycardia, unspecified: Secondary | ICD-10-CM | POA: Insufficient documentation

## 2021-03-16 DIAGNOSIS — R1011 Right upper quadrant pain: Secondary | ICD-10-CM | POA: Diagnosis not present

## 2021-03-16 DIAGNOSIS — K7689 Other specified diseases of liver: Secondary | ICD-10-CM | POA: Diagnosis not present

## 2021-03-16 DIAGNOSIS — J189 Pneumonia, unspecified organism: Secondary | ICD-10-CM

## 2021-03-16 DIAGNOSIS — R509 Fever, unspecified: Secondary | ICD-10-CM | POA: Diagnosis not present

## 2021-03-16 DIAGNOSIS — R0602 Shortness of breath: Secondary | ICD-10-CM | POA: Diagnosis not present

## 2021-03-16 DIAGNOSIS — N400 Enlarged prostate without lower urinary tract symptoms: Secondary | ICD-10-CM | POA: Diagnosis not present

## 2021-03-16 DIAGNOSIS — J181 Lobar pneumonia, unspecified organism: Secondary | ICD-10-CM | POA: Diagnosis not present

## 2021-03-16 DIAGNOSIS — Z8616 Personal history of COVID-19: Secondary | ICD-10-CM | POA: Insufficient documentation

## 2021-03-16 DIAGNOSIS — J90 Pleural effusion, not elsewhere classified: Secondary | ICD-10-CM | POA: Diagnosis not present

## 2021-03-16 LAB — CBC WITH DIFFERENTIAL/PLATELET
Abs Immature Granulocytes: 0.02 10*3/uL (ref 0.00–0.07)
Basophils Absolute: 0 10*3/uL (ref 0.0–0.1)
Basophils Relative: 0 %
Eosinophils Absolute: 0.1 10*3/uL (ref 0.0–0.5)
Eosinophils Relative: 2 %
HCT: 42.7 % (ref 39.0–52.0)
Hemoglobin: 13.6 g/dL (ref 13.0–17.0)
Immature Granulocytes: 0 %
Lymphocytes Relative: 18 %
Lymphs Abs: 1.3 10*3/uL (ref 0.7–4.0)
MCH: 27.8 pg (ref 26.0–34.0)
MCHC: 31.9 g/dL (ref 30.0–36.0)
MCV: 87.1 fL (ref 80.0–100.0)
Monocytes Absolute: 1.1 10*3/uL — ABNORMAL HIGH (ref 0.1–1.0)
Monocytes Relative: 15 %
Neutro Abs: 4.8 10*3/uL (ref 1.7–7.7)
Neutrophils Relative %: 65 %
Platelets: 267 10*3/uL (ref 150–400)
RBC: 4.9 MIL/uL (ref 4.22–5.81)
RDW: 14 % (ref 11.5–15.5)
WBC: 7.3 10*3/uL (ref 4.0–10.5)
nRBC: 0 % (ref 0.0–0.2)

## 2021-03-16 LAB — COMPREHENSIVE METABOLIC PANEL
ALT: 19 U/L (ref 0–44)
AST: 22 U/L (ref 15–41)
Albumin: 3.8 g/dL (ref 3.5–5.0)
Alkaline Phosphatase: 71 U/L (ref 38–126)
Anion gap: 9 (ref 5–15)
BUN: 15 mg/dL (ref 8–23)
CO2: 26 mmol/L (ref 22–32)
Calcium: 9.6 mg/dL (ref 8.9–10.3)
Chloride: 102 mmol/L (ref 98–111)
Creatinine, Ser: 0.93 mg/dL (ref 0.61–1.24)
GFR, Estimated: >60 mL/min (ref >60)
Glucose, Bld: 119 mg/dL — ABNORMAL HIGH (ref 70–99)
Potassium: 3.9 mmol/L (ref 3.5–5.1)
Sodium: 137 mmol/L (ref 135–145)
Total Bilirubin: 1.4 mg/dL — ABNORMAL HIGH (ref 0.3–1.2)
Total Protein: 7.4 g/dL (ref 6.5–8.1)

## 2021-03-16 LAB — LIPASE, BLOOD: Lipase: 31 U/L (ref 11–51)

## 2021-03-16 LAB — TROPONIN I (HIGH SENSITIVITY): Troponin I (High Sensitivity): 3 ng/L (ref <18)

## 2021-03-16 NOTE — Telephone Encounter (Signed)
[facility] - Client TELEPHONE ADVICE RECORD [facility] Patient Name: Joel Lopez Gender: Male DOB: [birth year] Age: 70 Y 53 M 5 D Return Phone Number: [phone] (Primary), [phone] (Secondary) Address: City/ State/ Zip: [place]  [address] Client [place] [facility] - Client Client Site [place] - Day Provider [name] - MD Contact Type Call Who Is Calling Patient / Member / Family / Caregiver Call Type Triage / Clinical Relationship To Patient Self Return Phone Number [phone] (Primary) Chief Complaint BREATHING - shortness of breath or sounds breathless Reason for Call Symptomatic / Request for [name] states she is calling from the office. pt on the line experiencing shortness of breath, fever temp of 99 and back pain and chills Translation No Nurse Assessment Nurse: [name], RN, [name] Date/Time (Eastern Time): 03/16/2021 2:56:43 PM Confirm and document reason for call. If symptomatic, describe symptoms. ---Caller states having shortness of breath, fever temp of 99.9, and middle to lower back pain and chills. Symptoms started yesterday. Covid test was negative yesterday and he has no cough. Does the patient have any new or worsening symptoms? ---Yes Will a triage be completed? ---Yes Related visit to physician within the last 2 weeks? ---No Does the PT have any chronic conditions? (i.e. diabetes, asthma, this includes High risk factors for pregnancy, etc.) ---No Is this a behavioral health or substance abuse call? ---No Guidelines Guideline Title Affirmed Question Affirmed Notes Nurse Date/Time (Eastern Time) Breathing Difficulty History of prior "blood clot" in leg or lungs (i.e., deep vein thrombosis, pulmonary embolism) [name], RN, [name] 03/16/2021 2:59:49 PM Disp. Time [name] Time) Disposition Final User 03/16/2021  2:55:04 PM Send to Urgent [name], [name] PLEASE NOTE: All timestamps contained within this report are represented as [place] Standard Time. CONFIDENTIALTY NOTICE: This fax transmission is intended only for the addressee. It contains information that is legally privileged, confidential or otherwise protected from use or disclosure. If you are not the intended recipient, you are strictly prohibited from reviewing, disclosing, copying using or disseminating any of this information or taking any action in reliance on or regarding this information. If you have received this fax in error, please notify us immediately by telephone so that we can arrange for its return to [place]. Phone: [phone], Toll-Free: [phone], Fax: [phone] Page: 2 of 2 Call Id: [id number] 03/16/2021 3:04:10 PM Go to ED Now Yes [name], RN, [name] Disagree/Comply Comply Caller Understands Yes PreDisposition Call Doctor Care Advice Given Per Guideline GO TO ED NOW: * You need to be seen in the Emergency Department. * Go to the ED at ___________ [place] now. Drive carefully. NOTE TO TRIAGER - DRIVING: * Another adult should drive. * Patient should not delay going to the emergency department. * Call EMS if you become worse. CARE ADVICE given per Breathing Difficulty (Adult) guideline. CALL EMS 911 IF: Referrals [place]

## 2021-03-16 NOTE — Telephone Encounter (Signed)
Noted. Will await ER eval.  

## 2021-03-16 NOTE — Telephone Encounter (Signed)
I spoke with pt;pt had SOB last night and had lower chest pain or abd pain and back pain when took deep breath; pt had fever and took advil and that decreased fever and pain last night but pain is returning now. Pt is going to [facility] ED this afternoon. Sending note to DR Damita Dunnings who is out of office and Dr Darnell Level who is in office and Janett Billow CMA.

## 2021-03-16 NOTE — Telephone Encounter (Signed)
Agree with ER eval.  Thanks.  

## 2021-03-16 NOTE — ED Triage Notes (Signed)
Pt here for R side pain that radiates to RLQ, pt reports this pain is worse w/ deep breaths. Pt states he was very short of breath last night while trying to go to sleep. Pt also had fever of 100F yesterday. Hx DVTs last year after having an appendectomy.

## 2021-03-16 NOTE — ED Provider Notes (Signed)
Emergency Medicine Provider Triage Evaluation Note  Joel Lopez , a 70 y.o. male  was evaluated in triage.  Pt complains of sob, pleuritic pain, abd pain. Had fever yesterday that resolved today.  Review of Systems  Positive: Sob, pleuritic pain, abd pain, fever Negative: cough  Physical Exam  BP (!) 151/83 (BP Location: Left Arm)   Pulse 88   Temp 99.9 F (37.7 C) (Oral)   Resp 18   SpO2 100%  Gen:   Awake, no distress   Resp:  Normal effort  MSK:   Moves extremities without difficulty  Other:  Heart with rrr, lungs ctab, abd soft and nontender  Medical Decision Making  Medically screening exam initiated at 6:57 PM.  Appropriate orders placed.  Joel Lopez was informed that the remainder of the evaluation will be completed by another provider, this initial triage assessment does not replace that evaluation, and the importance of remaining in the ED until their evaluation is complete.     Bishop Dublin 03/16/21 1857    Lacretia Leigh, MD 03/17/21 1546

## 2021-03-17 ENCOUNTER — Emergency Department (HOSPITAL_COMMUNITY): Payer: Medicare HMO

## 2021-03-17 DIAGNOSIS — N400 Enlarged prostate without lower urinary tract symptoms: Secondary | ICD-10-CM | POA: Diagnosis not present

## 2021-03-17 DIAGNOSIS — K7689 Other specified diseases of liver: Secondary | ICD-10-CM | POA: Diagnosis not present

## 2021-03-17 LAB — RESP PANEL BY RT-PCR (FLU A&B, COVID) ARPGX2
Influenza A by PCR: NEGATIVE
Influenza B by PCR: NEGATIVE
SARS Coronavirus 2 by RT PCR: NEGATIVE

## 2021-03-17 LAB — URINALYSIS, ROUTINE W REFLEX MICROSCOPIC
Bilirubin Urine: NEGATIVE
Glucose, UA: NEGATIVE mg/dL
Hgb urine dipstick: NEGATIVE
Ketones, ur: NEGATIVE mg/dL
Leukocytes,Ua: NEGATIVE
Nitrite: NEGATIVE
Protein, ur: NEGATIVE mg/dL
Specific Gravity, Urine: >1.03 — ABNORMAL HIGH (ref 1.005–1.030)
pH: 5.5 (ref 5.0–8.0)

## 2021-03-17 LAB — TROPONIN I (HIGH SENSITIVITY): Troponin I (High Sensitivity): 3 ng/L (ref <18)

## 2021-03-17 MED ORDER — DOXYCYCLINE HYCLATE 100 MG PO CAPS: 100.0000 mg | ORAL_CAPSULE | Freq: Two times a day (BID) | ORAL | 0 refills | Status: AC

## 2021-03-17 MED ORDER — SODIUM CHLORIDE 0.9 % IV SOLN
1.0000 g | Freq: Once | INTRAVENOUS | Status: AC
  Administered 2021-03-17: 1 g via INTRAVENOUS
  Filled 2021-03-17: qty 10

## 2021-03-17 MED ORDER — DOXYCYCLINE HYCLATE 100 MG PO TABS
100.0000 mg | ORAL_TABLET | Freq: Once | ORAL | Status: AC
  Administered 2021-03-17: 100 mg via ORAL
  Filled 2021-03-17: qty 1

## 2021-03-17 MED ORDER — IOHEXOL 300 MG/ML  SOLN
100.0000 mL | Freq: Once | INTRAMUSCULAR | Status: AC | PRN
  Administered 2021-03-17: 100 mL via INTRAVENOUS

## 2021-03-17 NOTE — ED Provider Notes (Signed)
[facility] EMERGENCY DEPARTMENT Provider Note   CSN: [id number] Arrival date & time: 03/16/21  1730     History Chief Complaint  Patient presents with   Shortness of Breath   Abdominal Pain    Joel Lopez is a 70 y.o. male with a history of provoked, postsurgical DVT, no longer on anticoagulation, history of appendectomy, presenting to the emergency department with shortness of breath and fever.  Patient reports gradual onset of right upper quadrant and right flank pain 2 nights ago.  He says the pain is mostly located in the right flank, was very significant, cannot sleep at all.  Seem to be worse with inspiration.  He said "it felt a little bit like when my appendix was inflamed".  He reports he took a Tylenol and the pain seemed to ease up.  Yesterday the pain was waxing and waning all day.  It seems to have gone away.  This morning he reports the pain seems to have traveled down and radiates towards his right groin, now in the lower abdomen.  The pain is very minimal.  He denies dysuria, hematuria, or history of kidney stone.  He denies nausea, vomiting, diarrhea.  He does report he had a mild headache in the waiting room overnight, but denies any other recent infectious symptoms, including cough, sore throat, myalgias.  He denies sick contacts in the house.  He did have a prolonged wait in the waiting room last night, approximately 13 hours prior to my evaluation in the morning.  He says the pain has been minimal throughout the evening.  He is here with his son at bedside.  HPI     Past Medical History:  Diagnosis Date   Anemia of other chronic disease    normal blood blood counts since 2007   Arthritis    GERD (gastroesophageal reflux disease)    Hamartomatous polyp of large intestine (HCC)    Hemorrhoids    History of DVT in adulthood    Ileus ([place])    Internal hemorrhoids    Labyrinthitis    Obstructive sleep apnea    study '07 - AHI 17, lowest O2  sat 86%. not using CPAP, couldn't tolerate CPAP   Plantar fasciitis    Right rotator cuff tear 2013   90% normal function.   Rupture long head biceps tendon 2014   right. 90% normal function    Patient Active Problem List   Diagnosis Date Noted   History of throat problem 10/22/2020   COVID 09/08/2020   Muscular deconditioning 09/02/2019   History of DVT (deep vein thrombosis) 08/27/2019   History of appendicitis 08/12/2019   Right hand pain 02/17/2018   Headache 11/18/2015   Advance care planning 01/15/2015   Incomplete tear of rotator cuff 08/17/2011   Medicare annual wellness visit, initial 01/11/2011   GERD 09/18/2007   Obstructive sleep apnea 04/17/2007    Past Surgical History:  Procedure Laterality Date   APPENDECTOMY     DENTAL SURGERY     implant   INGUINAL HERNIA REPAIR Left 04/26/2018   Procedure: LEFT INGUINAL HERNIA REPAIR WITH MESH;  Surgeon: Jovita Kussmaul, MD;  Location: [facility];  Service: General;  Laterality: Left;   INSERTION OF MESH Left 04/26/2018   Procedure: INSERTION OF MESH;  Surgeon: Jovita Kussmaul, MD;  Location: [facility];  Service: General;  Laterality: Left;   LAPAROSCOPIC APPENDECTOMY N/A 08/12/2019   Procedure: APPENDECTOMY LAPAROSCOPIC;  Surgeon: Donnie Mesa, MD;  Location: [facility];  Service: General;  Laterality: N/A;   ORIF ULNAR FRACTURE Left    had screws and plate; had screws and plate removed 2409       Family History  Problem Relation Age of Onset   Colon cancer Maternal Aunt    Esophageal cancer Maternal Uncle    Leukemia Mother    Diabetes Neg Hx    Heart disease Neg Hx    Hyperlipidemia Neg Hx    Stroke Neg Hx    Prostate cancer Neg Hx    Rectal cancer Neg Hx    Stomach cancer Neg Hx     Social History   Tobacco Use   Smoking status: Never   Smokeless tobacco: Never  Vaping Use   Vaping Use: Never used  Substance Use Topics   Alcohol use: Yes    Alcohol/week: 0.0 standard drinks    Comment: social   Drug use: No     Home Medications Prior to Admission medications   Medication Sig Start Date End Date Taking? Authorizing Provider  docusate sodium (COLACE) 100 MG capsule Take 100 mg by mouth daily as needed for mild constipation.   Yes [provider]  doxycycline (VIBRAMYCIN) 100 MG capsule Take 1 capsule (100 mg total) by mouth 2 (two) times daily for 7 days. 03/17/21 03/24/21 Yes Italia Wolfert, Carola Rhine, MD  ibuprofen (ADVIL) 200 MG tablet Take 200 mg by mouth every 6 (six) hours as needed for mild pain, headache or fever.   Yes [provider]  Multiple Vitamin (MULTIVITAMIN) tablet Take 1 tablet by mouth daily.    [provider]  naproxen (NAPROSYN) 500 MG tablet Take 1 tablet (500 mg total) by mouth 2 (two) times daily. Patient not taking: Reported on 03/17/2021 01/20/21   Hans Eden, NP    Allergies    Shellfish allergy  Review of Systems   Review of Systems  Constitutional:  Positive for chills and fever.  HENT:  Negative for ear pain and sore throat.   Eyes:  Negative for pain and visual disturbance.  Respiratory:  Positive for shortness of breath. Negative for cough.   Cardiovascular:  Negative for chest pain and leg swelling.  Gastrointestinal:  Negative for abdominal pain and vomiting.  Genitourinary:  Positive for flank pain. Negative for dysuria and hematuria.  Musculoskeletal:  Negative for arthralgias and back pain.  Skin:  Negative for color change and rash.  Neurological:  Positive for headaches. Negative for syncope.  All other systems reviewed and are negative.  Physical Exam Updated Vital Signs BP 125/72   Pulse 71   Temp 99.9 F (37.7 C) (Oral)   Resp (!) 24   SpO2 98%   Physical Exam Constitutional:      General: He is not in acute distress. HENT:     Head: Normocephalic and atraumatic.  Eyes:     Conjunctiva/sclera: Conjunctivae normal.     Pupils: Pupils are equal, round, and reactive to light.  Cardiovascular:     Rate and  Rhythm: Normal rate and regular rhythm.  Pulmonary:     Effort: Pulmonary effort is normal. No respiratory distress.  Abdominal:     General: There is no distension.     Tenderness: There is no abdominal tenderness. There is no right CVA tenderness, left CVA tenderness or guarding. Negative signs include Murphy's sign and McBurney's sign.  Skin:    General: Skin is warm and dry.  Neurological:     General: No  focal deficit present.     Mental Status: He is alert. Mental status is at baseline.  Psychiatric:        Mood and Affect: Mood normal.        Behavior: Behavior normal.    ED Results / Procedures / Treatments   Labs (all labs ordered are listed, but only abnormal results are displayed) Labs Reviewed  CBC WITH DIFFERENTIAL/PLATELET - Abnormal; Notable for the following components:      Result Value   Monocytes Absolute 1.1 (*)    All other components within normal limits  COMPREHENSIVE METABOLIC PANEL - Abnormal; Notable for the following components:   Glucose, Bld 119 (*)    Total Bilirubin 1.4 (*)    All other components within normal limits  URINALYSIS, ROUTINE W REFLEX MICROSCOPIC - Abnormal; Notable for the following components:   Specific Gravity, Urine >1.030 (*)    All other components within normal limits  RESP PANEL BY RT-PCR (FLU A&B, COVID) ARPGX2  LIPASE, BLOOD  TROPONIN I (HIGH SENSITIVITY)  TROPONIN I (HIGH SENSITIVITY)    EKG EKG Interpretation  Date/Time:  Wednesday March 16 2021 18:27:02 EST Ventricular Rate:  101 PR Interval:  166 QRS Duration: 74 QT Interval:  300 QTC Calculation: 389 R Axis:   69 Text Interpretation: Sinus tachycardia Confirmed by Octaviano Glow [phone]) on 03/17/2021 7:08:31 AM  Radiology DG Chest 2 View  Result Date: 03/16/2021 CLINICAL DATA:  Right-sided chest and abdomen pain. Shortness of breath. Low-grade fever. EXAM: CHEST - 2 VIEW COMPARISON:  Portable chest 08/26/2019 FINDINGS: The heart size and mediastinal  contours are within normal limits. Both lungs are clear apart from trace pleural effusions. The visualized skeletal structures are unremarkable. IMPRESSION: Trace pleural effusions. No other evidence of acute chest disease. The heart is normal in size. The lungs otherwise clear. Electronically Signed   By: Telford Nab M.D.   On: 03/16/2021 19:59   CT ABDOMEN PELVIS W CONTRAST  Result Date: 03/17/2021 CLINICAL DATA:  Fever, right upper quadrant pain for 2 days. EXAM: CT ABDOMEN AND PELVIS WITH CONTRAST TECHNIQUE: Multidetector CT imaging of the abdomen and pelvis was performed using the standard protocol following bolus administration of intravenous contrast. CONTRAST:  149mL OMNIPAQUE IOHEXOL 300 MG/ML  SOLN COMPARISON:  CT examination dated Aug 26, 2018 FINDINGS: Lower chest: Opacity in the posterior aspect of the right lower lobe with adjacent ground-glass attenuation concerning for pneumonia/atelectasis. Left lung bases clear. Hepatobiliary: No focal liver abnormality is seen. Stable hypodensities in the right hepatic lobe, likely cyst or hemangioma. No gallstones, gallbladder wall thickening, or biliary dilatation. Pancreas: Unremarkable. No pancreatic ductal dilatation or surrounding inflammatory changes. Spleen: Normal in size without focal abnormality. Adrenals/Urinary Tract: Adrenal glands are unremarkable. Kidneys are normal, without renal calculi, focal lesion, or hydronephrosis. Bladder is unremarkable. Stomach/Bowel: Stomach is within normal limits. Appendix not visualized, correlate with prior surgical history. No evidence of bowel wall thickening, distention, or inflammatory changes. Vascular/Lymphatic: Aortic atherosclerosis. No enlarged abdominal or pelvic lymph nodes. Reproductive: Prostate is mildly enlarged. Other: No abdominal wall hernia or abnormality. No abdominopelvic ascites. Musculoskeletal: No acute or significant osseous findings. Mild degenerate disc disease of the lumbar spine.  IMPRESSION: 1. Right lower lobe opacity with adjacent ground-glass attenuation concerning for pneumonia/atelectasis. Follow-up examination to resolution is recommended. 2.  Bowel loops are normal in caliber. 3.  No evidence of cholelithiasis or acute cholecystitis. 4.  No evidence of nephrolithiasis or hydronephrosis. Electronically Signed   By: Judye Bos.O.  On: 03/17/2021 09:33    Procedures Procedures   Medications Ordered in ED Medications  iohexol (OMNIPAQUE) 300 MG/ML solution 100 mL (100 mLs Intravenous Contrast Given 03/17/21 0911)  cefTRIAXone (ROCEPHIN) 1 g in sodium chloride 0.9 % 100 mL IVPB (0 g Intravenous Stopped 03/17/21 1031)  doxycycline (VIBRA-TABS) tablet 100 mg (100 mg Oral Given 03/17/21 0947)    ED Course  I have reviewed the triage vital signs and the nursing notes.  Pertinent labs & imaging results that were available during my care of the patient were reviewed by me and considered in my medical decision making (see chart for details).  This patient complains of RUQ/Right RVA tenderness, fevers x 2 days.  This involves an extensive number of treatment options, and is a complaint that carries with it a high risk of complications and morbidity.  The differential diagnosis includes biliary colic or biliary disease versus right lower lobe pneumonia versus ureteral colic versus pyelonephritis versus other intra-abdominal process.  I will lower suspicion for pulmonary embolism.  There is no inciting factor, such as the last time when he had a DVT from prolonged hospitalization for complicated appendectomy.  Pulmonary embolism is also not consistent with symptoms waxing and waning.  He has no tachycardia or hypoxia.    Symptoms do not seem consistent with ACS or aortic dissection  Abdominal exam benign - no guarding or rigidity. No GI symptoms.  Doubt mesenteric ischemia or acute SBO.  I ordered, reviewed, and interpreted labs.  No life-threatening abnormalities were  noted on these tests.  Tbili mildly elevated at 1.4.  WBC wnl.  Hgb wnl.  UA showing no sign of infection I ordered imaging studies which included x-ray of the chest and CT scan of the abdomen with contrast I independently visualized and interpreted imaging which showed small possible pleural effusion on the x-ray of the chest, no evident pneumonia or pneumothorax, CT scan showing suspected RLL PNA.  No acute intraabdominal process to explain his symptoms. Patient is EKG per my interpretation shows a normal sinus rhythm without acute ischemic findings.  After the interventions stated above, I reevaluated the patient and found he remained stable on room air, no evidence of worsening hypoxia or sepsis.  Clinical Course as of 03/17/21 1144  Thu Mar 17, 2021  0952 Patient and son are updated regarding the likelihood of pneumonia.  I suspect clinically this fits with his symptoms.  We will give him some Rocephin and start him on doxycycline.  Okay for outpatient follow-up. [MT]    Clinical Course User Index [MT] Philena Obey, Carola Rhine, MD    Final Clinical Impression(s) / ED Diagnoses Final diagnoses:  Community acquired pneumonia of right lower lobe of lung    Rx / DC Orders ED Discharge Orders          Ordered    doxycycline (VIBRAMYCIN) 100 MG capsule  2 times daily        03/17/21 0954             Wyvonnia Dusky, MD 03/17/21 1145

## 2021-03-18 ENCOUNTER — Telehealth: Payer: Self-pay | Admitting: Family Medicine

## 2021-03-18 NOTE — Telephone Encounter (Signed)
Noted. Thanks.

## 2021-03-18 NOTE — Telephone Encounter (Signed)
Patient would like to keep office follow up for next week and discuss repeat xray at that time.

## 2021-03-18 NOTE — Telephone Encounter (Signed)
Please get update on patient after ER eval for likely PNA.  Would be reasonable to recheck CXR in about 1 month to make sure this resolved.  We can do that at [facility] if needed or at outpatient imaging site. Let me know which way he wants to go. Thanks.

## 2021-03-18 NOTE — Telephone Encounter (Signed)
Patient called back after call and requested to change appointment to a month. Will change follow up for patient.

## 2021-03-18 NOTE — Telephone Encounter (Signed)
Mult options here, course depending on how he feels.   If worse, clearly needs recheck ASAP.  If not feeling better soon, then needs recheck here.  It would make sense to get recheck CXR done in about 1 month if he is feeling better in the meantime.   May be reasonable for follow up here next week, then the CXR later on.  Thanks.

## 2021-03-18 NOTE — Telephone Encounter (Signed)
Patient is feeling better just weak. He is fine with xray and would like at [facility] do you want to as follow up or just xray? Informed patient we will verify how needs to be changed and call back for scheduling.

## 2021-03-24 ENCOUNTER — Ambulatory Visit: Payer: Medicare HMO | Admitting: Family Medicine

## 2021-03-29 ENCOUNTER — Ambulatory Visit: Payer: Medicare HMO | Admitting: Family Medicine

## 2021-04-25 ENCOUNTER — Ambulatory Visit (INDEPENDENT_AMBULATORY_CARE_PROVIDER_SITE_OTHER): Payer: Medicare HMO | Admitting: Family Medicine

## 2021-04-25 ENCOUNTER — Ambulatory Visit (INDEPENDENT_AMBULATORY_CARE_PROVIDER_SITE_OTHER)
Admission: RE | Admit: 2021-04-25 | Discharge: 2021-04-25 | Disposition: A | Discharge status: Home / Self Care | Payer: Medicare HMO | Source: Ambulatory Visit | Attending: Family Medicine | Admitting: Family Medicine

## 2021-04-25 ENCOUNTER — Other Ambulatory Visit: Payer: Self-pay

## 2021-04-25 ENCOUNTER — Encounter: Payer: Self-pay | Admitting: Family Medicine

## 2021-04-25 ENCOUNTER — Ambulatory Visit: Payer: Medicare HMO | Admitting: Family Medicine

## 2021-04-25 VITALS — BP 136/78 | HR 77 | Temp 98.4°F | Ht 71.0 in | Wt 188.0 lb

## 2021-04-25 DIAGNOSIS — R42 Dizziness and giddiness: Secondary | ICD-10-CM

## 2021-04-25 DIAGNOSIS — R600 Localized edema: Secondary | ICD-10-CM

## 2021-04-25 DIAGNOSIS — R9389 Abnormal findings on diagnostic imaging of other specified body structures: Secondary | ICD-10-CM | POA: Diagnosis not present

## 2021-04-25 NOTE — Patient Instructions (Addendum)
We'll call about getting the ultrasound set up.  Take care.  Glad to see you.  Go to the lab on the way out.   If you have mychart we'll likely use that to update you.     Try the bed side exercise if you have more vertigo symptoms.

## 2021-04-25 NOTE — Progress Notes (Signed)
This visit occurred during the SARS-CoV-2 public health emergency.  Safety protocols were in place, including screening questions prior to the visit, additional usage of staff PPE, and extensive cleaning of exam room while observing appropriate contact time as indicated for disinfecting solutions.  Follow up.  D/w pt about f/u CXR, as this is usually done after the patient has a chance to clear her previous abnormal chest x-ray.  No FCNAVD.  No cough.  Feeling well o/w.    Prev vertigo sx resolved.  He clearly had positional changes that would induce and the resolve the sx.  It sounds like he had BPV, d/w pt. BPV pathophysiology discussed with patient.  He has some occ discomfort in the L calf, still with some occ L>R swelling that improves with the compression stocking.  He had prev anticoagulation for prev DVT.  D/w pt. he is off anticoagulation currently.  Meds, vitals, and allergies reviewed.   ROS: Per HPI unless specifically indicated in ROS section   GEN: nad, alert and oriented HEENT: ncat NECK: supple w/o LA CV: rrr.  PULM: ctab, no inc wob ABD: soft, +bs EXT: no edema SKIN: no acute rash Edema controlled with compression stockings on the BLE  32 minutes were devoted to patient care in this encounter (this includes time spent reviewing the patient's file/history, interviewing and examining the patient, counseling/reviewing plan with patient).

## 2021-04-26 ENCOUNTER — Ambulatory Visit
Admission: RE | Admit: 2021-04-26 | Discharge: 2021-04-26 | Disposition: A | Discharge status: Home / Self Care | Payer: Medicare HMO | Source: Ambulatory Visit | Attending: Family Medicine | Admitting: Family Medicine

## 2021-04-26 ENCOUNTER — Telehealth: Payer: Self-pay

## 2021-04-26 ENCOUNTER — Other Ambulatory Visit: Payer: Self-pay | Admitting: Family Medicine

## 2021-04-26 DIAGNOSIS — R600 Localized edema: Secondary | ICD-10-CM | POA: Diagnosis not present

## 2021-04-26 DIAGNOSIS — I82409 Acute embolism and thrombosis of unspecified deep veins of unspecified lower extremity: Secondary | ICD-10-CM

## 2021-04-26 DIAGNOSIS — I82432 Acute embolism and thrombosis of left popliteal vein: Secondary | ICD-10-CM | POA: Diagnosis not present

## 2021-04-26 MED ORDER — RIVAROXABAN (XARELTO) VTE STARTER PACK (15 & 20 MG): ORAL_TABLET | ORAL | 0 refills | Status: DC

## 2021-04-26 MED ORDER — RIVAROXABAN 20 MG PO TABS: 20.0000 mg | ORAL_TABLET | Freq: Every day | ORAL | 12 refills | Status: DC

## 2021-04-26 NOTE — Progress Notes (Signed)
LLE venous duplex competed.  Discussed urgent findings of final report with Joellen at Dr. Josefine Class office.  Patient instructed to pick up anticoagulant at the pharmacy today.

## 2021-04-26 NOTE — Telephone Encounter (Signed)
Call received on patient from [place] at [facility]. Report reviewed by Dr. Damita Dunnings. Per his recommendation I have informed patient of results and instructed per Dr. Damita Dunnings he will send in blood thinner for him to start back on. Patient did not have any further questions at this time. As requested by Dr. Damita Dunnings report placed on his desk. Good call back number if you need to reach patient [phone]

## 2021-04-27 ENCOUNTER — Telehealth: Payer: Self-pay | Admitting: Hematology and Oncology

## 2021-04-27 DIAGNOSIS — R42 Dizziness and giddiness: Secondary | ICD-10-CM | POA: Insufficient documentation

## 2021-04-27 DIAGNOSIS — R9389 Abnormal findings on diagnostic imaging of other specified body structures: Secondary | ICD-10-CM | POA: Insufficient documentation

## 2021-04-27 DIAGNOSIS — R600 Localized edema: Secondary | ICD-10-CM | POA: Insufficient documentation

## 2021-04-27 NOTE — Assessment & Plan Note (Signed)
Not noted now but it sounds like he had previously inducible symptoms that were consistent with BPV.  Pathophysiology discussed with patient.  Home bedside exercises discussed with patient in case he has return of symptoms.  He can update me as needed.

## 2021-04-27 NOTE — Assessment & Plan Note (Signed)
His concern was the previous DVT and making sure he did not have anything else going on noted in the meantime, i.e. another DVT.  We sent him for ultrasound, see following result note.  I got the phone call that he had DVT noted, we talked about options via phone and he is aware of his results.  He will start anticoagulation with Xarelto.  He is aware to use the starter pack and then transition to the regular dosing of 20 mg daily.  Refer to hematology.  Routine cautions given to patient.  He had a dental clinic appointment pending and I asked him to check with the dentist, given his situation with a clot and anticoagulation.  He agrees.  He will update me as needed.

## 2021-04-27 NOTE — Telephone Encounter (Signed)
Scheduled appt per 1/10 referral. Pt is aware of appt dat eand time. Pt is aware to arrive 15 mins prior to appt time.  °

## 2021-04-27 NOTE — Assessment & Plan Note (Signed)
Lungs are clear.  No chest pain.  See follow-up x-ray report.

## 2021-04-27 NOTE — Telephone Encounter (Signed)
See result note.  Thanks!

## 2021-05-02 DIAGNOSIS — H43392 Other vitreous opacities, left eye: Secondary | ICD-10-CM | POA: Diagnosis not present

## 2021-05-02 DIAGNOSIS — H52221 Regular astigmatism, right eye: Secondary | ICD-10-CM | POA: Diagnosis not present

## 2021-05-02 DIAGNOSIS — H524 Presbyopia: Secondary | ICD-10-CM | POA: Diagnosis not present

## 2021-05-02 DIAGNOSIS — H5203 Hypermetropia, bilateral: Secondary | ICD-10-CM | POA: Diagnosis not present

## 2021-05-09 ENCOUNTER — Encounter: Payer: Self-pay | Admitting: Hematology and Oncology

## 2021-05-09 ENCOUNTER — Encounter: Payer: Self-pay | Admitting: Family Medicine

## 2021-05-09 ENCOUNTER — Inpatient Hospital Stay: Payer: Medicare HMO | Attending: Hematology and Oncology | Admitting: Hematology and Oncology

## 2021-05-09 ENCOUNTER — Other Ambulatory Visit: Payer: Self-pay

## 2021-05-09 DIAGNOSIS — I82432 Acute embolism and thrombosis of left popliteal vein: Secondary | ICD-10-CM | POA: Diagnosis not present

## 2021-05-09 DIAGNOSIS — Z808 Family history of malignant neoplasm of other organs or systems: Secondary | ICD-10-CM | POA: Diagnosis not present

## 2021-05-09 DIAGNOSIS — R600 Localized edema: Secondary | ICD-10-CM | POA: Diagnosis not present

## 2021-05-09 DIAGNOSIS — Z800 Family history of malignant neoplasm of digestive organs: Secondary | ICD-10-CM | POA: Insufficient documentation

## 2021-05-09 DIAGNOSIS — Z806 Family history of leukemia: Secondary | ICD-10-CM | POA: Insufficient documentation

## 2021-05-09 NOTE — Assessment & Plan Note (Signed)
I reviewed both ultrasound venous Doppler results extensively with the patient The first incidence of DVT is definitely provoked by his surgery, ileus, hospitalization and immobilization He has completed anticoagulation therapy appropriately for 7 months His recent venous Doppler ultrasound result is scrutinized by myself; I am not entirely convinced this represent new acute DVT.  Chronic nonocclusive changes can be seen after extensive DVT He felt better since resumption of anticoagulation therapy I recommend the patient to continue Xarelto for another 6 months I plan to see him middle of July with repeat blood work and ultrasound bilateral lower extremity to document findings of his new baseline At that point in time, we will discuss the pros and cons of discontinuation of Xarelto versus continuing treatment indefinitely We reviewed some of the common risk factors for recurrent DVT such as prolonged immobility, dehydration, long distance travel, etc. The patient has some chronic lower extremity swelling that could be indicative of postphlebitic changes; I recommend him to continue to use elastic compression hose during daytime for now There is no benefit of checking for thrombophilic disorder/hyper coag study as it would not change management

## 2021-05-09 NOTE — Progress Notes (Signed)
Joel Lopez CONSULT NOTE  Patient Care Team: Tonia Ghent, MD as PCP - General (Family Medicine) Stefanie Libel, MD (Family Medicine) Barbaraann Cao, OD (Optometry)   ASSESSMENT & PLAN:  Acute deep vein thrombosis (DVT) of popliteal vein of left lower extremity ([place]) I reviewed both ultrasound venous Doppler results extensively with the patient The first incidence of DVT is definitely provoked by his surgery, ileus, hospitalization and immobilization He has completed anticoagulation therapy appropriately for 7 months His recent venous Doppler ultrasound result is scrutinized by myself; I am not entirely convinced this represent new acute DVT.  Chronic nonocclusive changes can be seen after extensive DVT He felt better since resumption of anticoagulation therapy I recommend the patient to continue Xarelto for another 6 months I plan to see him middle of July with repeat blood work and ultrasound bilateral lower extremity to document findings of his new baseline At that point in time, we will discuss the pros and cons of discontinuation of Xarelto versus continuing treatment indefinitely We reviewed some of the common risk factors for recurrent DVT such as prolonged immobility, dehydration, long distance travel, etc. The patient has some chronic lower extremity swelling that could be indicative of postphlebitic changes; I recommend him to continue to use elastic compression hose during daytime for now There is no benefit of checking for thrombophilic disorder/hyper coag study as it would not change management  Orders Placed This Encounter  Procedures   CMP ([place] only)    Standing Status:   Future    Standing Expiration Date:   2/83/6629   Basic Metabolic Panel - [place] Only    Standing Status:   Future    Standing Expiration Date:   05/09/2022   D-dimer, quantitative    Standing Status:   Future    Standing Expiration Date:   05/09/2022    All  questions were answered. The patient knows to call the clinic with any problems, questions or concerns. The total time spent in the appointment was 60 minutes encounter with patients including review of chart and various tests results, discussions about plan of care and coordination of care plan  Joel Lark, MD 1/23/20232:05 PM  CHIEF COMPLAINTS/PURPOSE OF CONSULTATION:  Possible recurrent DVT  HISTORY OF PRESENTING ILLNESS:  Joel Lopez 71 y.o. male is here because of recent findings of recurrent DVT  The patient is retired and healthy.  He plays tennis on a regular basis. He was recently diagnosed with possible recurrent left lower extremity DVT and was placed on anticoagulation therapy In April 2021, the patient was hospitalized for acute appendicitis and peritonitis The patient was hospitalized for over 2 weeks, complicated by lower extremity DVT On Aug 26, 2019, venous Doppler ultrasound of both lower extremity revealed  RIGHT:  - Findings consistent with acute deep vein thrombosis involving the right posterior tibial veins, and right peroneal veins.     LEFT:  - Findings consistent with acute deep vein thrombosis involving the left peroneal veins.  He was appropriately anticoagulated and completed 7 months worth of anticoagulation treatment with Xarelto He has intermittent lower extremity edema of both legs especially in the evening Most recently, he complained of left lower extremity tightness and discomfort  He denies warmth, tenderness & erythema of the leg.  He denies recent chest pain on exertion, shortness of breath on minimal exertion, pre-syncopal episodes, hemoptysis, or palpitation. He denies recent history of trauma, long distance travel, dehydration, recent surgery, smoking or prolonged  immobilization. He had no prior history or diagnosis of cancer. His age appropriate screening programs are up-to-date.  On April 26, 2021, he had repeat left lower extremity  ultrasound which showed acute appearing nonocclusive left popliteal DVT extending into the tibial and gastrocnemius calf veins. He was placed on Xarelto again.  He had prior surgeries before and never had perioperative thromboembolic events until his surgery in 2021. The patient had not used testosterone replacement therapy   There is no family history of blood clots or miscarriages.  MEDICAL HISTORY:  Past Medical History:  Diagnosis Date   Anemia of other chronic disease    normal blood blood counts since 2007   Arthritis    GERD (gastroesophageal reflux disease)    Hamartomatous polyp of large intestine (HCC)    Hemorrhoids    History of DVT in adulthood    Ileus ([place])    Internal hemorrhoids    Labyrinthitis    Obstructive sleep apnea    study '07 - AHI 17, lowest O2 sat 86%. not using CPAP, couldn't tolerate CPAP   Plantar fasciitis    Right rotator cuff tear 2013   90% normal function.   Rupture long head biceps tendon 2014   right. 90% normal function    SURGICAL HISTORY: Past Surgical History:  Procedure Laterality Date   DENTAL SURGERY     implant   INGUINAL HERNIA REPAIR Left 04/26/2018   Procedure: LEFT INGUINAL HERNIA REPAIR WITH MESH;  Surgeon: Jovita Kussmaul, MD;  Location: [facility];  Service: General;  Laterality: Left;   INSERTION OF MESH Left 04/26/2018   Procedure: INSERTION OF MESH;  Surgeon: Jovita Kussmaul, MD;  Location: [place] [facility];  Service: General;  Laterality: Left;   LAPAROSCOPIC APPENDECTOMY N/A 08/12/2019   Procedure: APPENDECTOMY LAPAROSCOPIC;  Surgeon: Donnie Mesa, MD;  Location: [facility] [facility];  Service: General;  Laterality: N/A;   ORIF ULNAR FRACTURE Left    had screws and plate; had screws and plate removed [id number]    SOCIAL HISTORY: Social History   Socioeconomic History   Marital status: Divorced    Spouse name: Not on file   Number of children: 3   Years of education: 12   Highest education level: Not on file  Occupational History    Occupation: METER TECH    Employer: [employer].  Tobacco Use   Smoking status: Never   Smokeless tobacco: Never  Vaping Use   Vaping Use: Never used  Substance and Sexual Activity   Alcohol use: Yes    Alcohol/week: 0.0 standard drinks    Comment: social   Drug use: No   Sexual activity: Yes    Partners: Female  Other Topics Concern   Not on file  Social History Narrative   [employer].    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    Lives alone.    Pt has children: 2 sons - '72, '80; 1 dtr -'76; 9 grandchildren, 1 great grandchild   Current relationship status off/on. No complaint of sexual dysfunction. Regular Exercise- yes, tennis 3 x a week. Resistance training. Discussed ACP and referred to [email or web address] for his consideration (Dec '14)    Enjoys playing tennis.    Social Determinants of Health   Financial Resource Strain: Not on file  Food Insecurity: Not on file  Transportation Needs: Not on file  Physical Activity: Not on file  Stress: Not on file  Social Connections: Not on file  Intimate  Partner Violence: Not on file    FAMILY HISTORY: Family History  Problem Relation Age of Onset   Colon cancer Maternal Aunt    Esophageal cancer Maternal Uncle    Leukemia Mother    Diabetes Neg Hx    Heart disease Neg Hx    Hyperlipidemia Neg Hx    Stroke Neg Hx    Prostate cancer Neg Hx    Rectal cancer Neg Hx    Stomach cancer Neg Hx     ALLERGIES:  is allergic to shellfish allergy.  MEDICATIONS:  Current Outpatient Medications  Medication Sig Dispense Refill   Multiple Vitamin (MULTIVITAMIN) tablet Take 1 tablet by mouth daily.     rivaroxaban (XARELTO) 20 MG TABS tablet Take 1 tablet (20 mg total) by mouth daily with supper. Use this prescription after completing starter pack 30 tablet 12   RIVAROXABAN (XARELTO) VTE STARTER PACK (15 & 20 MG) Follow package directions: Take one 15mg  tablet by mouth twice  a day. On day 22, switch to one 20mg  tablet once a day. Take with food. 51 each 0   No current facility-administered medications for this visit.    REVIEW OF SYSTEMS:   Constitutional: Denies fevers, chills or abnormal night sweats Eyes: Denies blurriness of vision, double vision or watery eyes Ears, nose, mouth, throat, and face: Denies mucositis or sore throat Respiratory: Denies cough, dyspnea or wheezes Cardiovascular: Denies palpitation, chest discomfort  Gastrointestinal:  Denies nausea, heartburn or change in bowel habits Skin: Denies abnormal skin rashes Lymphatics: Denies new lymphadenopathy or easy bruising Neurological:Denies numbness, tingling or new weaknesses Behavioral/Psych: Mood is stable, no new changes  All other systems were reviewed with the patient and are negative.  PHYSICAL EXAMINATION: ECOG PERFORMANCE STATUS: 0 - Asymptomatic  Vitals:   05/09/21 1306  BP: (!) 144/85  Pulse: 81  Resp: 18  Temp: 97.9 F (36.6 C)  SpO2: 99%   Filed Weights   05/09/21 1306  Weight: 193 lb 3.2 oz (87.6 kg)    GENERAL:alert, no distress and comfortable SKIN: skin color, texture, turgor are normal, no rashes or significant lesions EYES: normal, conjunctiva are pink and non-injected, sclera clear OROPHARYNX:no exudate, no erythema and lips, buccal mucosa, and tongue normal  NECK: supple, thyroid normal size, non-tender, without nodularity LYMPH:  no palpable lymphadenopathy in the cervical, axillary or inguinal LUNGS: clear to auscultation and percussion with normal breathing effort HEART: regular rate & rhythm and no murmurs and no lower extremity edema ABDOMEN:abdomen soft, non-tender and normal bowel sounds Musculoskeletal:no cyanosis of digits and no clubbing  PSYCH: alert & oriented x 3 with fluent speech NEURO: no focal motor/sensory deficits  LABORATORY DATA:  I have reviewed the data as listed Lab Results  Component Value Date   WBC 7.3 03/16/2021   HGB  13.6 03/16/2021   HCT 42.7 03/16/2021   MCV 87.1 03/16/2021   PLT 267 03/16/2021    RADIOGRAPHIC STUDIES: I have personally reviewed the radiological images as listed and agreed with the findings in the report. DG Chest 2 View  Result Date: 04/25/2021 CLINICAL DATA:  71 year old male with abnormal chest x-ray EXAM: CHEST - 2 VIEW COMPARISON:  03/16/2021 FINDINGS: Cardiomediastinal silhouette unchanged in size and contour. No evidence of central vascular congestion. No interlobular septal thickening. No pneumothorax. Interval resolution of prior small pleural effusions. Coarsened interstitial markings, with no confluent airspace disease. No acute displaced fracture. Degenerative changes of the spine. IMPRESSION: Interval resolution of prior pleural effusions with no evidence of  acute cardiopulmonary disease Electronically Signed   By: Corrie Mckusick D.O.   On: 04/25/2021 14:43   US Venous Img Lower Unilateral Left (DVT)  Result Date: 04/26/2021 CLINICAL DATA:  Lower extremity edema and pain EXAM: LEFT LOWER EXTREMITY VENOUS DOPPLER ULTRASOUND TECHNIQUE: Gray-scale sonography with graded compression, as well as color Doppler and duplex ultrasound were performed to evaluate the lower extremity deep venous systems from the level of the common femoral vein and including the common femoral, femoral, profunda femoral, popliteal and calf veins including the posterior tibial, peroneal and gastrocnemius veins when visible. The superficial great saphenous vein was also interrogated. Spectral Doppler was utilized to evaluate flow at rest and with distal augmentation maneuvers in the common femoral, femoral and popliteal veins. COMPARISON:  None. FINDINGS: Contralateral Common Femoral Vein: Respiratory phasicity is normal and symmetric with the symptomatic side. No evidence of thrombus. Normal compressibility. Common Femoral Vein: No evidence of thrombus. Normal compressibility, respiratory phasicity and response to  augmentation. Saphenofemoral Junction: No evidence of thrombus. Normal compressibility and flow on color Doppler imaging. Profunda Femoral Vein: No evidence of thrombus. Normal compressibility and flow on color Doppler imaging. Femoral Vein: No evidence of thrombus. Normal compressibility, respiratory phasicity and response to augmentation. Popliteal Vein: Mixed echogenicity intraluminal thrombus appearing nonocclusive. Vessel partially compressible. Phasic flow maintained. Augmentation not performed. Calf Veins: Thrombus does appear to extend into the posterior tibial and gastrocnemius veins. IMPRESSION: Acute appearing nonocclusive left popliteal DVT extending into the tibial and gastrocnemius calf veins. These results will be called to the ordering clinician or representative by the Radiologist Assistant, and communication documented in the PACS or [employer]. Electronically Signed   By: Jerilynn Mages.  Shick M.D.   On: 04/26/2021 16:22

## 2021-05-13 DIAGNOSIS — Z0100 Encounter for examination of eyes and vision without abnormal findings: Secondary | ICD-10-CM | POA: Diagnosis not present

## 2021-08-23 DIAGNOSIS — Z809 Family history of malignant neoplasm, unspecified: Secondary | ICD-10-CM | POA: Diagnosis not present

## 2021-08-23 DIAGNOSIS — Z86718 Personal history of other venous thrombosis and embolism: Secondary | ICD-10-CM | POA: Diagnosis not present

## 2021-08-23 DIAGNOSIS — R030 Elevated blood-pressure reading, without diagnosis of hypertension: Secondary | ICD-10-CM | POA: Diagnosis not present

## 2021-08-23 DIAGNOSIS — Z7901 Long term (current) use of anticoagulants: Secondary | ICD-10-CM | POA: Diagnosis not present

## 2021-08-23 DIAGNOSIS — K219 Gastro-esophageal reflux disease without esophagitis: Secondary | ICD-10-CM | POA: Diagnosis not present

## 2021-09-02 ENCOUNTER — Ambulatory Visit (INDEPENDENT_AMBULATORY_CARE_PROVIDER_SITE_OTHER): Payer: Medicare HMO

## 2021-09-02 VITALS — Ht 71.0 in | Wt 191.0 lb

## 2021-09-02 DIAGNOSIS — Z0000 Encounter for general adult medical examination without abnormal findings: Secondary | ICD-10-CM

## 2021-09-02 NOTE — Patient Instructions (Signed)
[name] , Thank you for taking time to come for your Medicare Wellness Visit. I appreciate your ongoing commitment to your health goals. Please review the following plan we discussed and let me know if I can assist you in the future.   Screening recommendations/referrals: Colonoscopy: completed 01/24/2018, due 01/25/2028 Recommended yearly ophthalmology/optometry visit for glaucoma screening and checkup Recommended yearly dental visit for hygiene and checkup  Vaccinations: Influenza vaccine: due 11/15/2021 Pneumococcal vaccine: completed 01/29/2020 Tdap vaccine: completed 11/12/2018, due 11/11/2028 Shingles vaccine: completed    Covid-19:  01/28/2021, 01/22/2020, 06/23/2019, 05/30/2019  Advanced directives: Please bring a copy of your POA (Power of Attorney) and/or Living Will to your next appointment.   Conditions/risks identified: none  Next appointment: Follow up in one year for your annual wellness visit.   Preventive Care 55 Years and Older, Male Preventive care refers to lifestyle choices and visits with your health care provider that can promote health and wellness. What does preventive care include? A yearly physical exam. This is also called an annual well check. Dental exams once or twice a year. Routine eye exams. Ask your health care provider how often you should have your eyes checked. Personal lifestyle choices, including: Daily care of your teeth and gums. Regular physical activity. Eating a healthy diet. Avoiding tobacco and drug use. Limiting alcohol use. Practicing safe sex. Taking low doses of aspirin every day. Taking vitamin and mineral supplements as recommended by your health care provider. What happens during an annual well check? The services and screenings done by your health care provider during your annual well check will depend on your age, overall health, lifestyle risk factors, and family history of disease. Counseling  Your health care provider may  ask you questions about your: Alcohol use. Tobacco use. Drug use. Emotional well-being. Home and relationship well-being. Sexual activity. Eating habits. History of falls. Memory and ability to understand (cognition). Work and work Statistician. Screening  You may have the following tests or measurements: Height, weight, and BMI. Blood pressure. Lipid and cholesterol levels. These may be checked every 5 years, or more frequently if you are over 69 years old. Skin check. Lung cancer screening. You may have this screening every year starting at age 49 if you have a 30-pack-year history of smoking and currently smoke or have quit within the past 15 years. Fecal occult blood test (FOBT) of the stool. You may have this test every year starting at age 71. Flexible sigmoidoscopy or colonoscopy. You may have a sigmoidoscopy every 5 years or a colonoscopy every 10 years starting at age 78. Prostate cancer screening. Recommendations will vary depending on your family history and other risks. Hepatitis C blood test. Hepatitis B blood test. Sexually transmitted disease (STD) testing. Diabetes screening. This is done by checking your blood sugar (glucose) after you have not eaten for a while (fasting). You may have this done every 1-3 years. Abdominal aortic aneurysm (AAA) screening. You may need this if you are a current or former smoker. Osteoporosis. You may be screened starting at age 68 if you are at high risk. Talk with your health care provider about your test results, treatment options, and if necessary, the need for more tests. Vaccines  Your health care provider may recommend certain vaccines, such as: Influenza vaccine. This is recommended every year. Tetanus, diphtheria, and acellular pertussis (Tdap, Td) vaccine. You may need a Td booster every 10 years. Zoster vaccine. You may need this after age 80. Pneumococcal 13-valent conjugate (PCV13) vaccine.  One dose is recommended after age  54. Pneumococcal polysaccharide (PPSV23) vaccine. One dose is recommended after age 80. Talk to your health care provider about which screenings and vaccines you need and how often you need them. This information is not intended to replace advice given to you by your health care provider. Make sure you discuss any questions you have with your health care provider. Document Released: 04/30/2015 Document Revised: 12/22/2015 Document Reviewed: 02/02/2015 Elsevier Interactive Patient Education  2017 [place] Prevention in the Home Falls can cause injuries. They can happen to people of all ages. There are many things you can do to make your home safe and to help prevent falls. What can I do on the outside of my home? Regularly fix the edges of walkways and driveways and fix any cracks. Remove anything that might make you trip as you walk through a door, such as a raised step or threshold. Trim any bushes or trees on the path to your home. Use bright outdoor lighting. Clear any walking paths of anything that might make someone trip, such as rocks or tools. Regularly check to see if handrails are loose or broken. Make sure that both sides of any steps have handrails. Any raised decks and porches should have guardrails on the edges. Have any leaves, snow, or ice cleared regularly. Use sand or salt on walking paths during winter. Clean up any spills in your garage right away. This includes oil or grease spills. What can I do in the bathroom? Use night lights. Install grab bars by the toilet and in the tub and shower. Do not use towel bars as grab bars. Use non-skid mats or decals in the tub or shower. If you need to sit down in the shower, use a plastic, non-slip stool. Keep the floor dry. Clean up any water that spills on the floor as soon as it happens. Remove soap buildup in the tub or shower regularly. Attach bath mats securely with double-sided non-slip rug tape. Do not have throw  rugs and other things on the floor that can make you trip. What can I do in the bedroom? Use night lights. Make sure that you have a light by your bed that is easy to reach. Do not use any sheets or blankets that are too big for your bed. They should not hang down onto the floor. Have a firm chair that has side arms. You can use this for support while you get dressed. Do not have throw rugs and other things on the floor that can make you trip. What can I do in the kitchen? Clean up any spills right away. Avoid walking on wet floors. Keep items that you use a lot in easy-to-reach places. If you need to reach something above you, use a strong step stool that has a grab bar. Keep electrical cords out of the way. Do not use floor polish or wax that makes floors slippery. If you must use wax, use non-skid floor wax. Do not have throw rugs and other things on the floor that can make you trip. What can I do with my stairs? Do not leave any items on the stairs. Make sure that there are handrails on both sides of the stairs and use them. Fix handrails that are broken or loose. Make sure that handrails are as long as the stairways. Check any carpeting to make sure that it is firmly attached to the stairs. Fix any carpet that is loose or  worn. Avoid having throw rugs at the top or bottom of the stairs. If you do have throw rugs, attach them to the floor with carpet tape. Make sure that you have a light switch at the top of the stairs and the bottom of the stairs. If you do not have them, ask someone to add them for you. What else can I do to help prevent falls? Wear shoes that: Do not have high heels. Have rubber bottoms. Are comfortable and fit you well. Are closed at the toe. Do not wear sandals. If you use a stepladder: Make sure that it is fully opened. Do not climb a closed stepladder. Make sure that both sides of the stepladder are locked into place. Ask someone to hold it for you, if  possible. Clearly mark and make sure that you can see: Any grab bars or handrails. First and last steps. Where the edge of each step is. Use tools that help you move around (mobility aids) if they are needed. These include: Canes. Walkers. Scooters. Crutches. Turn on the lights when you go into a dark area. Replace any light bulbs as soon as they burn out. Set up your furniture so you have a clear path. Avoid moving your furniture around. If any of your floors are uneven, fix them. If there are any pets around you, be aware of where they are. Review your medicines with your doctor. Some medicines can make you feel dizzy. This can increase your chance of falling. Ask your doctor what other things that you can do to help prevent falls. This information is not intended to replace advice given to you by your health care provider. Make sure you discuss any questions you have with your health care provider. Document Released: 01/28/2009 Document Revised: 09/09/2015 Document Reviewed: 05/08/2014 Elsevier Interactive Patient Education  2017 [employer].

## 2021-09-02 NOTE — Progress Notes (Signed)
I connected with Joel Lopez today by telephone and verified that I am speaking with the correct person using two identifiers. Location patient: home Location provider: work Persons participating in the virtual visit: [employer], Glenna Durand LPN.   I discussed the limitations, risks, security and privacy concerns of performing an evaluation and management service by telephone and the availability of in person appointments. I also discussed with the patient that there may be a patient responsible charge related to this service. The patient expressed understanding and verbally consented to this telephonic visit.    Interactive audio and video telecommunications were attempted between this provider and patient, however failed, due to patient having technical difficulties OR patient did not have access to video capability.  We continued and completed visit with audio only.     Vital signs may be patient reported or missing.  Subjective:   Joel Lopez is a 71 y.o. male who presents for Medicare Annual/Subsequent preventive examination.  Review of Systems     Cardiac Risk Factors include: advanced age (>43mn, >>51women);male gender     Objective:    Today's Vitals   09/02/21 1556  Weight: 191 lb (86.6 kg)  Height: '5\' 11"'$  (1.803 m)   Body mass index is 26.64 kg/m.     09/02/2021    4:00 PM 08/12/2019    9:50 AM 04/26/2018    9:31 AM 04/23/2018    8:35 AM  Advanced Directives  Does Patient Have a Medical Advance Directive? Yes No Yes Yes  Type of AParamedicof [place] Vining[place] will  [place]ast [place] will [place] will  Does patient want to make changes to medical advance directive?    No - Patient declined  Copy of HSmythin Chart? No - copy requested  No - copy requested No - copy requested  Would patient like information on creating a medical advance directive?  No - Patient  declined      Current Medications (verified) Outpatient Encounter Medications as of 09/02/2021  Medication Sig   dexamethasone 0.5 MG/5ML elixir Take by mouth.   Multiple Vitamin (MULTIVITAMIN) tablet Take 1 tablet by mouth daily.   rivaroxaban (XARELTO) 20 MG TABS tablet Take 1 tablet (20 mg total) by mouth daily with supper. Use this prescription after completing starter pack   RIVAROXABAN (XARELTO) VTE STARTER PACK (15 & 20 MG) Follow package directions: Take one '15mg'$  tablet by mouth twice a day. On day 22, switch to one '20mg'$  tablet once a day. Take with food.   No facility-administered encounter medications on file as of 09/02/2021.    Allergies (verified) Shellfish allergy   History: Past Medical History:  Diagnosis Date   Anemia of other chronic disease    normal blood blood counts since 2007   Arthritis    GERD (gastroesophageal reflux disease)    Hamartomatous polyp of large intestine (HCC)    Hemorrhoids    History of DVT in adulthood    [place] [place]    Internal hemorrhoids    Labyrinthitis    Obstructive sleep apnea    study '07 - AHI 17, lowest O2 sat 86%. not using CPAP, couldn't tolerate CPAP   Plantar fasciitis    Right rotator cuff tear 2013   90% normal function.   Rupture long head biceps tendon 2014   right. 90% normal function   Past Surgical History:  Procedure Laterality Date   DENTAL SURGERY  implant   INGUINAL HERNIA REPAIR Left 04/26/2018   Procedure: LEFT INGUINAL HERNIA REPAIR WITH MESH;  Surgeon: Jovita Kussmaul, MD;  Location: [facility];  Service: General;  Laterality: Left;   INSERTION OF MESH Left 04/26/2018   Procedure: INSERTION OF MESH;  Surgeon: Jovita Kussmaul, MD;  Location: [place];  Service: General;  Laterality: Left;   LAPAROSCOPIC APPENDECTOMY N/A 08/12/2019   Procedure: APPENDECTOMY LAPAROSCOPIC;  Surgeon: Donnie Mesa, MD;  Location: [place];  Service: General;  Laterality: N/A;   ORIF ULNAR FRACTURE Left    had screws and plate; had  screws and plate removed 2585   Family History  Problem Relation Age of Onset   Colon cancer Maternal Aunt    Esophageal cancer Maternal Uncle    Leukemia Mother    Diabetes Neg Hx    Heart disease Neg Hx    Hyperlipidemia Neg Hx    Stroke Neg Hx    Prostate cancer Neg Hx    Rectal cancer Neg Hx    Stomach cancer Neg Hx    Social History   Socioeconomic History   Marital status: Divorced    Spouse name: Not on file   Number of children: 3   Years of education: 12   Highest education level: Not on file  Occupational History   Occupation: METER TECH    Employer: [employer]  Tobacco Use   Smoking status: Never   Smokeless tobacco: Never  Vaping Use   Vaping Use: Never used  Substance and Sexual Activity   Alcohol use: Yes    Alcohol/week: 0.0 standard drinks    Comment: social   Drug use: No   Sexual activity: Yes    Partners: Female  Other Topics Concern   Not on file  Social History Narrative   [employer].    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    Lives alone.    Pt has children: 2 sons - '72, '80; 1 dtr -'76; 9 grandchildren, 1 great grandchild   Current relationship status off/on. No complaint of sexual dysfunction. Regular Exercise- yes, tennis 3 x a week. Resistance training. Discussed ACP and referred to [email or web address] for his consideration (Dec '14)    Enjoys playing tennis.    Social Determinants of Health   Financial Resource Strain: Low Risk    Difficulty of Paying Living Expenses: Not hard at all  Food Insecurity: No Food Insecurity   Worried About Charity fundraiser in the Last Year: Never true   [place] in the Last Year: Never true  Transportation Needs: No Transportation Needs   Lack of Transportation (Medical): No   Lack of Transportation (Non-Medical): No  Physical Activity: Sufficiently Active   Days of Exercise per Week: 4 days   Minutes of Exercise per Session: 60  min  Stress: No Stress Concern Present   Feeling of Stress : Not at all  Social Connections: Not on file    Tobacco Counseling Counseling given: Not Answered   Clinical Intake:     Pain : No/denies pain     Nutritional Status: BMI 25 -29 Overweight Nutritional Risks: None Diabetes: No  How often do you need to have someone help you when you read instructions, pamphlets, or other written materials from your doctor or pharmacy?: 1 - Never What is the last grade level you completed in school?: 12th grade  Diabetic? no  Interpreter Needed?: No  Information entered  by :: NAllen LPN   Activities of Daily Living    09/02/2021    4:02 PM 09/01/2021    8:02 PM  In your present state of health, do you have any difficulty performing the following activities:  Hearing? 0 0  Vision? 0 0  Difficulty concentrating or making decisions? 0 0  Walking or climbing stairs? 0 0  Dressing or bathing? 0 0  Doing errands, shopping? 0 0  Preparing Food and eating ? N N  Using the Toilet? N N  In the past six months, have you accidently leaked urine? N N  Do you have problems with loss of bowel control? N N  Managing your Medications? N N  Managing your Finances? N N  Housekeeping or managing your Housekeeping? N N    Patient Care Team: Tonia Ghent, MD as PCP - General (Family Medicine) Stefanie Libel, MD (Family Medicine) Barbaraann Cao, OD (Optometry)  Indicate any recent Medical Services you may have received from other than [facility] providers in the past year (date may be approximate).     Assessment:   This is a routine wellness examination for Joel Lopez.  Hearing/Vision screen Vision Screening - Comments:: Regular eye exams, Dr. Marica Otter  Dietary issues and exercise activities discussed: Current Exercise Habits: Home exercise routine, Type of exercise: Other - see comments;walking (tennis), Time (Minutes): 60, Frequency (Times/Week): 4, Weekly Exercise  (Minutes/Week): 240   Goals Addressed             This Visit's Progress    Patient Stated       09/02/2021, want to have more Lopez       Depression Screen    09/02/2021    4:02 PM 03/29/2020    2:11 PM 05/25/2017    9:27 AM  PHQ 2/9 Scores  PHQ - 2 Score 0 0 0    Fall Risk    09/02/2021    4:01 PM 09/01/2021    8:02 PM  [place] in the past year? 1 0  Comment playing tennis   Number falls in past yr: 1 1  Injury with Fall? 1 1  Comment dislocated finger   Risk for fall due to : Medication side effect   Follow up Falls evaluation completed;Education provided;Falls prevention discussed     FALL RISK PREVENTION PERTAINING TO THE HOME:  Any stairs in or around the home? No  If so, are there any without handrails?  N/a Home free of loose throw rugs in walkways, pet beds, electrical cords, etc? Yes  Adequate lighting in your home to reduce risk of falls? Yes   ASSISTIVE DEVICES UTILIZED TO PREVENT FALLS:  Life alert? No  Use of a cane, walker or w/c? No  Grab bars in the bathroom? No  Shower chair or bench in shower? No  Elevated toilet seat or a handicapped toilet? No   TIMED UP AND GO:  Was the test performed? No .      Cognitive Function:        09/02/2021    4:03 PM  6CIT Screen  What Year? 0 points  What month? 0 points  What time? 0 points  Count back from 20 0 points  Months in reverse 0 points  Repeat phrase 0 points  Total Score 0 points    Immunizations Immunization History  Administered Date(s) Administered   DTP 12/03/2008   Fluad Quad(high Dose 65+) 01/23/2019, 01/29/2020   Influenza,inj,Quad PF,6+ Mos 05/25/2017,  02/15/2018   Influenza-Unspecified 01/26/2021   PFIZER(Purple Top)SARS-COV-2 Vaccination 05/30/2019, 06/23/2019, 01/22/2020, 01/28/2021   Pneumococcal Conjugate-13 01/29/2020   Pneumococcal Polysaccharide-23 11/12/2018   Td 12/03/2008   Tdap 11/12/2018   Zoster Recombinat (Shingrix) 03/29/2020, 06/02/2020     TDAP status: Up to date  Flu Vaccine status: Up to date  Pneumococcal vaccine status: Up to date  Covid-19 vaccine status: Completed vaccines  Qualifies for Shingles Vaccine? Yes   Zostavax completed Yes   Shingrix Completed?: Yes  Screening Tests Health Maintenance  Topic Date Due   COVID-19 Vaccine (5 - Booster for Pfizer series) 03/25/2021   INFLUENZA VACCINE  11/15/2021   COLONOSCOPY (Pts 45-13yr Insurance coverage will need to be confirmed)  01/25/2023   TETANUS/TDAP  11/11/2028   Pneumonia Vaccine 71 Years old  Completed   Hepatitis C Screening  Completed   Zoster Vaccines- Shingrix  Completed   HPV VACCINES  Aged Out    Health Maintenance  Health Maintenance Due  Topic Date Due   COVID-19 Vaccine (5 - Booster for PCharitonseries) 03/25/2021    Colorectal cancer screening: Type of screening: Colonoscopy. Completed 01/24/2018. Repeat every 5 years  Lung Cancer Screening: (Low Dose CT Chest recommended if Age 69-80years, 30 pack-year currently smoking OR have quit w/in 15years.) does not qualify.   Lung Cancer Screening Referral:  no  Additional Screening:  Hepatitis C Screening: does qualify; Completed 01/14/2015  Vision Screening: Recommended annual ophthalmology exams for early detection of glaucoma and other disorders of the eye. Is the patient up to date with their annual eye exam?  Yes  Who is the provider or what is the name of the office in which the patient attends annual eye exams? Dr. MSabra HeckIf pt is not established with a provider, would they like to be referred to a provider to establish care? No .   Dental Screening: Recommended annual dental exams for proper oral hygiene  Community Resource Referral / Chronic Care Management: CRR required this visit?  No   CCM required this visit?  No      Plan:     I have personally reviewed and noted the following in the patient's chart:   Medical and social history Use of alcohol, tobacco or  illicit drugs  Current medications and supplements including opioid prescriptions. Patient is not currently taking opioid prescriptions. Functional ability and status Nutritional status Physical activity Advanced directives List of other physicians Hospitalizations, surgeries, and ER visits in previous 12 months Vitals Screenings to include cognitive, depression, and falls Referrals and appointments  In addition, I have reviewed and discussed with patient certain preventive protocols, quality metrics, and best practice recommendations. A written personalized care plan for preventive services as well as general preventive health recommendations were provided to patient.     NKellie Simmering LPN   [id number]  Nurse Notes: none  Due to this being a virtual visit, the after visit summary with patients personalized plan was offered to patient via mail or my-chart. Patient would like to access on my-chart

## 2021-10-06 IMAGING — DX DG ABD PORTABLE 1V
1 series · 2 of 2 positions shown · non-contrast
Comparison: 08/15/2019

CLINICAL DATA: Ileus

EXAM:
PORTABLE ABDOMEN - 1 VIEW

[Series 1: abdomen · 0.14mm/px · 2 of 2 slices shown]
[im 1/2]
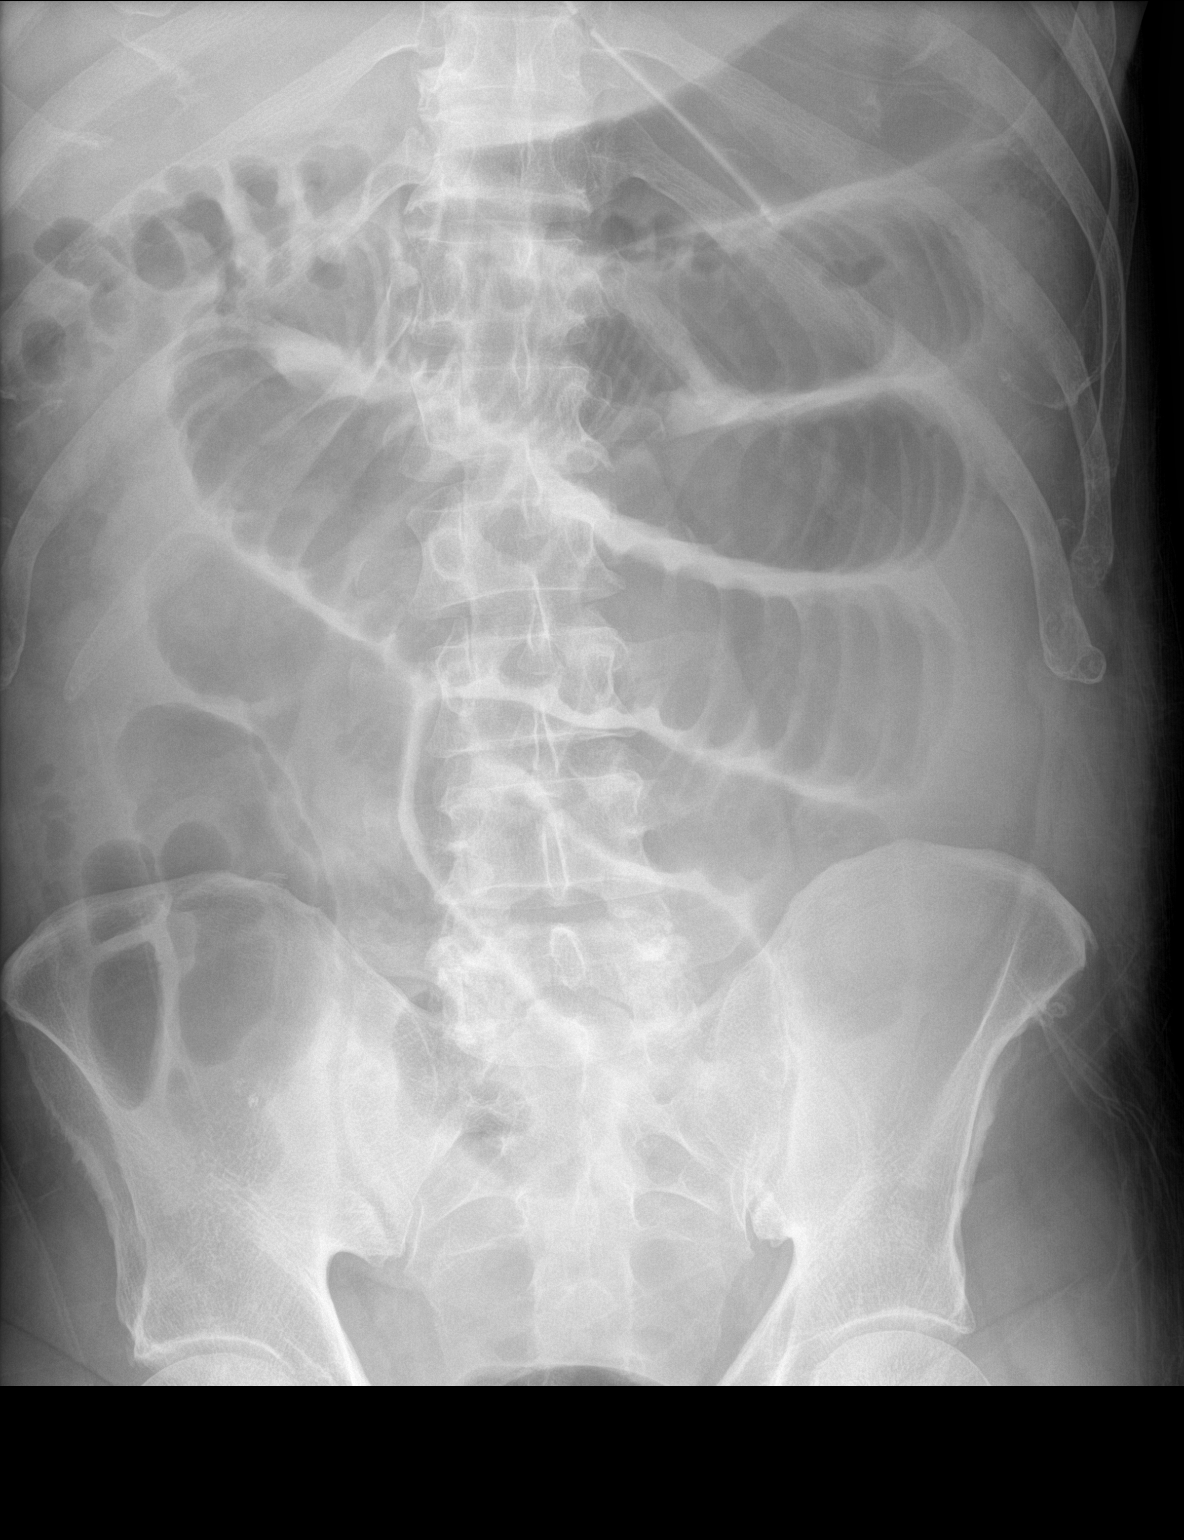
[im 2/2]
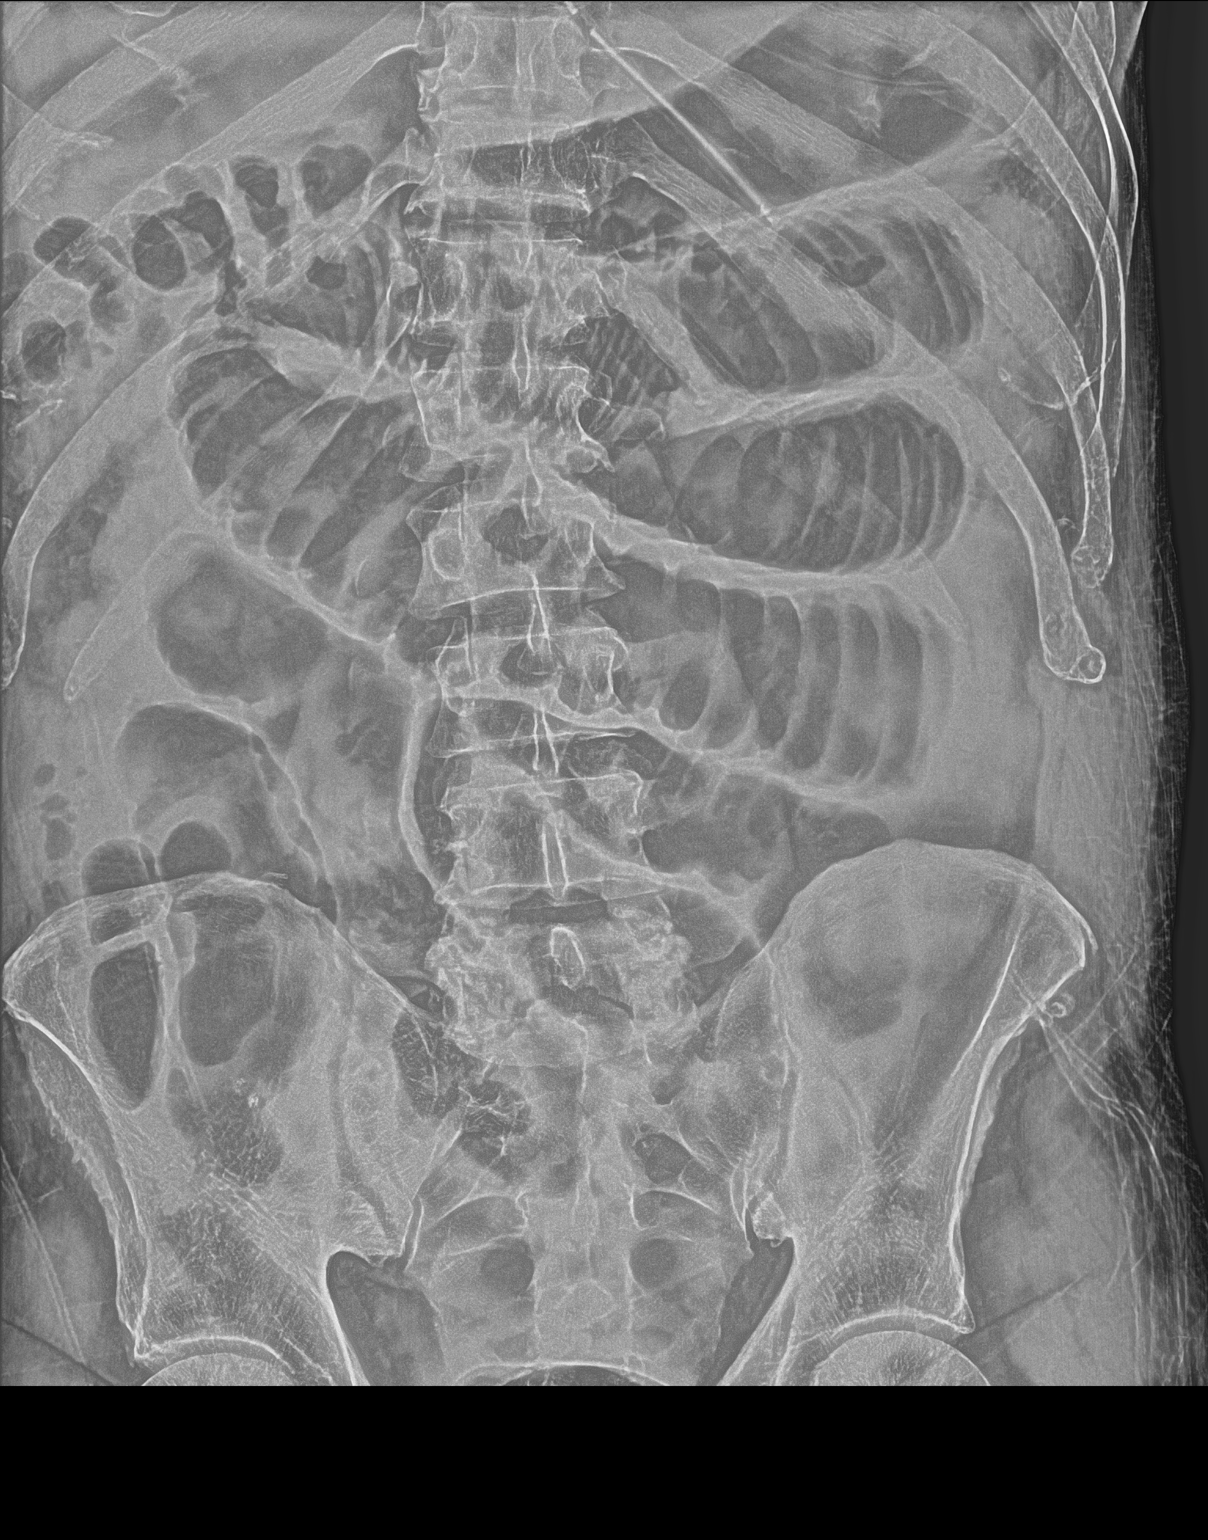

[2 of 2 positions shown; findings below may reference images not displayed]

FINDINGS: The patient's enteric tube terminates over the gastric body. There
are dilated loops of small bowel measuring up to approximately
cm. These have worsened since the prior study. There is no evidence
for pneumatosis or free air, however evaluation is limited by a
single view technique.
IMPRESSION: Worsening small bowel dilatation which may represent an ileus or
small bowel obstruction.

## 2021-10-25 ENCOUNTER — Inpatient Hospital Stay: Payer: Medicare HMO | Attending: Hematology and Oncology

## 2021-10-25 ENCOUNTER — Other Ambulatory Visit: Payer: Self-pay

## 2021-10-25 ENCOUNTER — Ambulatory Visit (HOSPITAL_COMMUNITY)
Admission: RE | Admit: 2021-10-25 | Discharge: 2021-10-25 | Disposition: A | Discharge status: Home / Self Care | Payer: Medicare HMO | Source: Ambulatory Visit | Attending: Hematology and Oncology | Admitting: Hematology and Oncology

## 2021-10-25 ENCOUNTER — Telehealth: Payer: Self-pay | Admitting: *Deleted

## 2021-10-25 DIAGNOSIS — I82432 Acute embolism and thrombosis of left popliteal vein: Secondary | ICD-10-CM | POA: Insufficient documentation

## 2021-10-25 LAB — CMP (CANCER CENTER ONLY)
ALT: 18 U/L (ref 0–44)
AST: 20 U/L (ref 15–41)
Albumin: 4.2 g/dL (ref 3.5–5.0)
Alkaline Phosphatase: 72 U/L (ref 38–126)
Anion gap: 5 (ref 5–15)
BUN: 19 mg/dL (ref 8–23)
CO2: 28 mmol/L (ref 22–32)
Calcium: 9.5 mg/dL (ref 8.9–10.3)
Chloride: 105 mmol/L (ref 98–111)
Creatinine: 1.1 mg/dL (ref 0.61–1.24)
GFR, Estimated: >60 mL/min (ref >60)
Glucose, Bld: 88 mg/dL (ref 70–99)
Potassium: 4.2 mmol/L (ref 3.5–5.1)
Sodium: 138 mmol/L (ref 135–145)
Total Bilirubin: 1.2 mg/dL (ref 0.3–1.2)
Total Protein: 7.2 g/dL (ref 6.5–8.1)

## 2021-10-25 LAB — D-DIMER, QUANTITATIVE: D-Dimer, Quant: <0.27 ug/mL-FEU (ref 0.00–0.50)

## 2021-10-25 NOTE — Progress Notes (Signed)
Bilateral lower extremity venous duplex completed. Refer to "CV Proc" under chart review to view preliminary results.  10/25/2021 10:35 AM Kelby Aline., MHA, RVT, RDCS, RDMS

## 2021-10-25 NOTE — Telephone Encounter (Signed)
Contacted by Kelby Aline, Vascular Tech w/[facility]. Results of Bilateral Venous Duplex completed today 10/25/21 also found in chart:  RIGHT:  - There is no evidence of deep vein thrombosis in the lower extremity.  - No cystic structure found in the popliteal fossa.  LEFT:  - There is no evidence of deep vein thrombosis in the lower extremity.  - No cystic structure found in the popliteal fossa.  When compared to prior studies, there is resolution of previously noted  DVT, bilaterally.   Routed to Dr. Alvy Bimler - Juluis Rainier

## 2021-10-27 ENCOUNTER — Other Ambulatory Visit: Payer: Self-pay

## 2021-10-27 ENCOUNTER — Inpatient Hospital Stay (HOSPITAL_BASED_OUTPATIENT_CLINIC_OR_DEPARTMENT_OTHER): Payer: Medicare HMO | Admitting: Hematology and Oncology

## 2021-10-27 ENCOUNTER — Encounter: Payer: Self-pay | Admitting: Hematology and Oncology

## 2021-10-27 DIAGNOSIS — I82432 Acute embolism and thrombosis of left popliteal vein: Secondary | ICD-10-CM | POA: Diagnosis not present

## 2021-10-27 NOTE — Assessment & Plan Note (Addendum)
His recent blood work and imaging study showed no evidence of DVT He does not need long-term follow-up We discussed the risk and benefits of preventative dose with aspirin We discussed risk factors associated with recurrent DVT such as dehydration, sedentary lifestyle, smoking, surgery, chronic immobility with long distance travel and others He could benefit from aggressive DVT prophylaxis in the future if he undergoes surgical procedure.  I will be happy to assist in perioperative anticoagulation management

## 2021-10-27 NOTE — Progress Notes (Signed)
[place] OFFICE PROGRESS NOTE  Joel Ghent, MD  ASSESSMENT & PLAN:  Acute deep vein thrombosis (DVT) of popliteal vein of left lower extremity (Joel Lopez) His recent blood work and imaging study showed no evidence of DVT He does not need long-term follow-up We discussed the risk and benefits of preventative dose with aspirin We discussed risk factors associated with recurrent DVT such as dehydration, sedentary lifestyle, smoking, surgery, chronic immobility with long distance travel and others He could benefit from aggressive DVT prophylaxis in the future if he undergoes surgical procedure.  I will be happy to assist in perioperative anticoagulation management  No orders of the defined types were placed in this encounter.   The total time spent in the appointment was 20 minutes encounter with patients including review of chart and various tests results, discussions about plan of care and coordination of care plan   All questions were answered. The patient knows to call the clinic with any problems, questions or concerns. No barriers to learning was detected.    Joel Lark, MD 7/13/202311:47 AM  INTERVAL HISTORY: Joel Lopez 71 y.o. male returns for review of test results The patient was seen due to history of provoked DVT He is doing well No bleeding complications related to anticoagulation therapy We discussed findings on recent blood work and imaging We also discussed the rationale of discontinuation of anticoagulation therapy Overall, I do not believe he had recurrent DVT, rather, he might have unresolved DVT from his first initial clot  SUMMARY OF HEMATOLOGIC HISTORY: Joel Lopez was seen here because of recent findings of recurrent DVT  The patient is retired and healthy.  He plays tennis on a regular basis. He was recently diagnosed with possible recurrent left lower extremity DVT and was placed on anticoagulation therapy In April 2021, the  patient was hospitalized for acute appendicitis and peritonitis The patient was hospitalized for over 2 weeks, complicated by lower extremity DVT On Aug 26, 2019, venous Doppler ultrasound of both lower extremity revealed  RIGHT:  - Findings consistent with acute deep vein thrombosis involving the right posterior tibial veins, and right peroneal veins.     LEFT:  - Findings consistent with acute deep vein thrombosis involving the left peroneal veins.  He was appropriately anticoagulated and completed 7 months worth of anticoagulation treatment with Xarelto He has intermittent lower extremity edema of both legs especially in the evening Most recently, he complained of left lower extremity tightness and discomfort  He denies warmth, tenderness & erythema of the leg.  He denies recent chest pain on exertion, shortness of breath on minimal exertion, pre-syncopal episodes, hemoptysis, or palpitation. He denies recent history of trauma, long distance travel, dehydration, recent surgery, smoking or prolonged immobilization. He had no prior history or diagnosis of cancer. His age appropriate screening programs are up-to-date.  On April 26, 2021, he had repeat left lower extremity ultrasound which showed acute appearing nonocclusive left popliteal DVT extending into the tibial and gastrocnemius calf veins. He was placed on Xarelto again.  He had prior surgeries before and never had perioperative thromboembolic events until his surgery in 2021. The patient had not used testosterone replacement therapy   There is no family history of blood clots or miscarriages. On 10/25/2021, repeat ultrasound venous Doppler showed complete resolution of DVT  I have reviewed the past medical history, past surgical history, social history and family history with the patient and they are unchanged from previous note.  ALLERGIES:  is allergic to shellfish allergy.  MEDICATIONS:  Current Outpatient Medications   Medication Sig Dispense Refill   dexamethasone 0.5 MG/5ML elixir Take by mouth.     Multiple Vitamin (MULTIVITAMIN) tablet Take 1 tablet by mouth daily.     rivaroxaban (XARELTO) 20 MG TABS tablet Take 1 tablet (20 mg total) by mouth daily with supper. Use this prescription after completing starter pack 30 tablet 12   RIVAROXABAN (XARELTO) VTE STARTER PACK (15 & 20 MG) Follow package directions: Take one '15mg'$  tablet by mouth twice a day. On day 22, switch to one '20mg'$  tablet once a day. Take with food. 51 each 0   No current facility-administered medications for this visit.     REVIEW OF SYSTEMS:   Constitutional: Denies fevers, chills or night sweats Eyes: Denies blurriness of vision Ears, nose, mouth, throat, and face: Denies mucositis or sore throat Respiratory: Denies cough, dyspnea or wheezes Cardiovascular: Denies palpitation, chest discomfort or lower extremity swelling Gastrointestinal:  Denies nausea, heartburn or change in bowel habits Skin: Denies abnormal skin rashes Lymphatics: Denies new lymphadenopathy or easy bruising Neurological:Denies numbness, tingling or new weaknesses Behavioral/Psych: Mood is stable, no new changes  All other systems were reviewed with the patient and are negative.  PHYSICAL EXAMINATION: ECOG PERFORMANCE STATUS: 0 - Asymptomatic  Vitals:   10/27/21 1116  BP: 123/76  Pulse: (!) 58  Resp: 18  Temp: 97.9 F (36.6 C)  SpO2: 100%   Filed Weights   10/27/21 1116  Weight: 191 lb 9.6 oz (86.9 kg)    GENERAL:alert, no distress and comfortable NEURO: alert & oriented x 3 with fluent speech, no focal motor/sensory deficits  LABORATORY DATA:  I have reviewed the data as listed     Component Value Date/Time   NA 138 10/25/2021 1056   K 4.2 10/25/2021 1056   CL 105 10/25/2021 1056   CO2 28 10/25/2021 1056   GLUCOSE 88 10/25/2021 1056   GLUCOSE 82 03/22/2006 0834   BUN 19 10/25/2021 1056   CREATININE 1.10 10/25/2021 1056   CALCIUM 9.5  10/25/2021 1056   PROT 7.2 10/25/2021 1056   ALBUMIN 4.2 10/25/2021 1056   AST 20 10/25/2021 1056   ALT 18 10/25/2021 1056   ALKPHOS 72 10/25/2021 1056   BILITOT 1.2 10/25/2021 1056   GFRNONAA >60 10/25/2021 1056   GFRAA >60 08/27/2019 0312    No results found for: "SPEP", "UPEP"  Lab Results  Component Value Date   WBC 7.3 03/16/2021   NEUTROABS 4.8 03/16/2021   HGB 13.6 03/16/2021   HCT 42.7 03/16/2021   MCV 87.1 03/16/2021   PLT 267 03/16/2021      Chemistry      Component Value Date/Time   NA 138 10/25/2021 1056   K 4.2 10/25/2021 1056   CL 105 10/25/2021 1056   CO2 28 10/25/2021 1056   BUN 19 10/25/2021 1056   CREATININE 1.10 10/25/2021 1056      Component Value Date/Time   CALCIUM 9.5 10/25/2021 1056   ALKPHOS 72 10/25/2021 1056   AST 20 10/25/2021 1056   ALT 18 10/25/2021 1056   BILITOT 1.2 10/25/2021 1056

## 2021-12-12 ENCOUNTER — Ambulatory Visit: Payer: Medicare HMO | Admitting: Family Medicine

## 2022-02-21 DIAGNOSIS — H5203 Hypermetropia, bilateral: Secondary | ICD-10-CM | POA: Diagnosis not present

## 2022-04-04 DIAGNOSIS — R310 Gross hematuria: Secondary | ICD-10-CM | POA: Diagnosis not present

## 2022-04-04 DIAGNOSIS — R350 Frequency of micturition: Secondary | ICD-10-CM | POA: Diagnosis not present

## 2022-04-04 DIAGNOSIS — N5201 Erectile dysfunction due to arterial insufficiency: Secondary | ICD-10-CM | POA: Diagnosis not present

## 2022-04-04 DIAGNOSIS — Z125 Encounter for screening for malignant neoplasm of prostate: Secondary | ICD-10-CM | POA: Diagnosis not present

## 2022-05-03 ENCOUNTER — Other Ambulatory Visit: Payer: Self-pay | Admitting: Family Medicine

## 2022-05-03 DIAGNOSIS — Z125 Encounter for screening for malignant neoplasm of prostate: Secondary | ICD-10-CM

## 2022-05-03 DIAGNOSIS — Z86718 Personal history of other venous thrombosis and embolism: Secondary | ICD-10-CM

## 2022-05-03 DIAGNOSIS — Z8639 Personal history of other endocrine, nutritional and metabolic disease: Secondary | ICD-10-CM

## 2022-05-12 ENCOUNTER — Other Ambulatory Visit (INDEPENDENT_AMBULATORY_CARE_PROVIDER_SITE_OTHER): Payer: Medicare HMO

## 2022-05-12 DIAGNOSIS — Z125 Encounter for screening for malignant neoplasm of prostate: Secondary | ICD-10-CM | POA: Diagnosis not present

## 2022-05-12 DIAGNOSIS — Z8639 Personal history of other endocrine, nutritional and metabolic disease: Secondary | ICD-10-CM | POA: Diagnosis not present

## 2022-05-12 DIAGNOSIS — Z86718 Personal history of other venous thrombosis and embolism: Secondary | ICD-10-CM

## 2022-05-12 LAB — PSA, MEDICARE: PSA: 0.94 ng/ml (ref 0.10–4.00)

## 2022-05-12 LAB — COMPREHENSIVE METABOLIC PANEL
ALT: 19 U/L (ref 0–53)
AST: 21 U/L (ref 0–37)
Albumin: 4 g/dL (ref 3.5–5.2)
Alkaline Phosphatase: 64 U/L (ref 39–117)
BUN: 21 mg/dL (ref 6–23)
CO2: 29 mEq/L (ref 19–32)
Calcium: 9.3 mg/dL (ref 8.4–10.5)
Chloride: 106 mEq/L (ref 96–112)
Creatinine, Ser: 0.86 mg/dL (ref 0.40–1.50)
GFR: 88.25 mL/min (ref >60.00)
Glucose, Bld: 85 mg/dL (ref 70–99)
Potassium: 4.6 mEq/L (ref 3.5–5.1)
Sodium: 141 mEq/L (ref 135–145)
Total Bilirubin: 0.9 mg/dL (ref 0.2–1.2)
Total Protein: 6.6 g/dL (ref 6.0–8.3)

## 2022-05-12 LAB — CBC WITH DIFFERENTIAL/PLATELET
Basophils Absolute: 0 10*3/uL (ref 0.0–0.1)
Basophils Relative: 0.1 % (ref 0.0–3.0)
Eosinophils Absolute: 0.3 10*3/uL (ref 0.0–0.7)
Eosinophils Relative: 8.9 % — ABNORMAL HIGH (ref 0.0–5.0)
HCT: 38.2 % — ABNORMAL LOW (ref 39.0–52.0)
Hemoglobin: 12.5 g/dL — ABNORMAL LOW (ref 13.0–17.0)
Lymphocytes Relative: 44.7 % (ref 12.0–46.0)
Lymphs Abs: 1.4 10*3/uL (ref 0.7–4.0)
MCHC: 32.6 g/dL (ref 30.0–36.0)
MCV: 86 fl (ref 78.0–100.0)
Monocytes Absolute: 0.4 10*3/uL (ref 0.1–1.0)
Monocytes Relative: 13.6 % — ABNORMAL HIGH (ref 3.0–12.0)
Neutro Abs: 1 10*3/uL — ABNORMAL LOW (ref 1.4–7.7)
Neutrophils Relative %: 32.7 % — ABNORMAL LOW (ref 43.0–77.0)
Platelets: 248 10*3/uL (ref 150.0–400.0)
RBC: 4.44 Mil/uL (ref 4.22–5.81)
RDW: 14.1 % (ref 11.5–15.5)
WBC: 3.1 10*3/uL — ABNORMAL LOW (ref 4.0–10.5)

## 2022-05-12 LAB — LIPID PANEL
Cholesterol: 197 mg/dL (ref 0–200)
HDL: 57.8 mg/dL (ref >39.00)
LDL Cholesterol: 130 mg/dL — ABNORMAL HIGH (ref 0–99)
NonHDL: 138.72
Total CHOL/HDL Ratio: 3
Triglycerides: 45 mg/dL (ref 0.0–149.0)
VLDL: 9 mg/dL (ref 0.0–40.0)

## 2022-05-16 ENCOUNTER — Encounter: Payer: Medicare HMO | Admitting: Family Medicine

## 2022-05-25 DIAGNOSIS — Z0100 Encounter for examination of eyes and vision without abnormal findings: Secondary | ICD-10-CM | POA: Diagnosis not present

## 2022-05-26 ENCOUNTER — Ambulatory Visit (INDEPENDENT_AMBULATORY_CARE_PROVIDER_SITE_OTHER): Payer: Medicare HMO | Admitting: Family Medicine

## 2022-05-26 ENCOUNTER — Encounter: Payer: Self-pay | Admitting: Family Medicine

## 2022-05-26 VITALS — BP 130/62 | HR 70 | Temp 97.3°F | Ht 71.0 in | Wt 189.0 lb

## 2022-05-26 DIAGNOSIS — Z7189 Other specified counseling: Secondary | ICD-10-CM

## 2022-05-26 DIAGNOSIS — K219 Gastro-esophageal reflux disease without esophagitis: Secondary | ICD-10-CM | POA: Diagnosis not present

## 2022-05-26 DIAGNOSIS — R202 Paresthesia of skin: Secondary | ICD-10-CM

## 2022-05-26 DIAGNOSIS — Z713 Dietary counseling and surveillance: Secondary | ICD-10-CM

## 2022-05-26 DIAGNOSIS — R000 Tachycardia, unspecified: Secondary | ICD-10-CM | POA: Diagnosis not present

## 2022-05-26 DIAGNOSIS — Z0000 Encounter for general adult medical examination without abnormal findings: Secondary | ICD-10-CM | POA: Diagnosis not present

## 2022-05-26 LAB — TSH: TSH: 0.82 u[IU]/mL (ref 0.35–5.50)

## 2022-05-26 LAB — VITAMIN B12: Vitamin B-12: 653 pg/mL (ref 211–911)

## 2022-05-26 NOTE — Assessment & Plan Note (Signed)
Living will d/w pt. Would have his son Joel Lopez designated if patient were incapacitated.

## 2022-05-26 NOTE — Patient Instructions (Addendum)
Please ask the clinic to send me a copy of your visit from next week. Go to the lab on the way out.   If you have mychart we'll likely use that to update you.    Take care.  Glad to see you. Let me know if you don't get a call about seeing cardiology.  I would limit hard exertion in the meantime.

## 2022-05-26 NOTE — Progress Notes (Unsigned)
I have personally reviewed the Medicare Annual Wellness questionnaire and have noted 1. The patient's medical and social history 2. Their use of alcohol, tobacco or illicit drugs 3. Their current medications and supplements 4. The patient's functional ability including ADL's, fall risks, home safety risks and hearing or visual             impairment. 5. Diet and physical activities 6. Evidence for depression or mood disorders  The patients weight, height, BMI have been recorded in the chart and visual acuity is per eye clinic.  I have made referrals, counseling and provided education to the patient based review of the above and I have provided the pt with a written personalized care plan for preventive services.  Provider list updated- see scanned forms.  Routine anticipatory guidance given to patient.  See health maintenance. The possibility exists that previously documented standard health maintenance information may have been brought forward from a previous encounter into this note.  If needed, that same information has been updated to reflect the current situation based on today's encounter.    Flu Shingles PNA Tetanus Colon  Breast cancer screening Prostate cancer screening Living will d/w pt. Would have his son Kien Colombo designated if patient were incapacitated.  Cognitive function addressed- see scanned forms- and if abnormal then additional documentation follows.   In addition to [facility] Wellness, follow up visit for the below conditions:  He has f/u lesion pending re: oral lesion, lichen planus.    He is off anticoagulation, d/w pt.    Occ lightheaded with tennis, with hard exertion.   Noted last year, esp in the heat.  Then again this past Sunday.  No syncope.  No CP.  Better with short rest.  Occ sensation of elevated pulse, heart racing, can happen at rest.   PMH and SH reviewed  Meds, vitals, and allergies reviewed.   ROS: Per HPI.  Unless specifically indicated  otherwise in HPI, the patient denies:  General: fever. Eyes: acute vision changes ENT: sore throat Cardiovascular: chest pain Respiratory: SOB GI: vomiting GU: dysuria Musculoskeletal: acute back pain Derm: acute rash Neuro: acute motor dysfunction Psych: worsening mood Endocrine: polydipsia Heme: bleeding Allergy: hayfever  GEN: nad, alert and oriented HEENT: mucous membranes moist NECK: supple w/o LA CV: rrr. PULM: ctab, no inc wob ABD: soft, +bs EXT: no edema SKIN: no acute rash

## 2022-05-28 DIAGNOSIS — R202 Paresthesia of skin: Secondary | ICD-10-CM | POA: Insufficient documentation

## 2022-05-28 DIAGNOSIS — Z0000 Encounter for general adult medical examination without abnormal findings: Secondary | ICD-10-CM | POA: Insufficient documentation

## 2022-05-28 DIAGNOSIS — R000 Tachycardia, unspecified: Secondary | ICD-10-CM | POA: Insufficient documentation

## 2022-05-28 NOTE — Assessment & Plan Note (Signed)
Unclear source.  Reasonable to check labs for reversible causes.  No weakness.

## 2022-05-28 NOTE — Assessment & Plan Note (Signed)
Discussed trigger avoidance.  He can elevate the head of his bed if he needed.

## 2022-05-28 NOTE — Assessment & Plan Note (Signed)
Flu 2023 Shingles previously done PNA previously done Tetanus 2020 COVID-vaccine previously done Colonoscopy 2019 Prostate cancer screening 2024 Living will d/w pt. Would have his son Hersey Brockus designated if patient were incapacitated.  Cognitive function addressed- see scanned forms- and if abnormal then additional documentation follows.

## 2022-05-28 NOTE — Assessment & Plan Note (Signed)
Episodically noted, not with exertion.  He also noted lightheadedness with harder exertion.  EKG without acute changes.  See notes on labs.  Refer to cardiology.  Referral placed out of an abundance of caution.  Chest pain-free.  Okay for outpatient follow-up.  I asked him to limit heart exertion in the meantime.

## 2022-06-23 ENCOUNTER — Ambulatory Visit: Payer: Medicare HMO | Admitting: Cardiovascular Disease

## 2022-09-25 ENCOUNTER — Encounter: Payer: Self-pay | Admitting: Family Medicine

## 2022-09-25 ENCOUNTER — Ambulatory Visit (INDEPENDENT_AMBULATORY_CARE_PROVIDER_SITE_OTHER): Payer: Medicare HMO | Admitting: Family Medicine

## 2022-09-25 VITALS — BP 116/60 | HR 70 | Temp 97.2°F | Ht 71.0 in | Wt 182.0 lb

## 2022-09-25 DIAGNOSIS — B356 Tinea cruris: Secondary | ICD-10-CM

## 2022-09-25 DIAGNOSIS — I959 Hypotension, unspecified: Secondary | ICD-10-CM

## 2022-09-25 MED ORDER — NYSTATIN 100000 UNIT/GM EX POWD: 1.0000 | Freq: Two times a day (BID) | CUTANEOUS | 1 refills | Status: DC

## 2022-09-25 NOTE — Progress Notes (Unsigned)
Rash  On left groin area x 1 month. Patient states it is itchy and has been applying tinactin spray and that did help.  Not painful.  Did not completely resolve with Tinactin spray.  No FCNAVD.    He had prev noted lower BP after exercise.  Sx improved in the meantime.  Heat cautions d/w pt.  He has a salt ring on his hat after exercise, d/w pt about salt loss.  He is drinking more water and that may have helped.    Meds, vitals, and allergies reviewed.   ROS: Per HPI unless specifically indicated in ROS section   Nad Ncat Rrr ctab Superficial fungal rash on the L groin, not on the R.

## 2022-09-25 NOTE — Patient Instructions (Addendum)
Check your weight prior to and after exercise.  If you have >1 lbs loss, then I would increase water intake during exercise.   I would use nystatin powder twice a day until resolved and then for about 3-4 extra days.  Change/get dry after exercise.   Take care.  Glad to see you.

## 2022-09-27 DIAGNOSIS — B356 Tinea cruris: Secondary | ICD-10-CM | POA: Insufficient documentation

## 2022-09-27 DIAGNOSIS — I959 Hypotension, unspecified: Secondary | ICD-10-CM | POA: Insufficient documentation

## 2022-09-27 NOTE — Assessment & Plan Note (Signed)
Previously noted after exercise.  He has been drinking more water in the meantime that helped with his symptoms. I asked him to check his weight prior to and after exercise.  If >1 lbs loss, then I would increase water intake during exercise.  Update me as needed.  He agrees with plan.

## 2022-09-27 NOTE — Assessment & Plan Note (Signed)
I would use nystatin powder twice a day until resolved and then for about 3-4 extra days.  Change/get dry after exercise.  Update me as needed.

## 2022-12-05 DIAGNOSIS — Z7982 Long term (current) use of aspirin: Secondary | ICD-10-CM | POA: Diagnosis not present

## 2022-12-05 DIAGNOSIS — Z806 Family history of leukemia: Secondary | ICD-10-CM | POA: Diagnosis not present

## 2022-12-05 DIAGNOSIS — N529 Male erectile dysfunction, unspecified: Secondary | ICD-10-CM | POA: Diagnosis not present

## 2022-12-05 DIAGNOSIS — K056 Periodontal disease, unspecified: Secondary | ICD-10-CM | POA: Diagnosis not present

## 2022-12-05 DIAGNOSIS — L439 Lichen planus, unspecified: Secondary | ICD-10-CM | POA: Diagnosis not present

## 2022-12-05 DIAGNOSIS — R030 Elevated blood-pressure reading, without diagnosis of hypertension: Secondary | ICD-10-CM | POA: Diagnosis not present

## 2023-03-08 ENCOUNTER — Encounter: Payer: Self-pay | Admitting: Internal Medicine

## 2023-03-20 DIAGNOSIS — H524 Presbyopia: Secondary | ICD-10-CM | POA: Diagnosis not present

## 2023-05-07 IMAGING — DX DG CHEST 2V
2 series · 2 of 2 positions shown · non-contrast
Comparison: Portable chest 08/26/2019

CLINICAL DATA: Right-sided chest and abdomen pain. Shortness of
breath. Low-grade fever.

EXAM:
CHEST - 2 VIEW

[chest pa]
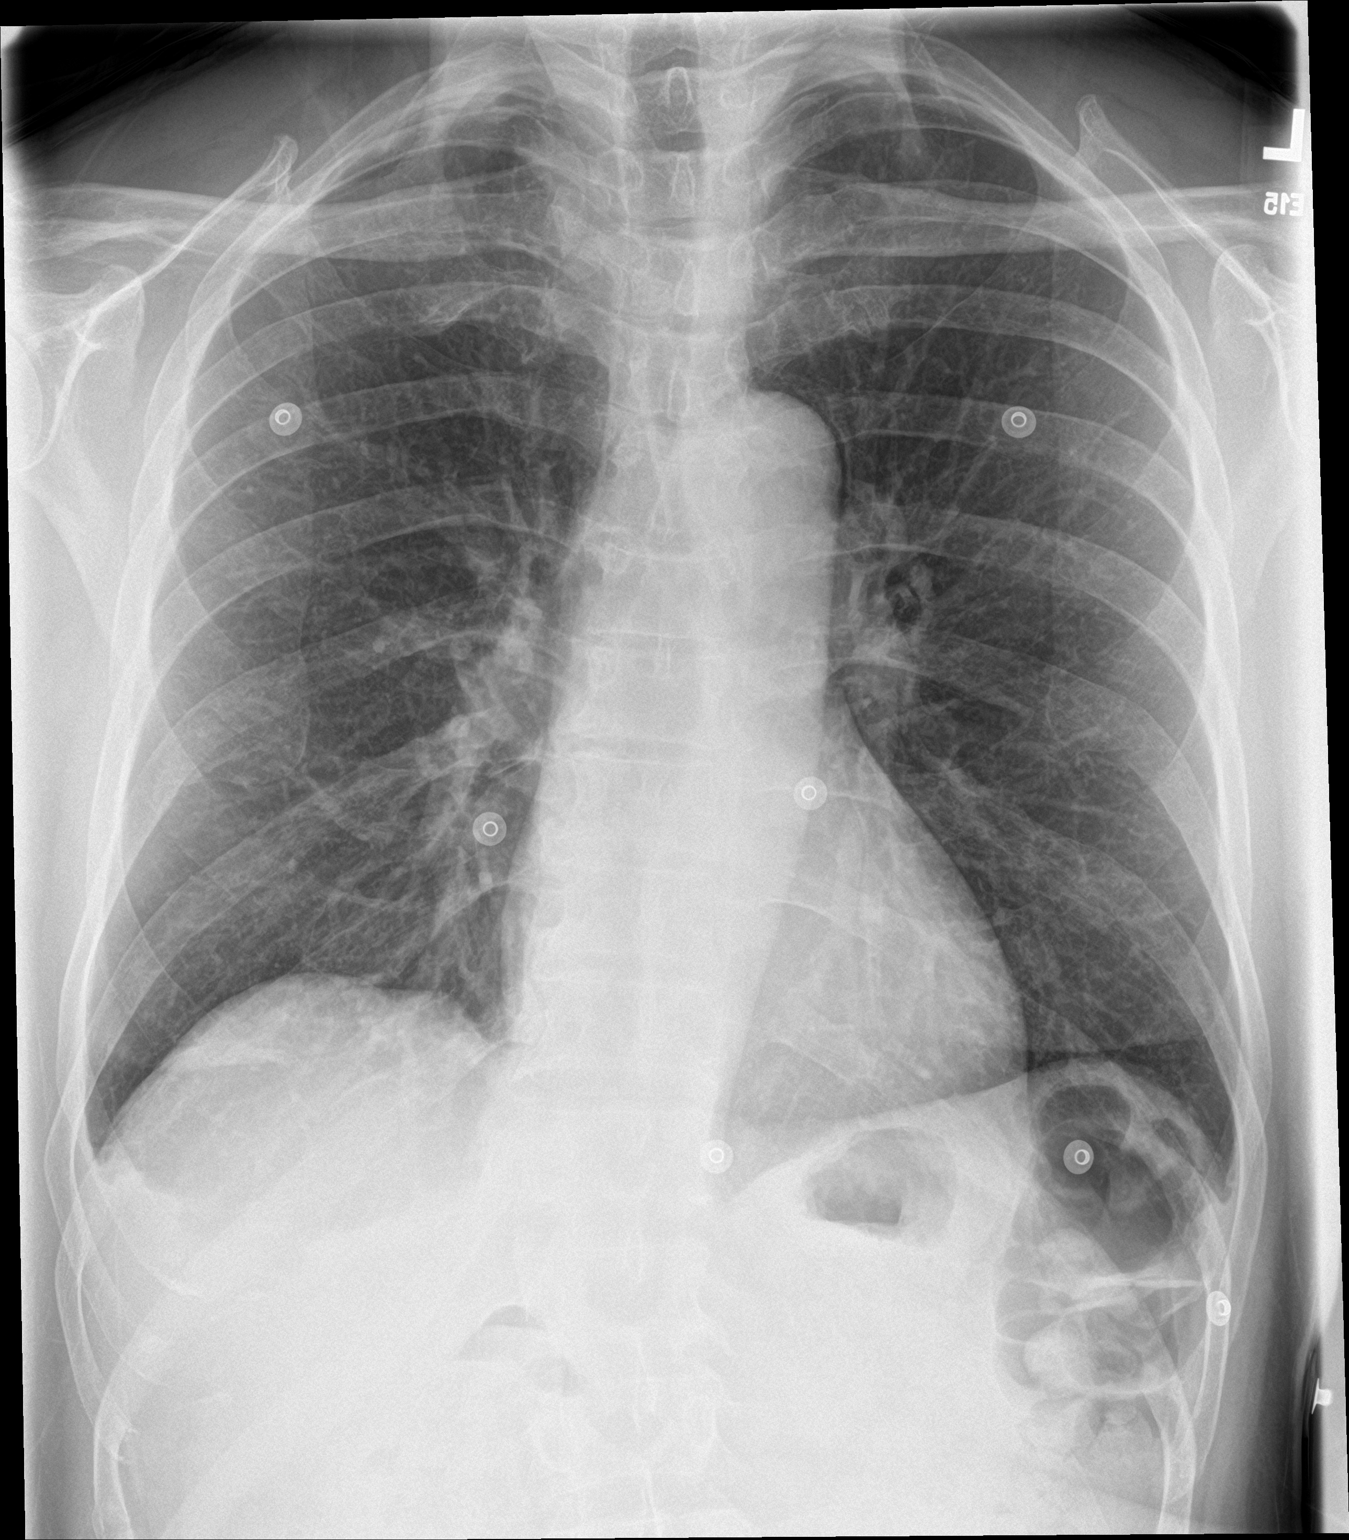

[chest lat]
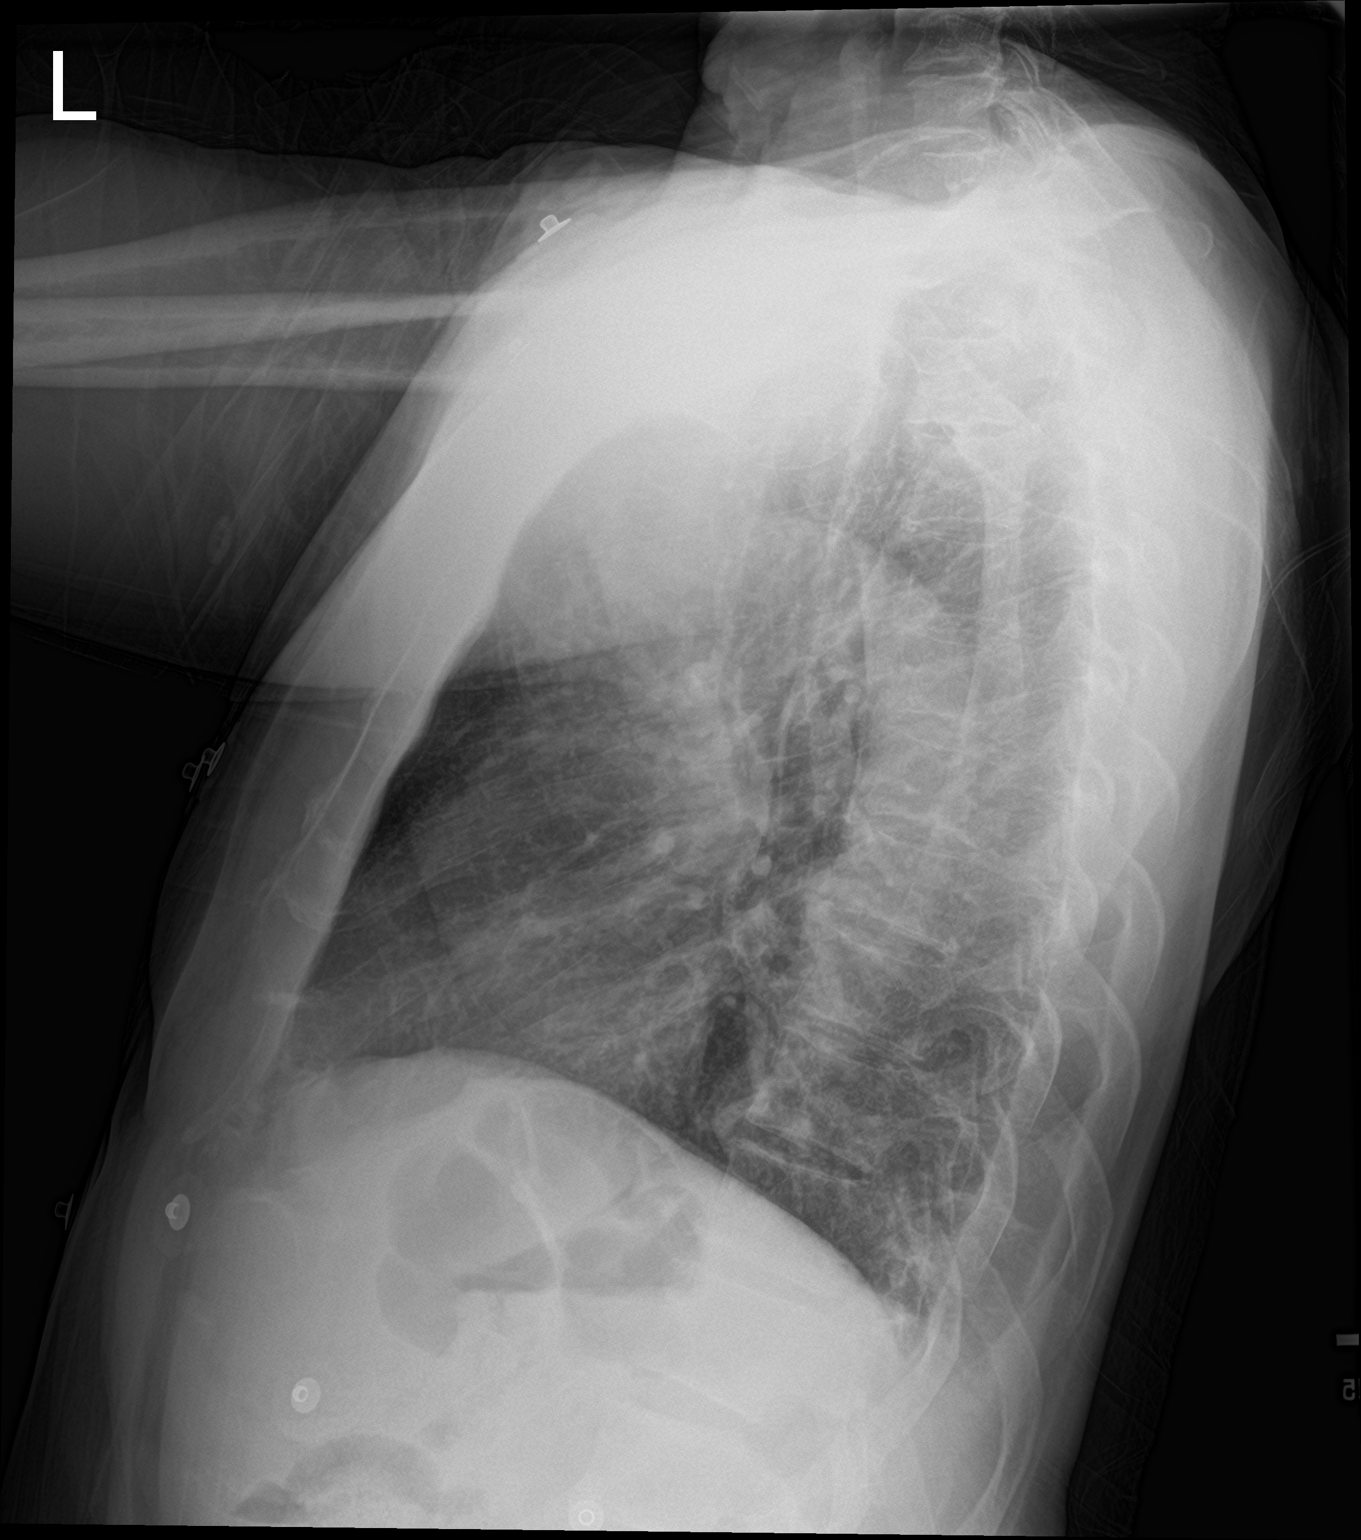

[2 of 2 positions shown; findings below may reference images not displayed]

FINDINGS: The heart size and mediastinal contours are within normal limits.
Both lungs are clear apart from trace pleural effusions. The
visualized skeletal structures are unremarkable.
IMPRESSION: Trace pleural effusions. No other evidence of acute chest disease.
The heart is normal in size. The lungs otherwise clear.

## 2023-05-08 IMAGING — CT CT ABD-PELV W/ CM
2 of 5 series · 16 of 46 positions shown, 18 images · IV contrast (Omni 300)
Comparison: CT examination dated August 26, 2018

CLINICAL DATA: Fever, right upper quadrant pain for 2 days.

EXAM:
CT ABDOMEN AND PELVIS WITH CONTRAST
TECHNIQUE: Multidetector CT imaging of the abdomen and pelvis was performed
using the standard protocol following bolus administration of
intravenous contrast.
CONTRAST:  100mL OMNIPAQUE IOHEXOL 300 MG/ML  SOLN

[Series 3: a/p w/ 5mm · axial · 0.90mm/px · z∈[+896,+1326]mm · 13 of 98 slices shown, 15 images]
[im 6/98  soft-tissue]
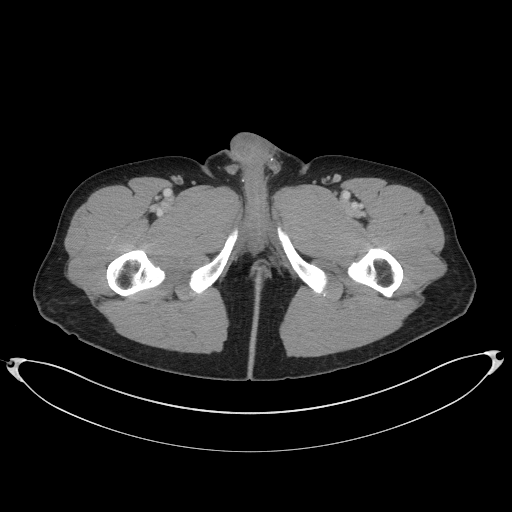
[im 6/98  bone]
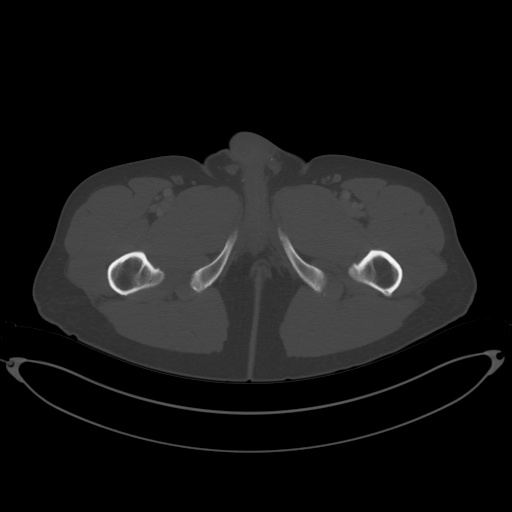
[im 16/98  soft-tissue]
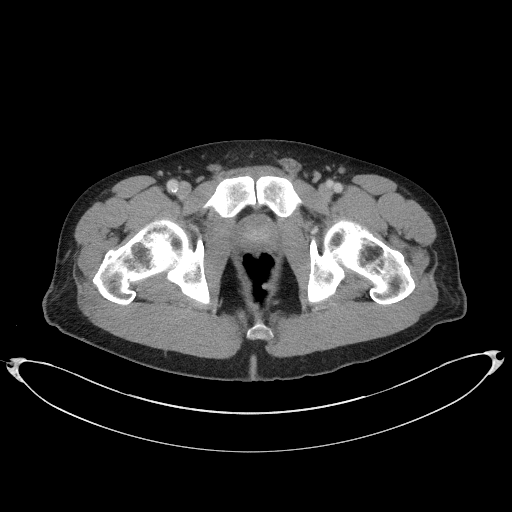
[im 21/98  soft-tissue]
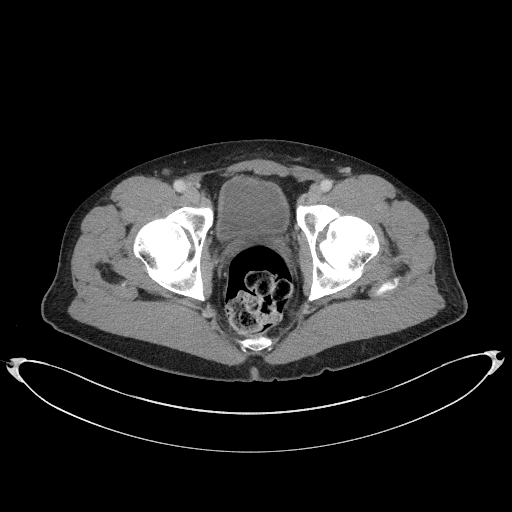
[im 26/98  soft-tissue]
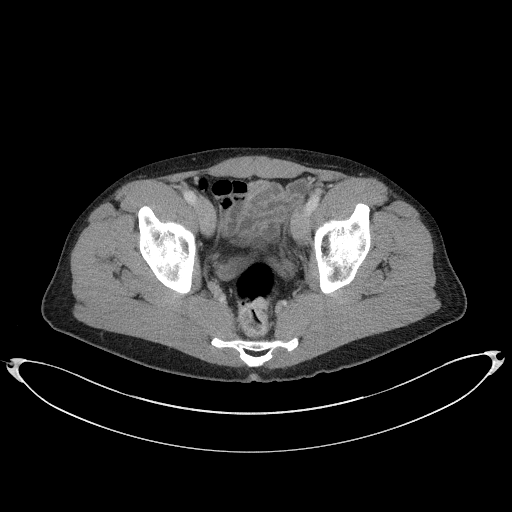
[im 36/98  soft-tissue]
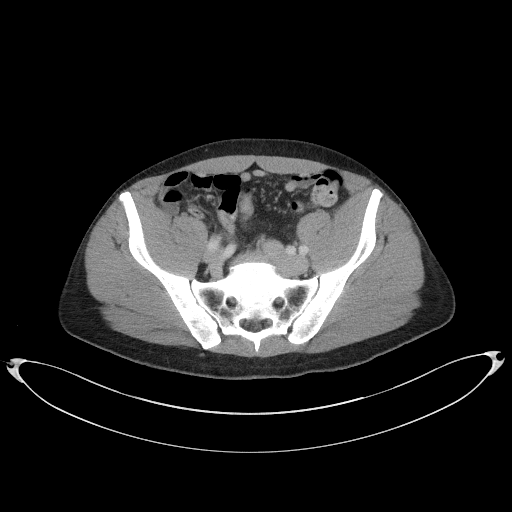
[im 41/98  soft-tissue]
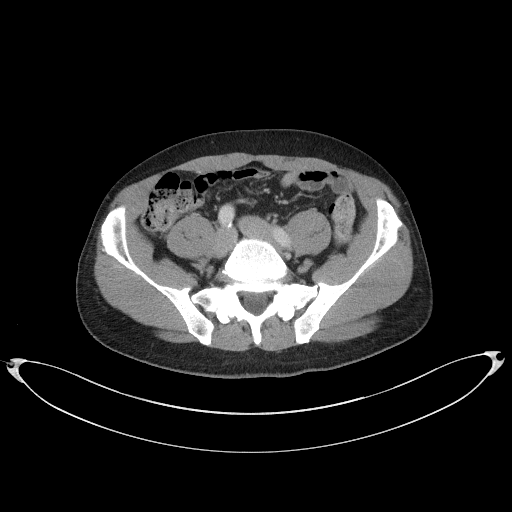
[im 52/98  soft-tissue]
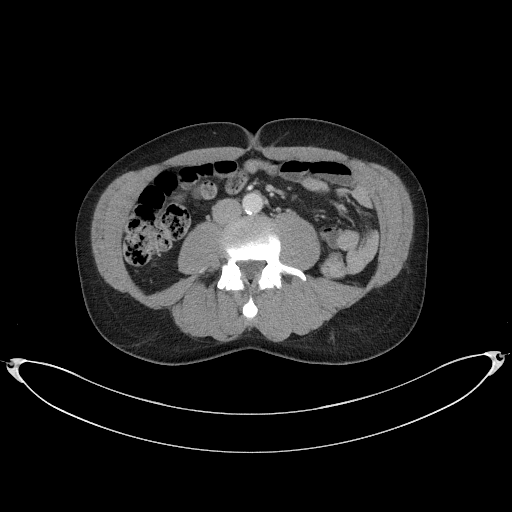
[im 57/98  soft-tissue]
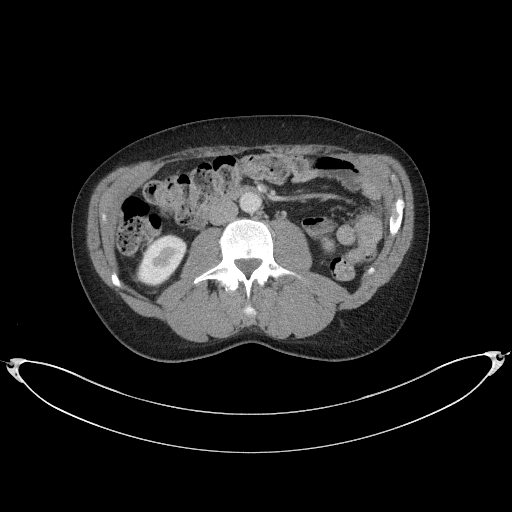
[im 62/98  soft-tissue]
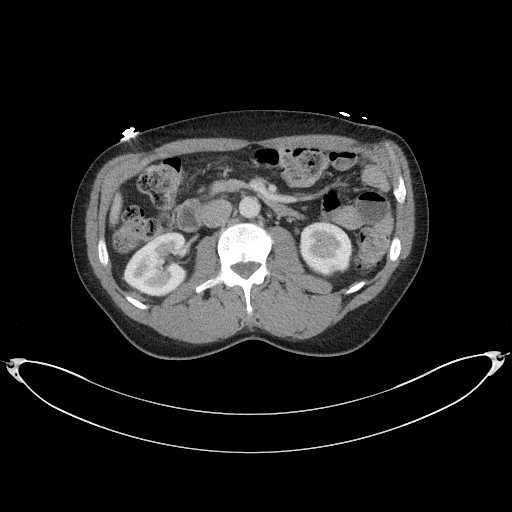
[im 62/98  bone]
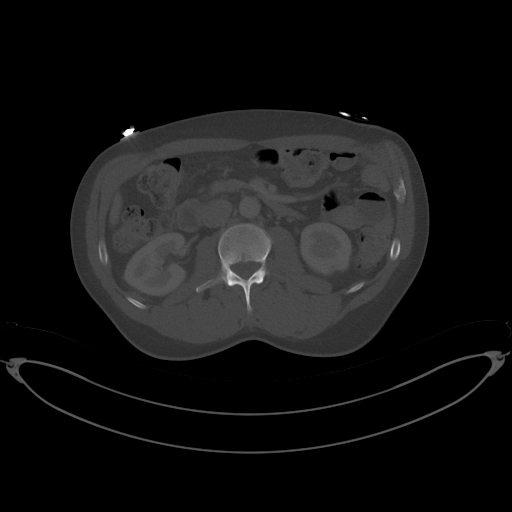
[im 72/98  soft-tissue]
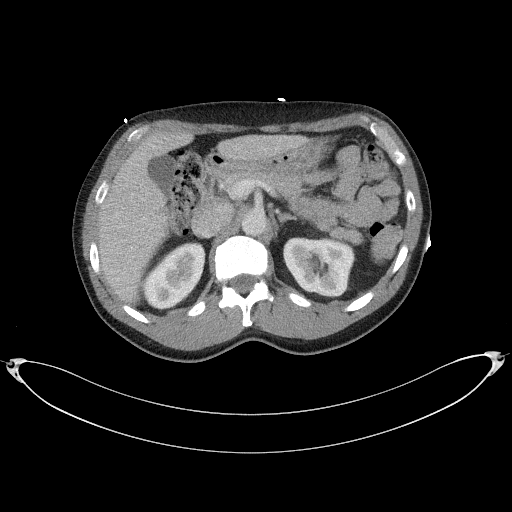
[im 77/98  soft-tissue]
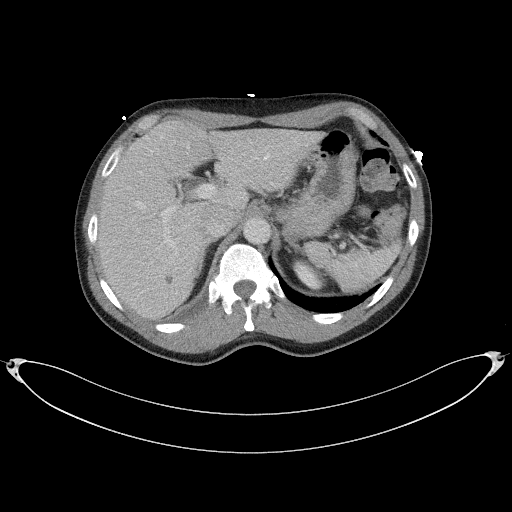
[im 82/98  soft-tissue]
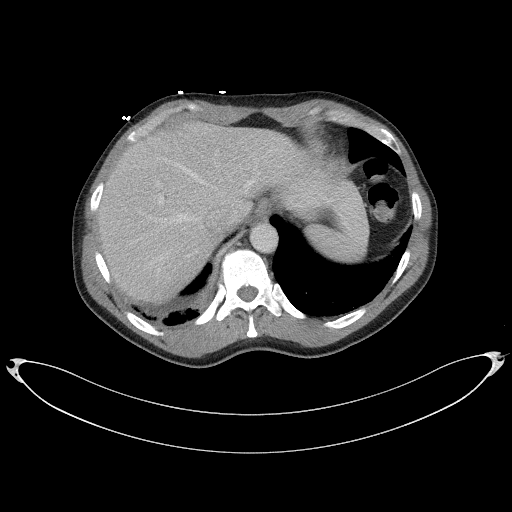
[im 92/98  soft-tissue]
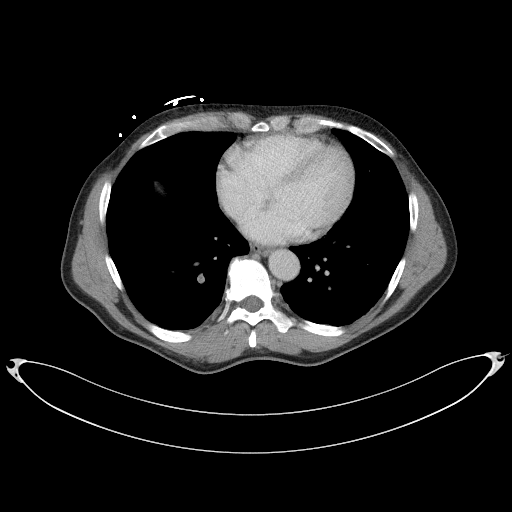

[Series 5: a/p w/ cor · coronal · 0.87mm/px · 3 of 150 slices shown]
[im 50/150  soft-tissue]
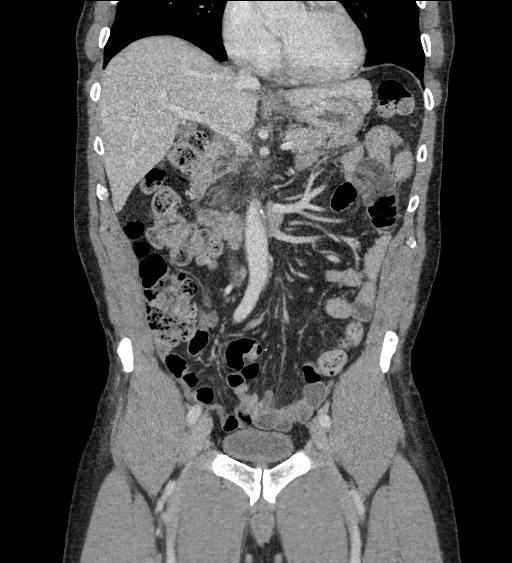
[im 67/150  soft-tissue]
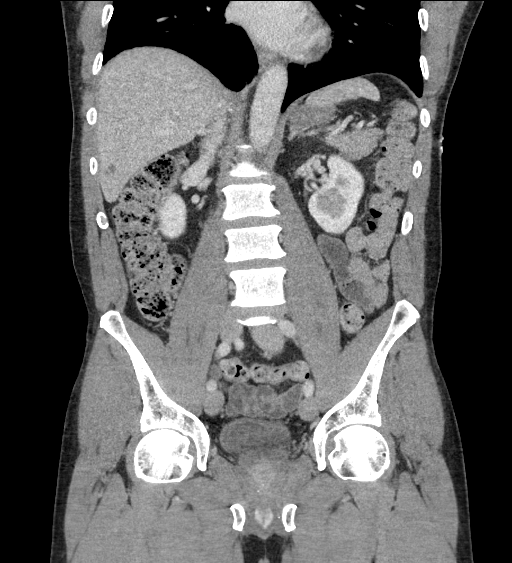
[im 83/150  soft-tissue]
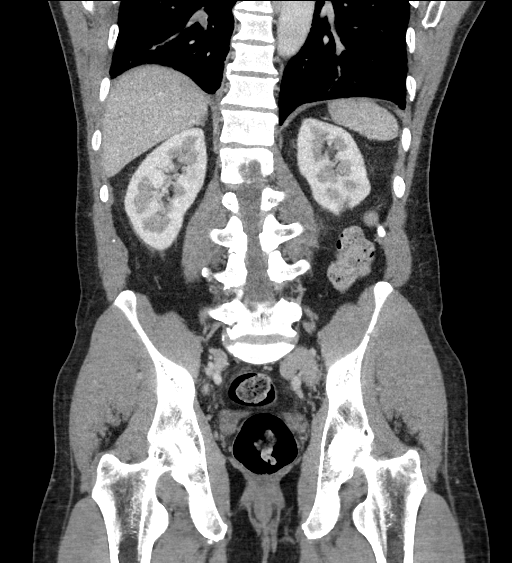

[16 of 46 positions shown; findings below may reference images not displayed]

FINDINGS: Lower chest: Opacity in the posterior aspect of the right lower lobe
with adjacent ground-glass attenuation concerning for
pneumonia/atelectasis. Left lung bases clear.

Hepatobiliary: No focal liver abnormality is seen. Stable
hypodensities in the right hepatic lobe, likely cyst or hemangioma.
No gallstones, gallbladder wall thickening, or biliary dilatation.

Pancreas: Unremarkable. No pancreatic ductal dilatation or
surrounding inflammatory changes.

Spleen: Normal in size without focal abnormality.

Adrenals/Urinary Tract: Adrenal glands are unremarkable. Kidneys are
normal, without renal calculi, focal lesion, or hydronephrosis.
Bladder is unremarkable.

Stomach/Bowel: Stomach is within normal limits. Appendix not
visualized, correlate with prior surgical history. No evidence of
bowel wall thickening, distention, or inflammatory changes.

Vascular/Lymphatic: Aortic atherosclerosis. No enlarged abdominal or
pelvic lymph nodes.

Reproductive: Prostate is mildly enlarged.

Other: No abdominal wall hernia or abnormality. No abdominopelvic
ascites.

Musculoskeletal: No acute or significant osseous findings. Mild
degenerate disc disease of the lumbar spine.
IMPRESSION: 1. Right lower lobe opacity with adjacent ground-glass attenuation
concerning for pneumonia/atelectasis. Follow-up examination to
resolution is recommended.

2.  Bowel loops are normal in caliber.

3.  No evidence of cholelithiasis or acute cholecystitis.

4.  No evidence of nephrolithiasis or hydronephrosis.

## 2023-05-18 ENCOUNTER — Ambulatory Visit (INDEPENDENT_AMBULATORY_CARE_PROVIDER_SITE_OTHER): Payer: Medicare HMO

## 2023-05-18 VITALS — BP 126/80 | Ht 71.0 in | Wt 192.0 lb

## 2023-05-18 DIAGNOSIS — Z0000 Encounter for general adult medical examination without abnormal findings: Secondary | ICD-10-CM | POA: Diagnosis not present

## 2023-05-18 DIAGNOSIS — Z1211 Encounter for screening for malignant neoplasm of colon: Secondary | ICD-10-CM | POA: Diagnosis not present

## 2023-05-18 NOTE — Patient Instructions (Addendum)
Joel Lopez , Thank you for taking time to come for your Medicare Wellness Visit. I appreciate your ongoing commitment to your health goals. Please review the following plan we discussed and let me know if I can assist you in the future.   Referrals/Orders/Follow-Ups/Clinician Recommendations: .  Colonoscopy [facility] [address] [address] [place],  [place]  [address] Main: [phone]   This is a list of the screening recommended for you and due dates:  Health Maintenance  Topic Date Due   COVID-19 Vaccine (6 - 2024-25 season) 12/17/2022   Colon Cancer Screening  01/25/2023   Medicare Annual Wellness Visit  05/17/2024   DTaP/Tdap/Td vaccine (4 - Td or Tdap) 11/11/2028   Pneumonia Vaccine  Completed   Flu Shot  Completed   Hepatitis C Screening  Completed   Zoster (Shingles) Vaccine  Completed   HPV Vaccine  Aged Out    Advanced directives: (Copy Requested) Please bring a copy of your health care power of attorney and living will to the office to be added to your chart at your convenience.  Next Medicare Annual Wellness Visit scheduled for next year: Yes 05/21/2023 @ 10:50am in person

## 2023-05-18 NOTE — Addendum Note (Signed)
Addended by: Sue Lush on: 05/18/2023 11:48 AM   Modules accepted: Orders, Level of Service

## 2023-05-18 NOTE — Progress Notes (Cosign Needed Addendum)
0  Subjective:   Joel Lopez is a 73 y.o. male who presents for Medicare Annual/Subsequent preventive examination.  Visit Complete: In person  Patient Medicare AWV questionnaire was completed by the patient on 05/11/2023; I have confirmed that all information answered by patient is correct and no changes since this date.  Cardiac Risk Factors include: advanced age (>27men, >36 women);male gender    Objective:    Today's Vitals   05/18/23 1103  BP: 126/80  Weight: 192 lb (87.1 kg)  Height: 5\' 11"  (1.803 m)   Body mass index is 26.78 kg/m.     05/18/2023   11:14 AM 09/02/2021    4:00 PM 08/12/2019    9:50 AM 04/26/2018    9:31 AM 04/23/2018    8:35 AM  Advanced Directives  Does Patient Have a Medical Advance Directive? Yes Yes No Yes Yes  Type of Estate agent of [place];Living will Healthcare Power of [place];Living will  Healthcare Power of [place];Living will Healthcare Power of [place];Living will  Does patient want to make changes to medical advance directive?     No - Patient declined  Copy of Healthcare Power of Attorney in Chart? No - copy requested No - copy requested  No - copy requested No - copy requested  Would patient like information on creating a medical advance directive?   No - Patient declined      Current Medications (verified) Outpatient Encounter Medications as of 05/18/2023  Medication Sig   aspirin EC 81 MG tablet Take 81 mg by mouth daily. Swallow whole.   dexamethasone 0.5 MG/5ML elixir Take by mouth. (Patient not taking: Reported on 09/25/2022)   Multiple Vitamin (MULTIVITAMIN) tablet Take 1 tablet by mouth daily.   nystatin (MYCOSTATIN/NYSTOP) powder Apply 1 Application topically 2 (two) times daily.   No facility-administered encounter medications on file as of 05/18/2023.    Allergies (verified) Patient has no known allergies.   History: Past Medical History:  Diagnosis Date   Anemia of other chronic disease     normal blood blood counts since 2007   Arthritis    GERD (gastroesophageal reflux disease)    Hamartomatous polyp of large intestine (HCC)    Hemorrhoids    History of DVT in adulthood    Ileus (HCC)    Internal hemorrhoids    Labyrinthitis    Obstructive sleep apnea    study '07 - AHI 17, lowest O2 sat 86%. not using CPAP, couldn't tolerate CPAP   Plantar fasciitis    Right rotator cuff tear 2013   90% normal function.   Rupture long head biceps tendon 2014   right. 90% normal function   Past Surgical History:  Procedure Laterality Date   DENTAL SURGERY     implant   INGUINAL HERNIA REPAIR Left 04/26/2018   Procedure: LEFT INGUINAL HERNIA REPAIR WITH MESH;  Surgeon: Griselda Miner, MD;  Location: [facility] OR;  Service: General;  Laterality: Left;   INSERTION OF MESH Left 04/26/2018   Procedure: INSERTION OF MESH;  Surgeon: Griselda Miner, MD;  Location: [facility] OR;  Service: General;  Laterality: Left;   LAPAROSCOPIC APPENDECTOMY N/A 08/12/2019   Procedure: APPENDECTOMY LAPAROSCOPIC;  Surgeon: Manus Rudd, MD;  Location: [facility] OR;  Service: General;  Laterality: N/A;   ORIF ULNAR FRACTURE Left    had screws and plate; had screws and plate removed [id number]   Family History  Problem Relation Age of Onset   Colon cancer Maternal Aunt  Esophageal cancer Maternal Uncle    Leukemia Mother    Diabetes Neg Hx    Heart disease Neg Hx    Hyperlipidemia Neg Hx    Stroke Neg Hx    Prostate cancer Neg Hx    Rectal cancer Neg Hx    Stomach cancer Neg Hx    Social History   Socioeconomic History   Marital status: Divorced    Spouse name: Not on file   Number of children: 3   Years of education: 12   Highest education level: 12th grade  Occupational History   Occupation: METER TECH    Employer: [employer]  Tobacco Use   Smoking status: Never   Smokeless tobacco: Never  Vaping Use   Vaping status: Never Used  Substance and Sexual Activity   Alcohol use: Yes    Alcohol/week: 0.0  standard drinks of alcohol    Comment: social   Drug use: No   Sexual activity: Yes    Partners: Female  Other Topics Concern   Not on file  Social History Narrative   [employer]    Occupation: Health and safety inspector - metering. Married '72-'87, divorced; '96-00, divorced.    Lives alone.    Pt has children: 2 sons - '72, '80; 1 dtr -'76; 9 grandchildren, 1 great grandchild   Regular Exercise- yes, tennis 3 x a week. Resistance training. Discussed ACP and referred to [email or web address] for his consideration (Dec '14)    Enjoys playing tennis.    Social Drivers of Corporate investment banker Strain: Low Risk  (05/18/2023)   Overall Financial Resource Strain (CARDIA)    Difficulty of Paying Living Expenses: Not hard at all  Food Insecurity: No Food Insecurity (05/18/2023)   Hunger Vital Sign    Worried About Running Out of Food in the Last Year: Never true    Ran Out of Food in the Last Year: Never true  Transportation Needs: No Transportation Needs (05/18/2023)   PRAPARE - Administrator, Civil Service (Medical): No    Lack of Transportation (Non-Medical): No  Physical Activity: Sufficiently Active (05/18/2023)   Exercise Vital Sign    Days of Exercise per Week: 4 days    Minutes of Exercise per Session: 100 min  Stress: No Stress Concern Present (05/18/2023)   [employer] of Occupational Health - Occupational Stress Questionnaire    Feeling of Stress : Not at all  Social Connections: Moderately Isolated (05/18/2023)   Social Connection and Isolation Panel [NHANES]    Frequency of Communication with Friends and Family: More than three times a week    Frequency of Social Gatherings with Friends and Family: Twice a week    Attends Religious Services: Never    Database administrator or Organizations: Yes    Attends Engineer, structural: More than 4 times per year    Marital Status: Divorced    Tobacco Counseling Counseling given: Not  Answered  Clinical Intake:  Pre-visit preparation completed: Yes  Pain : No/denies pain   BMI - recorded: 26.78 Nutritional Status: BMI 25 -29 Overweight Nutritional Risks: Nausea/ vomitting/ diarrhea (nausea getting up in am?unbalanced in am) Diabetes: No  How often do you need to have someone help you when you read instructions, pamphlets, or other written materials from your doctor or pharmacy?: 1 - Never  Interpreter Needed?: No  Comments: lives alone Information entered by :: B.Karyme Mcconathy,LPN  Activities of Daily Living    05/11/2023  7:05 PM  In your present state of health, do you have any difficulty performing the following activities:  Hearing? 0  Vision? 0  Difficulty concentrating or making decisions? 0  Walking or climbing stairs? 0  Dressing or bathing? 0  Doing errands, shopping? 0  Preparing Food and eating ? N  Using the Toilet? N  In the past six months, have you accidently leaked urine? N  Do you have problems with loss of bowel control? N  Managing your Medications? N  Managing your Finances? N  Housekeeping or managing your Housekeeping? N    Patient Care Team: Joaquim Nam, MD as PCP - General (Family Medicine) Enid Baas, MD (Family Medicine) Antonietta Barcelona, OD (Optometry)  Indicate any recent Medical Services you may have received from other than [facility] providers in the past year (date may be approximate).     Assessment:   This is a routine wellness examination for [employer].  Hearing/Vision screen Hearing Screening - Comments:: Pt says his hearing is good Vision Screening - Comments:: Pt says his passing with glasses; vision not clear due to cataract Dr Jimmye Norman   Goals Addressed             This Visit's Progress    Patient Stated   Not on track    05/18/23-want to have more energy       Depression Screen    05/18/2023   11:10 AM 09/25/2022    9:35 AM 05/26/2022   12:43 PM 09/02/2021    4:02 PM 03/29/2020     2:11 PM 05/25/2017    9:27 AM  PHQ 2/9 Scores  PHQ - 2 Score 0 0 0 0 0 0  PHQ- 9 Score  0 0       Fall Risk    05/11/2023    7:05 PM 09/25/2022    9:35 AM 05/26/2022   12:43 PM 09/02/2021    4:01 PM 09/01/2021    8:02 PM  Fall Risk   Falls in the past year? 0 0 0 1 0  Comment    playing tennis   Number falls in past yr: 0 0 0 1 1  Injury with Fall? 0 0 0 1 1  Comment    dislocated finger   Risk for fall due to : No Fall Risks No Fall Risks No Fall Risks Medication side effect   Follow up Falls prevention discussed;Education provided Falls evaluation completed Falls evaluation completed Falls evaluation completed;Education provided;Falls prevention discussed     MEDICARE RISK AT HOME: Medicare Risk at Home Any stairs in or around the home?: (Patient-Rptd) No If so, are there any without handrails?: (Patient-Rptd) No Home free of loose throw rugs in walkways, pet beds, electrical cords, etc?: (Patient-Rptd) Yes Adequate lighting in your home to reduce risk of falls?: (Patient-Rptd) Yes Life alert?: (Patient-Rptd) No Use of a cane, walker or w/c?: (Patient-Rptd) No Grab bars in the bathroom?: (Patient-Rptd) No Shower chair or bench in shower?: (Patient-Rptd) No Elevated toilet seat or a handicapped toilet?: (Patient-Rptd) No  TIMED UP AND GO:  Was the test performed?  Yes  Length of time to ambulate 10 feet: 8 sec Gait steady and fast without use of assistive device    Cognitive Function:        05/18/2023   11:21 AM 09/02/2021    4:03 PM  6CIT Screen  What Year? 0 points 0 points  What month? 0 points 0 points  What time? 0 points  0 points  Count back from 20 0 points 0 points  Months in reverse 0 points 0 points  Repeat phrase 0 points 0 points  Total Score 0 points 0 points    Immunizations Immunization History  Administered Date(s) Administered   DTP 12/03/2008   Fluad Quad(high Dose 65+) 01/23/2019, 01/29/2020, 03/22/2023   Influenza, High Dose Seasonal PF  04/04/2022   Influenza,inj,Quad PF,6+ Mos 05/25/2017, 02/15/2018   Influenza-Unspecified 01/26/2021   PFIZER(Purple Top)SARS-COV-2 Vaccination 05/30/2019, 06/23/2019, 01/22/2020, 01/28/2021   Pfizer(Comirnaty)Fall Seasonal Vaccine 12 years and older 04/04/2022   Pneumococcal Conjugate-13 01/29/2020   Pneumococcal Polysaccharide-23 11/12/2018   Td 12/03/2008   Tdap 11/12/2018   Zoster Recombinant(Shingrix) 03/29/2020, 06/02/2020    TDAP status: Up to date  Flu Vaccine status: Up to date pt [facility] dec [facility]  Pneumococcal vaccine status: Up to date  Covid-19 vaccine status: Completed vaccines pt [facility] dec 24 at [facility]  Qualifies for Shingles Vaccine? Yes   Zostavax completed Yes   Shingrix Completed?: Yes  Screening Tests Health Maintenance  Topic Date Due   COVID-19 Vaccine (6 - 2024-25 season) 12/17/2022   Colonoscopy  01/25/2023   Medicare Annual Wellness (AWV)  05/17/2024   DTaP/Tdap/Td (4 - Td or Tdap) 11/11/2028   Pneumonia Vaccine 107+ Years old  Completed   INFLUENZA VACCINE  Completed   Hepatitis C Screening  Completed   Zoster Vaccines- Shingrix  Completed   HPV VACCINES  Aged Out    Health Maintenance  Health Maintenance Due  Topic Date Due   COVID-19 Vaccine (6 - 2024-25 season) 12/17/2022   Colonoscopy  01/25/2023    Colorectal cancer screening: Referral to GI placed yes. Pt aware the office will call re: appt.  Lung Cancer Screening: (Low Dose CT Chest recommended if Age 42-80 years, 20 pack-year currently smoking OR have quit w/in 15years.) does not qualify.   Lung Cancer Screening Referral: no  Additional Screening:  Hepatitis C Screening: does not qualify; Completed 01/14/2015  Vision Screening: Recommended annual ophthalmology exams for early detection of glaucoma and other disorders of the eye. Is the patient up to date with their annual eye exam?  Yes  Who is the provider or what is the name of the office in which the patient attends annual eye  exams? Dr Hyacinth Meeker If pt is not established with a provider, would they like to be referred to a provider to establish care? No .   Dental Screening: Recommended annual dental exams for proper oral hygiene  Diabetic Foot Exam: n/a  Community Resource Referral / Chronic Care Management: CRR required this visit?  No   CCM required this visit?  Appt scheduled with PCP    Plan:     I have personally reviewed and noted the following in the patient's chart:   Medical and social history Use of alcohol, tobacco or illicit drugs  Current medications and supplements including opioid prescriptions. Patient is not currently taking opioid prescriptions. Functional ability and status Nutritional status Physical activity Advanced directives List of other physicians Hospitalizations, surgeries, and ER visits in previous 12 months Vitals Screenings to include cognitive, depression, and falls Referrals and appointments  In addition, I have reviewed and discussed with patient certain preventive protocols, quality metrics, and best practice recommendations. A written personalized care plan for preventive services as well as general preventive health recommendations were provided to patient.    Sue Lush, LPN   1/32/4401   After Visit Summary: (Declined) Due to this being  a telephonic visit, with patients personalized plan was offered to patient but patient Declined AVS at this time   Nurse Notes: The patient states he is doing alright. He does report being nauseous and balance off first thing in am after getting up but goes away as day goes on. He has been experiencing for about a month now.He has no other concerns or questions at this time.

## 2023-05-20 ENCOUNTER — Telehealth: Payer: Self-pay | Admitting: Family Medicine

## 2023-05-20 NOTE — Telephone Encounter (Signed)
Please check with patient about nausea and balance changes in the AM.  He has f/u pending for the end of the month. See if this needs to be moved sooner.  Thanks.

## 2023-05-21 NOTE — Telephone Encounter (Signed)
 Noted. Thanks.

## 2023-05-21 NOTE — Telephone Encounter (Signed)
Patient states the last couple days has been fairly decent. Patient states it is not fully resolved but better than a couple week ago. Patient would like to keep appt as scheduled. Will call back if anything changes.

## 2023-06-11 ENCOUNTER — Encounter: Payer: Self-pay | Admitting: Family Medicine

## 2023-06-11 ENCOUNTER — Ambulatory Visit (INDEPENDENT_AMBULATORY_CARE_PROVIDER_SITE_OTHER): Payer: Medicare HMO | Admitting: Family Medicine

## 2023-06-11 VITALS — BP 122/68 | HR 56 | Temp 98.1°F | Ht 71.0 in | Wt 195.4 lb

## 2023-06-11 DIAGNOSIS — Z7189 Other specified counseling: Secondary | ICD-10-CM

## 2023-06-11 DIAGNOSIS — Z125 Encounter for screening for malignant neoplasm of prostate: Secondary | ICD-10-CM | POA: Diagnosis not present

## 2023-06-11 DIAGNOSIS — Z86718 Personal history of other venous thrombosis and embolism: Secondary | ICD-10-CM

## 2023-06-11 DIAGNOSIS — E785 Hyperlipidemia, unspecified: Secondary | ICD-10-CM

## 2023-06-11 DIAGNOSIS — Z0000 Encounter for general adult medical examination without abnormal findings: Secondary | ICD-10-CM

## 2023-06-11 LAB — COMPREHENSIVE METABOLIC PANEL
ALT: 17 U/L (ref 0–53)
AST: 19 U/L (ref 0–37)
Albumin: 4.2 g/dL (ref 3.5–5.2)
Alkaline Phosphatase: 72 U/L (ref 39–117)
BUN: 17 mg/dL (ref 6–23)
CO2: 27 meq/L (ref 19–32)
Calcium: 9.5 mg/dL (ref 8.4–10.5)
Chloride: 107 meq/L (ref 96–112)
Creatinine, Ser: 0.95 mg/dL (ref 0.40–1.50)
GFR: 80.96 mL/min (ref >60.00)
Glucose, Bld: 88 mg/dL (ref 70–99)
Potassium: 5.1 meq/L (ref 3.5–5.1)
Sodium: 142 meq/L (ref 135–145)
Total Bilirubin: 0.8 mg/dL (ref 0.2–1.2)
Total Protein: 6.9 g/dL (ref 6.0–8.3)

## 2023-06-11 LAB — CBC
HCT: 41.1 % (ref 39.0–52.0)
Hemoglobin: 13.2 g/dL (ref 13.0–17.0)
MCHC: 32.2 g/dL (ref 30.0–36.0)
MCV: 87.2 fl (ref 78.0–100.0)
Platelets: 245 10*3/uL (ref 150.0–400.0)
RBC: 4.71 Mil/uL (ref 4.22–5.81)
RDW: 14.7 % (ref 11.5–15.5)
WBC: 4 10*3/uL (ref 4.0–10.5)

## 2023-06-11 LAB — LIPID PANEL
Cholesterol: 233 mg/dL — ABNORMAL HIGH (ref 0–200)
HDL: 62.5 mg/dL (ref >39.00)
LDL Cholesterol: 161 mg/dL — ABNORMAL HIGH (ref 0–99)
NonHDL: 170.45
Total CHOL/HDL Ratio: 4
Triglycerides: 47 mg/dL (ref 0.0–149.0)
VLDL: 9.4 mg/dL (ref 0.0–40.0)

## 2023-06-11 LAB — PSA, MEDICARE: PSA: 1.03 ng/mL (ref 0.10–4.00)

## 2023-06-11 NOTE — Patient Instructions (Addendum)
 Go to the lab on the way out.   If you have mychart we'll likely use that to update you.    ?Take care.  Glad to see you. ?Thanks for your effort.  ?Update me as needed.  ?

## 2023-06-11 NOTE — Progress Notes (Unsigned)
 Prev off balance sensation when getting out of bed resolved.  No sx o/w.  Orthostatic cautions d/w pt. No vertigo.    H/o DVT.  Still on aspirin.  No swelling or bleeding.    No recent paresthesias.    He can feel heart rate increase when he sees something upsetting on TV.  D/w pt.   No CP.  No syncope.    Flu 2024 Shingles previously done PNA previously done Tetanus 2020 COVID-vaccine previously done Colonoscopy pending 2025 Prostate cancer screening 2025 Living will d/w pt. Would have his son Joel Lopez designated if patient were incapacitated.   Meds, vitals, and allergies reviewed.   ROS: Per HPI unless specifically indicated in ROS section   GEN: nad, alert and oriented HEENT: mucous membranes moist NECK: supple w/o LA CV: rrr.  no murmur PULM: ctab, no inc wob ABD: soft, +bs EXT: no edema SKIN: no acute rash

## 2023-06-13 DIAGNOSIS — E785 Hyperlipidemia, unspecified: Secondary | ICD-10-CM | POA: Insufficient documentation

## 2023-06-13 DIAGNOSIS — Z0000 Encounter for general adult medical examination without abnormal findings: Secondary | ICD-10-CM | POA: Insufficient documentation

## 2023-06-13 NOTE — Assessment & Plan Note (Signed)
 H/o DVT.  Still on aspirin.  No swelling or bleeding.  Would continue as is.

## 2023-06-13 NOTE — Assessment & Plan Note (Signed)
 Living will d/w pt. Would have his son Joel Lopez designated if patient were incapacitated.

## 2023-06-13 NOTE — Assessment & Plan Note (Signed)
 Flu 2024 Shingles previously done PNA previously done Tetanus 2020 COVID-vaccine previously done Colonoscopy pending 2025 Prostate cancer screening 2025 Living will d/w pt. Would have his son Babatunde Seago designated if patient were incapacitated.

## 2023-06-13 NOTE — Assessment & Plan Note (Signed)
 Recheck labs pending.  See notes on labs. He has been working on diet and exercise.

## 2023-06-20 ENCOUNTER — Other Ambulatory Visit: Payer: Self-pay | Admitting: Family Medicine

## 2023-06-20 ENCOUNTER — Ambulatory Visit: Admitting: Family Medicine

## 2023-06-20 DIAGNOSIS — E785 Hyperlipidemia, unspecified: Secondary | ICD-10-CM

## 2023-06-20 MED ORDER — ATORVASTATIN CALCIUM 10 MG PO TABS: 10.0000 mg | ORAL_TABLET | Freq: Every day | ORAL | 3 refills | Status: AC

## 2023-06-27 ENCOUNTER — Telehealth: Payer: Self-pay | Admitting: Family Medicine

## 2023-06-27 NOTE — Telephone Encounter (Signed)
 Patient notified

## 2023-06-27 NOTE — Telephone Encounter (Signed)
 At night.  Thanks.

## 2023-06-27 NOTE — Telephone Encounter (Signed)
 Copied from CRM [id number]. Topic: Clinical - Medication Question >> Jun 27, 2023 11:53 AM Sim Boast F wrote: Reason for CRM: Patient wants to know if he should take the Atorvastatin medication in the morning or at night

## 2023-07-04 ENCOUNTER — Encounter: Payer: Self-pay | Admitting: Family Medicine

## 2023-07-04 ENCOUNTER — Ambulatory Visit: Admitting: Family Medicine

## 2023-07-04 ENCOUNTER — Other Ambulatory Visit: Payer: Self-pay

## 2023-07-04 VITALS — BP 144/84 | Ht 71.0 in | Wt 186.0 lb

## 2023-07-04 DIAGNOSIS — M25511 Pain in right shoulder: Secondary | ICD-10-CM

## 2023-07-04 DIAGNOSIS — S46011A Strain of muscle(s) and tendon(s) of the rotator cuff of right shoulder, initial encounter: Secondary | ICD-10-CM

## 2023-07-04 MED ORDER — NITROGLYCERIN 0.2 MG/HR TD PT24: MEDICATED_PATCH | TRANSDERMAL | 1 refills | Status: DC

## 2023-07-04 MED ORDER — NAPROXEN 500 MG PO TABS: 500.0000 mg | ORAL_TABLET | Freq: Two times a day (BID) | ORAL | 0 refills | Status: DC

## 2023-07-04 NOTE — Patient Instructions (Signed)

## 2023-07-04 NOTE — Progress Notes (Unsigned)
 Joel Lopez - 73 y.o. male MRN [id number]  Date of birth: 1950/12/07  PCP: Joaquim Nam, MD  Subjective:  No chief complaint on file. Right shoulder pain  HPI: Past Medical, Surgical, Social, and Family History Reviewed & Updated per EMR.   Patient is a 73 y.o. male here for pain in his right shoulder that occurred after a forceful forehead hit and tennis last week.  He felt immediate twinge in the anterior portion of his shoulder that was sharp, nonradiating, worsened with activity, and improved with rest.  He did have a similar injury in November of last year and he took 2 months off from tennis but saw minimal improvement.  He did not do any therapy at that time.  Since this most recent aggravation he has tried to restart tennis but has pain with his regular swings.  He denies any swelling, fever, chills, numbness or weakness distally.   Pertinent PMHx: Small rotator cuff tear 2014  Past Surgical History:  Procedure Laterality Date   DENTAL SURGERY     implant   INGUINAL HERNIA REPAIR Left 04/26/2018   Procedure: LEFT INGUINAL HERNIA REPAIR WITH MESH;  Surgeon: Griselda Miner, MD;  Location: [facility] OR;  Service: General;  Laterality: Left;   INSERTION OF MESH Left 04/26/2018   Procedure: INSERTION OF MESH;  Surgeon: Griselda Miner, MD;  Location: [facility] OR;  Service: General;  Laterality: Left;   LAPAROSCOPIC APPENDECTOMY N/A 08/12/2019   Procedure: APPENDECTOMY LAPAROSCOPIC;  Surgeon: Manus Rudd, MD;  Location: [facility] OR;  Service: General;  Laterality: N/A;   ORIF ULNAR FRACTURE Left    had screws and plate; had screws and plate removed 2130    No Known Allergies      Objective:  Physical Exam: VS: BP:(!) 144/84  HR: bpm  TEMP: ( )  RESP:   HT:5\' 11"  (180.3 cm)   WT:186 lb (84.4 kg)  BMI:25.95  Gen: Well developed, NAD, speaks clearly, comfortable in exam room Respiratory: Normal work of breathing on room air, no respiratory distress Skin: No rashes, abrasions, or  ecchymosis MSK:  Inspection of the right shoulder shows no abnormalities Range of motion is full in flexion, abduction, internal and external rotation Strength 5/5 in all directions Hawkins, empty can, speeds positive O'Brien's, Yergason's, crossover test negative Neuro: Neurovascularly intact distally  Ultrasound of right shoulder:  Biceps Tendon SAX and LAX: visualized in bicipital groove w/ no hypoechoic changes, pectoralis and subscapularis insertions in tact  Subscapularis tendon - viewed in SAX and LAX inserting into the inferior lesser tubercle of humerus. echogenics: hypoechoic changes surrounding the tendon  AC joint - no narrowing, w/ superior osteophyte formation, no giser sign Supraspinatus tendon - viewed in SAX and LAX, tendon in tact, insertion at superior facet of greater tubercle of the humerus w/ echogenics: hypoechoic changes within the tendon confirmed in both views Infraspinatus - viewed in LAX and SAX at the middle facet of the greater tubercle of humerus w/ echogenics: hypoechoic changes within the tendon confirmed in both views  Summary: partial right rotator cuff tear of supraspinatus, infraspinatus, and subscapularis  Ultrasound and interpretation by Dr. Webb Silversmith and Dr. Pearletha Forge      Assessment & Plan:   Incomplete tear of right rotator cuff - I discussed the patient's ultrasound findings along with the history and exam which are consistent with a right rotator cuff injury. - Ultrasound shows small partial tears of the distal supraspinatus, infraspinatus, and subscapularis. - The patient's symptoms are  tolerable with daily activities but he wants to go back to tennis as soon as possible.  We discussed resting for a week and starting physical therapy. - He responded well in the past to nitroglycerin patches.  We will start him on this regimen again. - He can also start ice over the area for 15-20 minutes after activity. - We also discussed the use of naproxen 500  mg twice daily for the next 2 weeks for decreased inflammation.  He has no history of hypertension, kidney disease, or gastric ulcers. -We will follow-up in 3 weeks to reevaluate.  If no improvement at that time we can discuss the use of shockwave therapy or injection.   Rica Mote MD [facility] Sports Medicine Fellow

## 2023-07-04 NOTE — Assessment & Plan Note (Signed)
-   I discussed the patient's ultrasound findings along with the history and exam which are consistent with a right rotator cuff injury. - Ultrasound shows small partial tears of the distal supraspinatus, infraspinatus, and subscapularis. - The patient's symptoms are tolerable with daily activities but he wants to go back to tennis as soon as possible.  We discussed resting for a week and starting physical therapy. - He responded well in the past to nitroglycerin patches.  We will start him on this regimen again. - He can also start ice over the area for 15-20 minutes after activity. - We also discussed the use of naproxen 500 mg twice daily for the next 2 weeks for decreased inflammation.  He has no history of hypertension, kidney disease, or gastric ulcers. -We will follow-up in 3 weeks to reevaluate.  If no improvement at that time we can discuss the use of shockwave therapy or injection.

## 2023-07-26 DIAGNOSIS — M25511 Pain in right shoulder: Secondary | ICD-10-CM | POA: Diagnosis not present

## 2023-07-31 DIAGNOSIS — M25511 Pain in right shoulder: Secondary | ICD-10-CM | POA: Diagnosis not present

## 2023-08-07 DIAGNOSIS — M25511 Pain in right shoulder: Secondary | ICD-10-CM | POA: Diagnosis not present

## 2023-08-14 DIAGNOSIS — M25511 Pain in right shoulder: Secondary | ICD-10-CM | POA: Diagnosis not present

## 2023-08-15 ENCOUNTER — Other Ambulatory Visit: Payer: Self-pay

## 2023-08-15 ENCOUNTER — Encounter: Payer: Self-pay | Admitting: Family Medicine

## 2023-08-15 DIAGNOSIS — M25511 Pain in right shoulder: Secondary | ICD-10-CM

## 2023-08-15 DIAGNOSIS — S46011A Strain of muscle(s) and tendon(s) of the rotator cuff of right shoulder, initial encounter: Secondary | ICD-10-CM

## 2023-08-15 NOTE — Progress Notes (Signed)
 Pt called asking for MRI of right shoulder. Ok to order per Dr. Dannis Dy.

## 2023-08-22 ENCOUNTER — Ambulatory Visit
Admission: RE | Admit: 2023-08-22 | Discharge: 2023-08-22 | Discharge status: Home / Self Care | Source: Ambulatory Visit | Attending: Family Medicine

## 2023-08-22 DIAGNOSIS — M25511 Pain in right shoulder: Secondary | ICD-10-CM | POA: Diagnosis not present

## 2023-08-22 DIAGNOSIS — S46011A Strain of muscle(s) and tendon(s) of the rotator cuff of right shoulder, initial encounter: Secondary | ICD-10-CM | POA: Diagnosis not present

## 2023-08-22 DIAGNOSIS — M19011 Primary osteoarthritis, right shoulder: Secondary | ICD-10-CM | POA: Diagnosis not present

## 2023-09-05 ENCOUNTER — Encounter: Payer: Self-pay | Admitting: Family Medicine

## 2023-09-05 ENCOUNTER — Ambulatory Visit (INDEPENDENT_AMBULATORY_CARE_PROVIDER_SITE_OTHER): Admitting: Family Medicine

## 2023-09-05 VITALS — BP 138/88 | Ht 71.0 in | Wt 186.0 lb

## 2023-09-05 DIAGNOSIS — S46011A Strain of muscle(s) and tendon(s) of the rotator cuff of right shoulder, initial encounter: Secondary | ICD-10-CM

## 2023-09-05 MED ORDER — METHYLPREDNISOLONE ACETATE 40 MG/ML IJ SUSP
40.0000 mg | Freq: Once | INTRAMUSCULAR | Status: AC
  Administered 2023-09-05: 40 mg via INTRA_ARTICULAR

## 2023-09-05 NOTE — Assessment & Plan Note (Signed)
-   I reviewed the patient's MRI results with them as well as the treatment options. He is a very active individual and overall very healthy. I discussed the extent of his tear and possibility of referral for arthroscopic surgery vs conservative treatment. He has not been able to do the home exercises because of pain.  - After joint decision making, the patient would like to proceed with injection therapy as below as well as formal PT in order to guide his progression.  - We will follow up in 4 weeks. If not improvement at that point we can consider nitroglycerine patches or ECSWT as an adjunct to therapy. If worsened we can send in a referral to orthopedics.   Right Subacromial Injection Procedure: After informed written consent timeout was performed, patient was in seated position on exam table.  The right shoulder was prepped with alcohol swab x2. Ethyl chloride spray used for topical anesthetic. Utilizing the posterior approach and a 25g needle, the right subacromial bursa was injected with 3:1 lidocaine :depomedrol.  Following the injection a bandage was applied to the area. Patient tolerated procedure well without immediate complications. The patient was counseled as to the expected post-injection course, including the possibility of worsening of pain with steroid flare. Instructed as to concerning symptoms and advised to contact the office if these should arise.

## 2023-09-05 NOTE — Progress Notes (Unsigned)
 Joel Lopez - 73 y.o. male MRN [id number]  Date of birth: Aug 29, 1950  PCP: Donnie Galea, MD  Subjective:  No chief complaint on file.  Right rotator cuff tear  HPI: Past Medical, Surgical, Social, and Family History Reviewed & Updated per EMR.   Patient is a 73 y.o. male here for follow up on a right rotator cuff partial tear. He attempted home exercises but was unable to perform them due to pain. He requested an MRI which was performed and confirmed tears of the supraspinatus, infraspinatus, and subscapularis. His pain has not increased and he denies any numbness or weakness but would like to discuss treatment options.   Past Medical History:  Diagnosis Date   Anemia of other chronic disease    normal blood blood counts since 2007   Arthritis    GERD (gastroesophageal reflux disease)    Hamartomatous polyp of large intestine (HCC)    Hemorrhoids    History of DVT in adulthood    Ileus (HCC)    Internal hemorrhoids    Labyrinthitis    Obstructive sleep apnea    study '07 - AHI 17, lowest O2 sat 86%. not using CPAP, couldn't tolerate CPAP   Plantar fasciitis    Right rotator cuff tear 2013   90% normal function.   Rupture long head biceps tendon 2014   right. 90% normal function    Current Outpatient Medications on File Prior to Visit  Medication Sig Dispense Refill   aspirin EC 81 MG tablet Take 81 mg by mouth daily. Swallow whole.     atorvastatin  (LIPITOR) 10 MG tablet Take 1 tablet (10 mg total) by mouth daily. 90 tablet 3   Multiple Vitamin (MULTIVITAMIN) tablet Take 1 tablet by mouth daily.     naproxen  (NAPROSYN ) 500 MG tablet Take 1 tablet (500 mg total) by mouth 2 (two) times daily with a meal. 30 tablet 0   nitroGLYCERIN  (NITRODUR - DOSED IN MG/24 HR) 0.2 mg/hr patch Use 1/4 patch daily to the affected area. 30 patch 1   No current facility-administered medications on file prior to visit.    Past Surgical History:  Procedure Laterality Date   DENTAL  SURGERY     implant   INGUINAL HERNIA REPAIR Left 04/26/2018   Procedure: LEFT INGUINAL HERNIA REPAIR WITH MESH;  Surgeon: Caralyn Chandler, MD;  Location: [facility] OR;  Service: General;  Laterality: Left;   INSERTION OF MESH Left 04/26/2018   Procedure: INSERTION OF MESH;  Surgeon: Caralyn Chandler, MD;  Location: [facility] OR;  Service: General;  Laterality: Left;   LAPAROSCOPIC APPENDECTOMY N/A 08/12/2019   Procedure: APPENDECTOMY LAPAROSCOPIC;  Surgeon: Dareen Ebbing, MD;  Location: [facility] OR;  Service: General;  Laterality: N/A;   ORIF ULNAR FRACTURE Left    had screws and plate; had screws and plate removed 7829    No Known Allergies      Objective:  Physical Exam: VS: BP:138/88  HR: bpm  TEMP: ( )  RESP:   HT:5\' 11"  (180.3 cm)   WT:186 lb (84.4 kg)  BMI:25.95  Gen: NAD, speaks clearly, comfortable in exam room Respiratory: Normal respiratory effort on room air. No signs of distress Skin: No rashes, abrasions, or ecchymosis MSK:  Right Shoulder: Inspection mild defect in the biceps muscle but no other abnormalities ROM: full Strength: 5/5 in all directions AC joint: NT Special tests: positive empty can and speeds Negative O'brien, hawkin and neers    Assessment & Plan:  Incomplete tear of right rotator cuff - I reviewed the patient's MRI results with them as well as the treatment options. He is a very active individual and overall very healthy. I discussed the extent of his tear and possibility of referral for arthroscopic surgery vs conservative treatment. He has not been able to do the home exercises because of pain.  - After joint decision making, the patient would like to proceed with injection therapy as below as well as formal PT in order to guide his progression.  - We will follow up in 4 weeks. If not improvement at that point we can consider nitroglycerine patches or ECSWT as an adjunct to therapy. If worsened we can send in a referral to orthopedics.   Right Subacromial  Injection Procedure: After informed written consent timeout was performed, patient was in seated position on exam table.  The right shoulder was prepped with alcohol swab x2. Ethyl chloride spray used for topical anesthetic. Utilizing the posterior approach and a 25g needle, the right subacromial bursa was injected with 3:1 lidocaine :depomedrol.  Following the injection a bandage was applied to the area. Patient tolerated procedure well without immediate complications. The patient was counseled as to the expected post-injection course, including the possibility of worsening of pain with steroid flare. Instructed as to concerning symptoms and advised to contact the office if these should arise.     Berneda Bridges MD [facility] Sports Medicine Fellow

## 2023-09-14 DIAGNOSIS — M25511 Pain in right shoulder: Secondary | ICD-10-CM | POA: Diagnosis not present

## 2023-09-27 ENCOUNTER — Other Ambulatory Visit (INDEPENDENT_AMBULATORY_CARE_PROVIDER_SITE_OTHER)

## 2023-09-27 DIAGNOSIS — E785 Hyperlipidemia, unspecified: Secondary | ICD-10-CM | POA: Diagnosis not present

## 2023-09-27 LAB — LIPID PANEL
Cholesterol: 146 mg/dL (ref <200)
HDL: 55 mg/dL (ref >=40)
LDL Cholesterol (Calc): 77 mg/dL
Non-HDL Cholesterol (Calc): 91 mg/dL (ref <130)
Total CHOL/HDL Ratio: 2.7 (calc) (ref <5.0)
Triglycerides: 48 mg/dL (ref <150)

## 2023-09-27 NOTE — Addendum Note (Signed)
 Addended by: Gerry Krone on: 09/27/2023 08:36 AM   Modules accepted: Orders

## 2023-09-30 ENCOUNTER — Ambulatory Visit: Payer: Self-pay | Admitting: Family Medicine

## 2023-10-03 ENCOUNTER — Encounter: Payer: Self-pay | Admitting: Family Medicine

## 2023-10-03 ENCOUNTER — Ambulatory Visit: Admitting: Family Medicine

## 2023-10-03 VITALS — BP 142/76 | Ht 71.0 in | Wt 188.0 lb

## 2023-10-03 DIAGNOSIS — S46011D Strain of muscle(s) and tendon(s) of the rotator cuff of right shoulder, subsequent encounter: Secondary | ICD-10-CM | POA: Diagnosis not present

## 2023-10-03 NOTE — Assessment & Plan Note (Addendum)
-   Joel Lopez is progressing well with full range of motion and no pain but still has some mild weakness in shoulder flexion and abduction.  However, this is still improved from his previous visit. - We did discuss the addition of scapular retraction exercises for stabilization of the rotator cuff with overhead movement. - He will avoid further overhead playing tennis until 2 weeks.  At that point he can try overhead movement with little to no resistance.  If he does not have any symptoms he can start with light repetition movements but I would not recommend competition. - We will follow-up in 4 weeks to reevaluate and reultrasound.

## 2023-10-03 NOTE — Progress Notes (Signed)
 Joel Lopez - 73 y.o. male MRN [id number]  Date of birth: Jun 04, 1950  PCP: Joel Galea, MD  Subjective:  No chief complaint on file. Right partial rotator cuff tear  HPI: Past Medical, Surgical, Social, and Family History Reviewed & Updated per EMR.   Patient is a 73 y.o. male here for follow up evaluation of his right shoulder.  He has been doing the home exercises with increased range of motion and strength however he does still feel that his flexion has some weakness.  He does not have any more pain.  He did play some tennis last week with mostly below the shoulder movements.  He did try several over the head hits which caused him some discomfort.  Otherwise he has been progressing well.  Past Medical History:  Diagnosis Date   Anemia of other chronic disease    normal blood blood counts since 2007   Arthritis    GERD (gastroesophageal reflux disease)    Hamartomatous polyp of large intestine (HCC)    Hemorrhoids    History of DVT in adulthood    Ileus (HCC)    Internal hemorrhoids    Labyrinthitis    Obstructive sleep apnea    study '07 - AHI 17, lowest O2 sat 86%. not using CPAP, couldn't tolerate CPAP   Plantar fasciitis    Right rotator cuff tear 2013   90% normal function.   Rupture long head biceps tendon 2014   right. 90% normal function    Current Outpatient Medications on File Prior to Visit  Medication Sig Dispense Refill   aspirin EC 81 MG tablet Take 81 mg by mouth daily. Swallow whole.     atorvastatin  (LIPITOR) 10 MG tablet Take 1 tablet (10 mg total) by mouth daily. 90 tablet 3   Multiple Vitamin (MULTIVITAMIN) tablet Take 1 tablet by mouth daily.     naproxen  (NAPROSYN ) 500 MG tablet Take 1 tablet (500 mg total) by mouth 2 (two) times daily with a meal. 30 tablet 0   nitroGLYCERIN  (NITRODUR - DOSED IN MG/24 HR) 0.2 mg/hr patch Use 1/4 patch daily to the affected area. 30 patch 1   No current facility-administered medications on file prior to visit.     Past Surgical History:  Procedure Laterality Date   DENTAL SURGERY     implant   INGUINAL HERNIA REPAIR Left 04/26/2018   Procedure: LEFT INGUINAL HERNIA REPAIR WITH MESH;  Surgeon: Joel Chandler, MD;  Location: [facility] OR;  Service: General;  Laterality: Left;   INSERTION OF MESH Left 04/26/2018   Procedure: INSERTION OF MESH;  Surgeon: Joel Chandler, MD;  Location: [facility] OR;  Service: General;  Laterality: Left;   LAPAROSCOPIC APPENDECTOMY N/A 08/12/2019   Procedure: APPENDECTOMY LAPAROSCOPIC;  Surgeon: Joel Ebbing, MD;  Location: [facility] OR;  Service: General;  Laterality: N/A;   ORIF ULNAR FRACTURE Left    had screws and plate; had screws and plate removed 1914    No Known Allergies      Objective:  Physical Exam: VS: BP:(!) 142/76  HR: bpm  TEMP: ( )  RESP:   HT:5' 11 (180.3 cm)   WT:188 lb (85.3 kg)  BMI:26.23  Gen: NAD, speaks clearly, comfortable in exam room Respiratory: Normal respiratory effort on room air. No signs of distress Skin: No rashes, abrasions, or ecchymosis MSK: Right Shoulder: Inspection chronic defect of right proximal biceps.  There is some trace medial inferior winging of the scapula with shoulder abduction ROM: full  Strength: 5/5 in flexion, extension, abduction, IR and ER AC joint: NT Special tests: empty can negative Neuro: NVID   Assessment & Plan:   Incomplete tear of right rotator cuff - Joel Lopez is progressing well with full range of motion and no pain but still has some mild weakness in shoulder flexion and abduction.  However, this is still improved from his previous visit. - We did discuss the addition of scapular retraction exercises for stabilization of the rotator cuff with overhead movement. - He will avoid further overhead playing tennis until 2 weeks.  At that point he can try overhead movement with little to no resistance.  If he does not have any symptoms he can start with light repetition movements but I would not recommend  competition. - We will follow-up in 4 weeks to reevaluate and reultrasound.    Joel Bridges MD [facility] Sports Medicine Fellow

## 2023-10-30 ENCOUNTER — Ambulatory Visit: Admitting: Sports Medicine

## 2023-10-30 ENCOUNTER — Other Ambulatory Visit: Payer: Self-pay

## 2023-10-30 VITALS — BP 150/80 | Ht 71.0 in | Wt 188.0 lb

## 2023-10-30 DIAGNOSIS — S46011D Strain of muscle(s) and tendon(s) of the rotator cuff of right shoulder, subsequent encounter: Secondary | ICD-10-CM

## 2023-10-30 NOTE — Progress Notes (Addendum)
  Joel Lopez - 73 y.o. male MRN [id number]  Date of birth: 18-Jun-1950  SUBJECTIVE:  Including CC & ROS.  No chief complaint on file. Patient is here for follow-up evaluation of his right shoulder.  He has continued to his home exercise with increase strength and range of motion.  He notes that he still notices some tenderness towards his lateral/posterior humerus.  He has not had any pain.  He has been playing tennis focusing on ground strokes and avoiding elevated shoulder movements.  He has not tried over the head hits in a couple months according to him.  He states that he is feeling stronger.  He is not taking any anti-inflammatories at this time.   HISTORY: Past Medical, Surgical, Social, and Family History Reviewed & Updated per EMR.   Pertinent Historical Findings include:  PHYSICAL EXAM:  VS: BP:(!) 150/80  HR: bpm  TEMP: ( )  RESP:   HT:5' 11 (180.3 cm)   WT:188 lb (85.3 kg)  BMI:26.23 PHYSICAL EXAM:  MSK: Right Shoulder: Inspection: No erythema, edema Palpation: No AC joint tenderness, no clavicular tenderness, no humeral head tenderness, no bicep tendon tenderness ROM: full flexion, abduction Strength: 5/5 in flexion, extension, abduction, IR and ER Special tests: empty can negative, speeds negative, Yergason's negative, drop arm negative, crossover negative Neuro: Sensation is equal bilaterally  Ultrasound Biceps Tendon SAX and LAX: visualized in bicipital groove w/ no hypoechoic changes Subscapularis tendon - viewed in SAX and LAX inserting into the inferior lesser tubercle of humerus. echogenics: no abnormal changes surrounding the tendon  AC joint - no narrowing, mild osteophyte formation present Supraspinatus tendon - viewed in SAX and LAX, tendon in tact, no obvious tears or hypoechoic changes noted today Infraspinatus - viewed in  SAX at the middle facet of the greater tubercle of humerus   Summary: Supraspinatus shows uniformity throughout with any  hyperechoic or hypoechoic changes.  Based on Ultrasound this appears to have healed.  Ultrasound and interpretation by Dr. Jilda and Helene NOVAK. Fields, MD  ASSESSMENT & PLAN: See problem based charting & AVS for pt instructions. Patient had good strength exam, and reassuring exam findings with his ultrasound.  At this point, he can return to overhead hitting with tennis although this should be done slowly and progressivley.  He should focus on overhead hits that are not posterior to his shoulder but anterior to his shoulder so he is not reaching back and overly rotating.  Patient can return as needed.

## 2023-11-08 ENCOUNTER — Ambulatory Visit: Payer: Self-pay | Admitting: *Deleted

## 2023-11-08 NOTE — Telephone Encounter (Signed)
 Left voicemail for patient to return call to office.

## 2023-11-08 NOTE — Telephone Encounter (Signed)
 Copied from CRM #[id number]. Topic: Clinical - Red Word Triage >> Nov 08, 2023 12:34 PM Harlene ORN wrote: Red Word that prompted transfer to Nurse Triage: dizzy and off balance for two weeks pulse rate is below 60 (always around 52-55) Reason for Disposition  [1] MODERATE dizziness (e.g., interferes with normal activities) AND [2] has NOT been evaluated by doctor (or NP/PA) for this  (Exception: Dizziness caused by heat exposure, sudden standing, or poor fluid intake.)  Answer Assessment - Initial Assessment Questions 1. DESCRIPTION: Describe your dizziness.     I'm dizzy and off balance for the last 2 weeks.    No shortness of breath or chest pain. 2. LIGHTHEADED: Do you feel lightheaded? (e.g., somewhat faint, woozy, weak upon standing)     Yes My BP has been low after playing tennis.   My pulse is low now and staying low.   My normal is 65-70.    It's been low in the 50's  lately. 3. VERTIGO: Do you feel like either you or the room is spinning or tilting? (i.e., vertigo)     No 4. SEVERITY: How bad is it?  Do you feel like you are going to faint? Can you stand and walk?     I feel off balance and dizzy 5. ONSET:  When did the dizziness begin?     2 weeks now 6. AGGRAVATING FACTORS: Does anything make it worse? (e.g., standing, change in head position)     I have to get up slowly from a chair and getting out of bed. 7. HEART RATE: Can you tell me your heart rate? How many beats in 15 seconds?  (Note: Not all patients can do this.)       See above 8. CAUSE: What do you think is causing the dizziness? (e.g., decreased fluids or food, diarrhea, emotional distress, heat exposure, new medicine, sudden standing, vomiting; unknown)     I don't know 9. RECURRENT SYMPTOM: Have you had dizziness before? If Yes, ask: When was the last time? What happened that time?     No 10. OTHER SYMPTOMS: Do you have any other symptoms? (e.g., fever, chest pain, vomiting, diarrhea,  bleeding)       No 11. PREGNANCY: Is there any chance you are pregnant? When was your last menstrual period?       N/A  Protocols used: Dizziness - Lightheadedness-A-AH There are no appts available with any of the providers until Aug.    He is asking to be worked in if possible.   Number in chart is correct.    FYI Only or Action Required?: Action required by provider: request for appointment.  Patient was last seen in primary care on 06/11/2023 by Cleatus Arlyss RAMAN, MD.  Called Nurse Triage reporting Dizziness.for the last 2 weeks and pulse in 50s.   It's normally 65-80.    Symptoms began several weeks ago. 2 weeks now   Interventions attempted: Nothing.  Symptoms are: gradually worsening.  Triage Disposition: See Physician Within 24 Hours  Patient/caregiver understands and will follow disposition?: Yes

## 2023-11-09 ENCOUNTER — Telehealth: Payer: Self-pay | Admitting: Family Medicine

## 2023-11-09 NOTE — Telephone Encounter (Addendum)
 Spoke with patient and he is still experiencing some dizziness. But its somewhat better. He really only wanted to see Dr. Cleatus. I was able to get him scheduled to be seen on 7/28 at 3pm

## 2023-11-09 NOTE — Telephone Encounter (Signed)
 Closing encounter. Another encounter is open for patient issue

## 2023-11-09 NOTE — Telephone Encounter (Signed)
 Pt called back returning AV's call from yesterday regarding his recent dizziness. Pt requested call back, when possible. Wasn't able to edit/addend triage note. Call back # [phone]

## 2023-11-12 ENCOUNTER — Encounter: Payer: Self-pay | Admitting: Family Medicine

## 2023-11-12 ENCOUNTER — Ambulatory Visit (INDEPENDENT_AMBULATORY_CARE_PROVIDER_SITE_OTHER): Admitting: Family Medicine

## 2023-11-12 VITALS — BP 124/72 | HR 71 | Temp 98.6°F | Ht 71.0 in | Wt 189.8 lb

## 2023-11-12 DIAGNOSIS — R42 Dizziness and giddiness: Secondary | ICD-10-CM

## 2023-11-12 NOTE — Patient Instructions (Signed)
 Likely BPV.  Would try the home exercise and let me know if that isn't helping.

## 2023-11-12 NOTE — Progress Notes (Unsigned)
 Dizziness vs lightheaded vs off balance.  Noted sx getting out of bed.  No nausea.  Similar to but not as bad as prev vertigo.  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  That was about 2-3 weeks ago.  He check pulse and was in the 50s at the time.   He has been able to play tennis w/o CP.  No syncope.  Sx could happen with rolling over in bed.    Sx are better in the meantime.  Started getting better a few days ago.  Sx less severe now, noted less when getting out of bed.  He can have sx with head turning.    Episodes can last up to an hour, getting shorter now.  No numbness, no tingling, no weakness.  No ear ringing.    No URI sx recently.  No fevers, no chills.    Meds, vitals, and allergies reviewed.   ROS: Per HPI unless specifically indicated in ROS section

## 2023-11-13 DIAGNOSIS — Z0100 Encounter for examination of eyes and vision without abnormal findings: Secondary | ICD-10-CM | POA: Diagnosis not present

## 2023-11-14 NOTE — Assessment & Plan Note (Signed)
 Likely BPV.  Would try the home exercise and let me know if that isn't helping.  Pathophysiology discussed with patient.  He can update me as needed.

## 2023-12-12 ENCOUNTER — Encounter (HOSPITAL_COMMUNITY): Payer: Self-pay

## 2023-12-12 ENCOUNTER — Ambulatory Visit (INDEPENDENT_AMBULATORY_CARE_PROVIDER_SITE_OTHER)

## 2023-12-12 ENCOUNTER — Ambulatory Visit (HOSPITAL_COMMUNITY)
Admission: EM | Admit: 2023-12-12 | Discharge: 2023-12-12 | Disposition: A | Discharge status: Home / Self Care | Attending: Physician Assistant | Admitting: Physician Assistant

## 2023-12-12 ENCOUNTER — Ambulatory Visit (HOSPITAL_COMMUNITY): Payer: Self-pay | Admitting: Physician Assistant

## 2023-12-12 ENCOUNTER — Ambulatory Visit: Payer: Self-pay

## 2023-12-12 DIAGNOSIS — M25441 Effusion, right hand: Secondary | ICD-10-CM | POA: Diagnosis not present

## 2023-12-12 DIAGNOSIS — L089 Local infection of the skin and subcutaneous tissue, unspecified: Secondary | ICD-10-CM | POA: Diagnosis not present

## 2023-12-12 DIAGNOSIS — M7989 Other specified soft tissue disorders: Secondary | ICD-10-CM | POA: Diagnosis not present

## 2023-12-12 DIAGNOSIS — M79644 Pain in right finger(s): Secondary | ICD-10-CM | POA: Diagnosis not present

## 2023-12-12 MED ORDER — CEPHALEXIN 500 MG PO CAPS: 500.0000 mg | ORAL_CAPSULE | Freq: Four times a day (QID) | ORAL | 0 refills | Status: DC

## 2023-12-12 NOTE — ED Triage Notes (Signed)
 Pt states stung by a bee to rt pinky finger on Sunday with redness and swelling.

## 2023-12-12 NOTE — Discharge Instructions (Signed)
 I did not see anything on your x-ray and we will contact you if the radiologist sees something.  Keep this area clean with soap and water .  Start cephalexin  4 times daily for 1 week to cover for infection.  Follow-up with hand specialist if your symptoms persist; call to schedule an appointment.  If anything worsens and you have increasing pain, redness, swelling, fever, nausea, vomiting, numbness or tingling in the hand you need to be seen immediately.

## 2023-12-12 NOTE — Telephone Encounter (Signed)
 FYI Only or Action Required?: FYI only for provider.  Patient was last seen in primary care on 11/12/2023 by Cleatus Arlyss RAMAN, MD.  Called Nurse Triage reporting Insect Bite.  Symptoms began several days ago.  Interventions attempted: OTC medications: Aleve .  Symptoms are: unchanged.  Triage Disposition: See Physician Within 24 Hours  Patient/caregiver understands and will follow disposition?: Yes   Copied from CRM #[id number]. Topic: Clinical - Red Word Triage >> Dec 12, 2023  2:58 PM Gennette ORN wrote: Red Word that prompted transfer to Nurse Triage: Patient has a bee sting on his pinky finger by joint area this happened Sunday. It is very swollen. Reason for Disposition  [1] Red or very tender (to touch) area AND [2] started over 24 hours after the sting  Answer Assessment - Initial Assessment Questions Aleve , icing it. Denies any drainage. Patient states it looks and feels infected. No appointments available in office within available timeframe, advised patient to UC for further evaluation.    1. TYPE: What type of sting was it? (e.g., bee, yellow jacket, unknown)      Bee, type unknown  2. ONSET: When did it occur?      Sunday  3. LOCATION: Where is the sting located?  How many stings?     Pinky finger by the joint  4. SWELLING SIZE: How big is the swelling? (e.g., inches or cm)     Maybe a nickel   5. REDNESS: Is the area red or pink? If Yes, ask: What size is area of redness? (e.g., inches or cm). When did the redness start?     Red   6. PAIN: Is there any pain? If Yes, ask: How bad is it?  (Scale 0-10; or none, mild, moderate, severe)     Painful to move finger  7. ITCHING: Is there any itching? If Yes, ask: How bad is it?      No  8. RESPIRATORY DISTRESS: Describe your breathing.     Breathing normally  9. PRIOR REACTIONS: Have you had any severe allergic reactions to stings in the past? If Yes, ask: What happened?     No   10.  OTHER SYMPTOMS: Do you have any other symptoms? (e.g., abdomen pain, face or tongue swelling, new rash elsewhere, vomiting)       No  Protocols used: Bee or Yellow Jacket Sting-A-AH

## 2023-12-12 NOTE — Telephone Encounter (Signed)
 Agree with UC.  I am not in clinic today to see patient.

## 2023-12-12 NOTE — ED Provider Notes (Signed)
 [facility]    CSN: [id number] Arrival date & time: 12/12/23  1525      History   Chief Complaint Chief Complaint  Patient presents with   Insect Bite    HPI Joel Lopez is a 73 y.o. male.   Patient presents today for evaluation of redness and discomfort in his finger after being stung by an insect.  He reports that it was a yellowjacket and he initially had pain and swelling.  He has been using over-the-counter medication such as ibuprofen as well as ice to help manage the discomfort.  It has gradually been improving but he is concerned that there is a retained foreign body (the stinger) as there is a area that is particularly tender and feels abnormal.  He is right-handed.  He denies any numbness or paresthesias in the hand.  He denies any recent antibiotics or recurrent skin infections.  He denies any systemic symptoms including fever, nausea, vomiting, diarrhea, weakness.  He denies any significant pain and reports the discomfort is minimal rated 1 on a 0-10 pain scale.    Past Medical History:  Diagnosis Date   Anemia of other chronic disease    normal blood blood counts since 2007   Arthritis    GERD (gastroesophageal reflux disease)    Hamartomatous polyp of large intestine (HCC)    Hemorrhoids    History of DVT in adulthood    Ileus (HCC)    Internal hemorrhoids    Labyrinthitis    Obstructive sleep apnea    study '07 - AHI 17, lowest O2 sat 86%. not using CPAP, couldn't tolerate CPAP   Plantar fasciitis    Right rotator cuff tear 2013   90% normal function.   Rupture long head biceps tendon 2014   right. 90% normal function    Patient Active Problem List   Diagnosis Date Noted   Health care maintenance 06/13/2023   Hyperlipidemia 06/13/2023   Tinea cruris 09/27/2022   Medicare annual wellness visit, subsequent 05/28/2022   Paresthesia 05/28/2022   Vertigo 04/27/2021   History of DVT (deep vein thrombosis) 08/27/2019   History of  appendicitis 08/12/2019   Headache 11/18/2015   Advance care planning 01/15/2015   Incomplete tear of right rotator cuff 08/17/2011   GERD 09/18/2007   Obstructive sleep apnea 04/17/2007    Past Surgical History:  Procedure Laterality Date   DENTAL SURGERY     implant   INGUINAL HERNIA REPAIR Left 04/26/2018   Procedure: LEFT INGUINAL HERNIA REPAIR WITH MESH;  Surgeon: Curvin Deward MOULD, MD;  Location: [facility] OR;  Service: General;  Laterality: Left;   INSERTION OF MESH Left 04/26/2018   Procedure: INSERTION OF MESH;  Surgeon: Curvin Deward MOULD, MD;  Location: [facility] OR;  Service: General;  Laterality: Left;   LAPAROSCOPIC APPENDECTOMY N/A 08/12/2019   Procedure: APPENDECTOMY LAPAROSCOPIC;  Surgeon: Belinda Cough, MD;  Location: [facility] OR;  Service: General;  Laterality: N/A;   ORIF ULNAR FRACTURE Left    had screws and plate; had screws and plate removed 8013       Home Medications    Prior to Admission medications   Medication Sig Start Date End Date Taking? Authorizing Provider  cephALEXin  (KEFLEX ) 500 MG capsule Take 1 capsule (500 mg total) by mouth 4 (four) times daily. 12/12/23  Yes Jaisha Villacres K, PA-C  aspirin EC 81 MG tablet Take 81 mg by mouth daily. Swallow whole.    [provider]  atorvastatin  (LIPITOR) 10  MG tablet Take 1 tablet (10 mg total) by mouth daily. 06/20/23   Cleatus Arlyss RAMAN, MD  Multiple Vitamin (MULTIVITAMIN) tablet Take 1 tablet by mouth daily.    [provider]    Family History Family History  Problem Relation Age of Onset   Colon cancer Maternal Aunt    Esophageal cancer Maternal Uncle    Leukemia Mother    Diabetes Neg Hx    Heart disease Neg Hx    Hyperlipidemia Neg Hx    Stroke Neg Hx    Prostate cancer Neg Hx    Rectal cancer Neg Hx    Stomach cancer Neg Hx     Social History Social History   Tobacco Use   Smoking status: Never   Smokeless tobacco: Never  Vaping Use   Vaping status: Never Used  Substance Use Topics    Alcohol use: Yes    Alcohol/week: 0.0 standard drinks of alcohol    Comment: social   Drug use: No     Allergies   Patient has no known allergies.   Review of Systems Review of Systems  Constitutional:  Negative for activity change, appetite change, fatigue and fever.  Gastrointestinal:  Negative for nausea and vomiting.  Musculoskeletal:  Positive for joint swelling. Negative for arthralgias and myalgias.  Skin:  Positive for wound. Negative for color change.  Neurological:  Negative for weakness and numbness.     Physical Exam Triage Vital Signs ED Triage Vitals  Encounter Vitals Group     BP 12/12/23 1653 (!) 150/88     Girls Systolic BP Percentile --      Girls Diastolic BP Percentile --      Boys Systolic BP Percentile --      Boys Diastolic BP Percentile --      Pulse Rate 12/12/23 1653 62     Resp 12/12/23 1653 18     Temp 12/12/23 1653 97.8 F (36.6 C)     Temp Source 12/12/23 1653 Oral     SpO2 12/12/23 1653 99 %     Weight --      Height --      Head Circumference --      Peak Flow --      Pain Score 12/12/23 1654 1     Pain Loc --      Pain Education --      Exclude from Growth Chart --    No data found.  Updated Vital Signs BP (!) 150/88 (BP Location: Left Arm)   Pulse 62   Temp 97.8 F (36.6 C) (Oral)   Resp 18   SpO2 99%   Visual Acuity Right Eye Distance:   Left Eye Distance:   Bilateral Distance:    Right Eye Near:   Left Eye Near:    Bilateral Near:     Physical Exam Vitals reviewed.  Constitutional:      General: He is awake.     Appearance: Normal appearance. He is well-developed. He is not ill-appearing.     Comments: Very pleasant male appears stated age no acute distress sitting comfortable in exam room  HENT:     Head: Normocephalic and atraumatic.  Cardiovascular:     Rate and Rhythm: Normal rate and regular rhythm.     Heart sounds: Normal heart sounds, S1 normal and S2 normal. No murmur heard.    Comments: Capillary  fill within 2 seconds right fingers Pulmonary:     Effort: Pulmonary effort is  normal.     Breath sounds: Normal breath sounds. No stridor. No wheezing, rhonchi or rales.     Comments: Clear to auscultation bilaterally Musculoskeletal:     Right hand: Swelling present. No bony tenderness. Normal strength. Normal sensation. There is no disruption of two-point discrimination.     Comments: Right fifth finger: Swelling of dorsal PIP joint with associated erythema over medial portion of finger.  100% fist formation.  Fingers neurovascular intact.  Small area of induration at center of erythema.  Neurological:     Mental Status: He is alert.  Psychiatric:        Behavior: Behavior is cooperative.      UC Treatments / Results  Labs (all labs ordered are listed, but only abnormal results are displayed) Labs Reviewed - No data to display  EKG   Radiology DG Finger Little Right Result Date: 12/12/2023 CLINICAL DATA:  Pain and swelling bee sting EXAM: RIGHT LITTLE FINGER 2+V COMPARISON:  None Available. FINDINGS: No fracture or malalignment. Soft tissue swelling without radiopaque foreign body or emphysema. No osseous destructive change IMPRESSION: Soft tissue swelling.  No acute osseous abnormality Electronically Signed   By: Luke Bun M.D.   On: 12/12/2023 18:01    Procedures Procedures (including critical care time)  Medications Ordered in UC Medications - No data to display  Initial Impression / Assessment and Plan / UC Course  I have reviewed the triage vital signs and the nursing notes.  Pertinent labs & imaging results that were available during my care of the patient were reviewed by me and considered in my medical decision making (see chart for details).     Patient is well-appearing, afebrile, nontoxic, nontachycardic.  Hand is neurovascularly intact.  Patient was concern for retained stinger and there was an area of induration.  Area was cleaned with alcohol and then  introduced needle at approximately 10 degrees to penetrate epidermis without evidence of retained foreign body.  X-ray was obtained that showed no radiopaque foreign body.  Will cover for bacterial infection given ongoing redness and swelling with cephalexin  500 mg 4 times daily for 1 week.  No indication for dose adjustment based on metabolic panel from 06/11/2023 with creatinine of 0.95 and calculated creatinine clearance of 86.48 mL/min.  He was encouraged to use warm compresses to encourage any retained foreign body or infection to come to the surface and if he does notice an area of purulence or fluctuance he can return for reevaluation.  We discussed also that if his symptoms persist and are not improving he can follow-up with a hand specialist and was given the contact information for provider on-call with instruction to call to schedule an appointment if symptoms do not improve.  If anything worsens and he has fever, increasing erythema or pain, nausea/vomiting, numbness or tingling in his finger he needs to be seen emergently.  Strict return precautions given.  All questions answered to patient satisfaction.  Final Clinical Impressions(s) / UC Diagnoses   Final diagnoses:  Swelling of finger joint of right hand  Skin infection     Discharge Instructions      I did not see anything on your x-ray and we will contact you if the radiologist sees something.  Keep this area clean with soap and water .  Start cephalexin  4 times daily for 1 week to cover for infection.  Follow-up with hand specialist if your symptoms persist; call to schedule an appointment.  If anything worsens and you have increasing  pain, redness, swelling, fever, nausea, vomiting, numbness or tingling in the hand you need to be seen immediately.     ED Prescriptions     Medication Sig Dispense Auth. Provider   cephALEXin  (KEFLEX ) 500 MG capsule Take 1 capsule (500 mg total) by mouth 4 (four) times daily. 28 capsule Hadja Harral,  Jumanah Hynson K, PA-C      PDMP not reviewed this encounter.   Sherrell Rocky POUR, PA-C 12/12/23 1808

## 2024-01-25 ENCOUNTER — Encounter: Payer: Self-pay | Admitting: Internal Medicine

## 2024-02-26 DIAGNOSIS — N401 Enlarged prostate with lower urinary tract symptoms: Secondary | ICD-10-CM | POA: Diagnosis not present

## 2024-02-26 DIAGNOSIS — R3915 Urgency of urination: Secondary | ICD-10-CM | POA: Diagnosis not present

## 2024-02-26 DIAGNOSIS — Z87448 Personal history of other diseases of urinary system: Secondary | ICD-10-CM | POA: Diagnosis not present

## 2024-02-26 DIAGNOSIS — N5201 Erectile dysfunction due to arterial insufficiency: Secondary | ICD-10-CM | POA: Diagnosis not present

## 2024-02-26 DIAGNOSIS — Z125 Encounter for screening for malignant neoplasm of prostate: Secondary | ICD-10-CM | POA: Diagnosis not present

## 2024-02-26 DIAGNOSIS — R399 Unspecified symptoms and signs involving the genitourinary system: Secondary | ICD-10-CM | POA: Diagnosis not present

## 2024-03-10 ENCOUNTER — Encounter: Payer: Self-pay | Admitting: Internal Medicine

## 2024-03-10 ENCOUNTER — Ambulatory Visit (AMBULATORY_SURGERY_CENTER)

## 2024-03-10 VITALS — Ht 71.0 in | Wt 192.0 lb

## 2024-03-10 DIAGNOSIS — Z860100 Personal history of colon polyps, unspecified: Secondary | ICD-10-CM

## 2024-03-10 MED ORDER — NA SULFATE-K SULFATE-MG SULF 17.5-3.13-1.6 GM/177ML PO SOLN: 1.0000 | Freq: Once | ORAL | 0 refills | Status: AC

## 2024-03-10 NOTE — Progress Notes (Signed)
 Pre visit completed via phone call; Patient verified name, DOB, and address; No egg or soy allergy known to patient;  No issues known to pt with past sedation with any surgeries or procedures; Patient denies ever being told they had issues or difficulty with intubation;  No FH of Malignant Hyperthermia; Pt is not on diet pills; Pt is not on home 02;  Pt is not on blood thinners; Pt denies issues with constipation;  No A fib or A flutter; Have any cardiac testing pending--NO Insurance verified during PV appt--- [employer] Medicare Pt can ambulate without assistance;  Pt denies use of chewing tobacco; Discussed diabetic/weight loss medication holds; Discussed NSAID holds; Checked BMI to be less than 50; Pt instructed to use Singlecare.com or GoodRx for a price reduction on prep;  Patient's chart reviewed by Norleen Schillings CNRA prior to previsit and patient appropriate for the [facility]; Pre visit completed and red dot placed by patient's name on their procedure day (on provider's schedule);  Instructions sent to MyChart as well as printed and given to the patient at time of PV appt;

## 2024-03-20 ENCOUNTER — Encounter (HOSPITAL_COMMUNITY): Payer: Self-pay | Admitting: General Surgery

## 2024-03-27 ENCOUNTER — Encounter: Payer: Self-pay | Admitting: Internal Medicine

## 2024-03-27 ENCOUNTER — Ambulatory Visit: Admitting: Internal Medicine

## 2024-03-27 VITALS — BP 95/53 | HR 72 | Temp 97.5°F | Resp 17 | Ht 71.0 in | Wt 192.0 lb

## 2024-03-27 DIAGNOSIS — G4733 Obstructive sleep apnea (adult) (pediatric): Secondary | ICD-10-CM | POA: Diagnosis not present

## 2024-03-27 DIAGNOSIS — Z860101 Personal history of adenomatous and serrated colon polyps: Secondary | ICD-10-CM | POA: Diagnosis not present

## 2024-03-27 DIAGNOSIS — K6389 Other specified diseases of intestine: Secondary | ICD-10-CM

## 2024-03-27 DIAGNOSIS — Z1211 Encounter for screening for malignant neoplasm of colon: Secondary | ICD-10-CM | POA: Diagnosis not present

## 2024-03-27 DIAGNOSIS — Z860100 Personal history of colon polyps, unspecified: Secondary | ICD-10-CM | POA: Diagnosis not present

## 2024-03-27 DIAGNOSIS — D125 Benign neoplasm of sigmoid colon: Secondary | ICD-10-CM

## 2024-03-27 MED ORDER — SODIUM CHLORIDE 0.9 % IV SOLN: 500.0000 mL | INTRAVENOUS | Status: DC

## 2024-03-27 NOTE — Progress Notes (Signed)
 Report to PACU, RN, vss, BBS= Clear.

## 2024-03-27 NOTE — Progress Notes (Signed)
 Pt's states no medical or surgical changes since previsit or office visit.

## 2024-03-27 NOTE — Progress Notes (Signed)
 GASTROENTEROLOGY PROCEDURE H&P NOTE   Primary Care Physician: Cleatus Arlyss RAMAN, MD    Reason for Procedure:   Hx of polyp (SSP)  Plan:    colonoscopy  Patient is appropriate for endoscopic procedure(s) in the ambulatory ([facility]) setting.  The nature of the procedure, as well as the risks, benefits, and alternatives were carefully and thoroughly reviewed with the patient. Ample time for discussion and questions allowed.  All questions were answered. The patient understood, was satisfied, and agreed with the plan to proceed.    HPI: Joel Lopez is a 73 y.o. male who presents for colonoscopy.  Medical history as below.  Tolerated the prep.  No recent chest pain or shortness of breath.  No abdominal pain today.  Past Medical History:  Diagnosis Date   Anemia of other chronic disease    normal blood blood counts since 2007   Arthritis    GERD (gastroesophageal reflux disease)    Hamartomatous polyp of large intestine (HCC)    Hemorrhoids    History of DVT in adulthood 2021   LEFT leg- and again in 2022   Ileus [facility])    Internal hemorrhoids    Labyrinthitis    Obstructive sleep apnea    study '07 - AHI 17, lowest O2 sat 86%. not using CPAP, couldn't tolerate CPAP   Plantar fasciitis    Right rotator cuff tear 2013   90% normal function.   Rupture long head biceps tendon 2014   right. 90% normal function    Past Surgical History:  Procedure Laterality Date   DENTAL SURGERY     implant   INGUINAL HERNIA REPAIR Left 04/26/2018   Procedure: LEFT INGUINAL HERNIA REPAIR WITH MESH;  Surgeon: Curvin Deward MOULD, MD;  Location: [facility] OR;  Service: General;  Laterality: Left;   LAPAROSCOPIC APPENDECTOMY N/A 08/12/2019   Procedure: APPENDECTOMY LAPAROSCOPIC;  Surgeon: Belinda Cough, MD;  Location: [facility] OR;  Service: General;  Laterality: N/A;   ORIF ULNAR FRACTURE Left 1986   had screws and plate; had screws and plate removed 8013    Prior to Admission medications  Medication Sig  Start Date End Date Taking? Authorizing Provider  aspirin EC 81 MG tablet Take 81 mg by mouth daily. Swallow whole.   Yes [provider]  atorvastatin  (LIPITOR) 10 MG tablet Take 1 tablet (10 mg total) by mouth daily. 06/20/23  Yes Cleatus Arlyss RAMAN, MD  Multiple Vitamin (MULTIVITAMIN) tablet Take 1 tablet by mouth daily.   Yes [provider]    Current Outpatient Medications  Medication Sig Dispense Refill   aspirin EC 81 MG tablet Take 81 mg by mouth daily. Swallow whole.     atorvastatin  (LIPITOR) 10 MG tablet Take 1 tablet (10 mg total) by mouth daily. 90 tablet 3   Multiple Vitamin (MULTIVITAMIN) tablet Take 1 tablet by mouth daily.     Current Facility-Administered Medications  Medication Dose Route Frequency Provider Last Rate Last Admin   0.9 %  sodium chloride  infusion  500 mL Intravenous Continuous Shabazz Mckey, Gordy HERO, MD        Allergies as of 03/27/2024   (No Known Allergies)    Family History  Problem Relation Age of Onset   Leukemia Mother 39   Colon cancer Maternal Aunt 63   Esophageal cancer Maternal Uncle 52   Diabetes Neg Hx    Heart disease Neg Hx    Hyperlipidemia Neg Hx    Stroke Neg Hx    Prostate  cancer Neg Hx    Rectal cancer Neg Hx    Stomach cancer Neg Hx    Colon polyps Neg Hx     Social History   Socioeconomic History   Marital status: Divorced    Spouse name: Not on file   Number of children: 3   Years of education: 12   Highest education level: 12th grade  Occupational History   Occupation: METER TECH    Employer: [employer]  Tobacco Use   Smoking status: Never   Smokeless tobacco: Never  Vaping Use   Vaping status: Never Used  Substance and Sexual Activity   Alcohol use: Not Currently    Comment: social   Drug use: No   Sexual activity: Yes    Partners: Female  Other Topics Concern   Not on file  Social History Narrative   [employer].    Occupation: Health And Safety Inspector - metering. Married '72-'87, divorced;  '96-00, divorced.    Lives alone.    Pt has children: 2 sons - '72, '80; 1 dtr -'76; 9 grandchildren, 2 great grandchildren   Regular Exercise- yes, tennis 3 x a week. Resistance training. Discussed ACP and referred to [email or web address] for his consideration (Dec '14)    Enjoys playing tennis.    Social Drivers of Health   Tobacco Use: Low Risk (03/27/2024)   Patient History    Smoking Tobacco Use: Never    Smokeless Tobacco Use: Never    Passive Exposure: Not on file  Financial Resource Strain: Low Risk (11/11/2023)   Overall Financial Resource Strain (CARDIA)    Difficulty of Paying Living Expenses: Not hard at all  Food Insecurity: No Food Insecurity (11/11/2023)   Epic    Worried About Radiation Protection Practitioner of Food in the Last Year: Never true    Ran Out of Food in the Last Year: Never true  Transportation Needs: No Transportation Needs (11/11/2023)   Epic    Lack of Transportation (Medical): No    Lack of Transportation (Non-Medical): No  Physical Activity: Sufficiently Active (11/11/2023)   Exercise Vital Sign    Days of Exercise per Week: 4 days    Minutes of Exercise per Session: 60 min  Stress: No Stress Concern Present (11/11/2023)   [employer] of Occupational Health - Occupational Stress Questionnaire    Feeling of Stress: Not at all  Social Connections: Moderately Isolated (11/11/2023)   Social Connection and Isolation Panel    Frequency of Communication with Friends and Family: More than three times a week    Frequency of Social Gatherings with Friends and Family: Three times a week    Attends Religious Services: Never    Active Member of Clubs or Organizations: Yes    Attends Banker Meetings: More than 4 times per year    Marital Status: Divorced  Intimate Partner Violence: Not At Risk (05/18/2023)   Humiliation, Afraid, Rape, and Kick questionnaire    Fear of Current or Ex-Partner: No    Emotionally Abused: No    Physically Abused: No     Sexually Abused: No  Depression (PHQ2-9): High Risk (11/12/2023)   Depression (PHQ2-9)    PHQ-2 Score: 11  Alcohol Screen: Low Risk (11/11/2023)   Alcohol Screen    Last Alcohol Screening Score (AUDIT): 0  Housing: Low Risk (11/11/2023)   Epic    Unable to Pay for Housing in the Last Year: No    Number of Times Moved in the Last Year: 0  Homeless in the Last Year: No  Utilities: Not At Risk (05/18/2023)   AHC Utilities    Threatened with loss of utilities: No  Health Literacy: Adequate Health Literacy (05/18/2023)   B1300 Health Literacy    Frequency of need for help with medical instructions: Never    Physical Exam: Vital signs in last 24 hours: @BP  134/66   Pulse 78   Temp (!) 97.5 F (36.4 C) (Temporal)   Ht 5' 11 (1.803 m)   Wt 192 lb (87.1 kg)   SpO2 99%   BMI 26.78 kg/m  GEN: NAD EYE: Sclerae anicteric ENT: MMM CV: Non-tachycardic Pulm: CTA b/l GI: Soft, NT/ND NEURO:  Alert & Oriented x 3   Gordy Starch, MD [facility]  03/27/2024 1:34 PM

## 2024-03-27 NOTE — Patient Instructions (Addendum)
-   Resume previous diet.  - Continue present medications.  - 1 polyp removed and sent to pathology  - Await pathology results.  - Repeat colonoscopy is recommended for     surveillance. The colonoscopy date will be     determined after pathology results from today's     exam become available for review.  YOU HAD AN ENDOSCOPIC PROCEDURE TODAY AT THE [facility]:   Refer to the procedure report that was given to you for any specific questions about what was found during the examination.  If the procedure report does not answer your questions, please call your gastroenterologist to clarify.  If you requested that your care partner not be given the details of your procedure findings, then the procedure report has been included in a sealed envelope for you to review at your convenience later.  YOU SHOULD EXPECT: Some feelings of bloating in the abdomen. Passage of more gas than usual.  Walking can help get rid of the air that was put into your GI tract during the procedure and reduce the bloating. If you had a lower endoscopy (such as a colonoscopy or flexible sigmoidoscopy) you may notice spotting of blood in your stool or on the toilet paper. If you underwent a bowel prep for your procedure, you may not have a normal bowel movement for a few days.  Please Note:  You might notice some irritation and congestion in your nose or some drainage.  This is from the oxygen used during your procedure.  There is no need for concern and it should clear up in a day or so.  SYMPTOMS TO REPORT IMMEDIATELY:  Following lower endoscopy (colonoscopy or flexible sigmoidoscopy):  Excessive amounts of blood in the stool  Significant tenderness or worsening of abdominal pains  Swelling of the abdomen that is new, acute  Fever of 100F or higher   For urgent or emergent issues, a gastroenterologist can be reached at any hour by calling [phone]. Do not use MyChart messaging for urgent concerns.     DIET:  We do recommend a small meal at first, but then you may proceed to your regular diet.  Drink plenty of fluids but you should avoid alcoholic beverages for 24 hours.  ACTIVITY:  You should plan to take it easy for the rest of today and you should NOT DRIVE or use heavy machinery until tomorrow (because of the sedation medicines used during the test).    FOLLOW UP: Our staff will call the number listed on your records the next business day following your procedure.  We will call around 7:15- 8:00 am to check on you and address any questions or concerns that you may have regarding the information given to you following your procedure. If we do not reach you, we will leave a message.     If any biopsies were taken you will be contacted by phone or by letter within the next 1-3 weeks.  Please call us  at [phone] if you have not heard about the biopsies in 3 weeks.    SIGNATURES/CONFIDENTIALITY: You and/or your care partner have signed paperwork which will be entered into your electronic medical record.  These signatures attest to the fact that that the information above on your After Visit Summary has been reviewed and is understood.  Full responsibility of the confidentiality of this discharge information lies with you and/or your care-partner.

## 2024-03-27 NOTE — Progress Notes (Signed)
 Called to room to assist during endoscopic procedure.  Patient ID and intended procedure confirmed with present staff. Received instructions for my participation in the procedure from the performing physician.

## 2024-03-27 NOTE — Op Note (Signed)
 [facility] Patient Name: Joel Lopez Procedure Date: 03/27/2024 1:34 PM MRN: [id number] Endoscopist: Gordy CHRISTELLA Starch , MD, [id number] Age: 73 Referring MD:  Date of Birth: [birth year] Gender: Male Account #: [id number] Procedure:                Colonoscopy Indications:              High risk colon cancer surveillance: Personal                            history of sessile serrated colon polyp (less than                            10 mm in size) with no dysplasia, Last colonoscopy:                            October 2019 (SSP x 1) Medicines:                Monitored Anesthesia Care Procedure:                Pre-Anesthesia Assessment:                           - Prior to the procedure, a History and Physical                            was performed, and patient medications and                            allergies were reviewed. The patient's tolerance of                            previous anesthesia was also reviewed. The risks                            and benefits of the procedure and the sedation                            options and risks were discussed with the patient.                            All questions were answered, and informed consent                            was obtained. Prior Anticoagulants: The patient has                            taken no anticoagulant or antiplatelet agents. ASA                            Grade Assessment: II - A patient with mild systemic                            disease. After reviewing the risks and benefits,  the patient was deemed in satisfactory condition to                            undergo the procedure.                           After obtaining informed consent, the colonoscope                            was passed under direct vision. Throughout the                            procedure, the patient's blood pressure, pulse, and                            oxygen saturations were monitored  continuously. The                            Olympus CF-HQ190L ([id number]) Colonoscope was                            introduced through the anus and advanced to the                            cecum, identified by appendiceal orifice and                            ileocecal valve. The colonoscopy was performed                            without difficulty. The patient tolerated the                            procedure well. The quality of the bowel                            preparation was excellent. The ileocecal valve,                            appendiceal orifice, and rectum were photographed. Scope In: 1:41:32 PM Scope Out: 1:53:22 PM Scope Withdrawal Time: 0 hours 8 minutes 21 seconds  Total Procedure Duration: 0 hours 11 minutes 50 seconds  Findings:                 The digital rectal exam was normal.                           A 4 mm polyp was found in the sigmoid colon. The                            polyp was sessile. The polyp was removed with a                            cold snare. Resection and retrieval were complete.  The exam was otherwise without abnormality on                            direct and retroflexion views. Complications:            No immediate complications. Estimated Blood Loss:     Estimated blood loss: none. Impression:               - One 4 mm polyp in the sigmoid colon, removed with                            a cold snare. Resected and retrieved.                           - The examination was otherwise normal on direct                            and retroflexion views. Recommendation:           - Patient has a contact number available for                            emergencies. The signs and symptoms of potential                            delayed complications were discussed with the                            patient. Return to normal activities tomorrow.                            Written discharge instructions were provided  to the                            patient.                           - Resume previous diet.                           - Continue present medications.                           - Await pathology results.                           - Repeat colonoscopy is recommended for                            surveillance. The colonoscopy date will be                            determined after pathology results from today's                            exam become available for review. Gordy CHRISTELLA Starch, MD 03/27/2024 1:56:00 PM This report has been  signed electronically.

## 2024-03-28 ENCOUNTER — Telehealth: Payer: Self-pay

## 2024-03-28 NOTE — Telephone Encounter (Signed)
 No answer on follow-up call. Left VM for pt.

## 2024-04-01 LAB — SURGICAL PATHOLOGY

## 2024-04-02 ENCOUNTER — Ambulatory Visit: Payer: Self-pay | Admitting: Internal Medicine

## 2024-05-20 ENCOUNTER — Ambulatory Visit: Payer: Medicare HMO

## 2024-05-22 ENCOUNTER — Ambulatory Visit

## 2024-05-26 ENCOUNTER — Ambulatory Visit

## 2024-07-24 ENCOUNTER — Ambulatory Visit
